# Patient Record
Sex: Female | Born: 1995 | Race: White | Hispanic: No | Marital: Married | Smoking: Current some day smoker
Health system: Southern US, Community
[De-identification: ages and names within clinical notes are randomized; demographics above are authoritative.]

## PROBLEM LIST (undated history)

## (undated) ENCOUNTER — Inpatient Hospital Stay: Payer: Self-pay

## (undated) ENCOUNTER — Ambulatory Visit: Admission: EM | Source: Home / Self Care

## (undated) DIAGNOSIS — F1911 Other psychoactive substance abuse, in remission: Secondary | ICD-10-CM

## (undated) DIAGNOSIS — R519 Headache, unspecified: Secondary | ICD-10-CM

## (undated) DIAGNOSIS — J45909 Unspecified asthma, uncomplicated: Secondary | ICD-10-CM

## (undated) DIAGNOSIS — K219 Gastro-esophageal reflux disease without esophagitis: Secondary | ICD-10-CM

## (undated) DIAGNOSIS — R51 Headache: Secondary | ICD-10-CM

## (undated) DIAGNOSIS — F988 Other specified behavioral and emotional disorders with onset usually occurring in childhood and adolescence: Secondary | ICD-10-CM

## (undated) DIAGNOSIS — D649 Anemia, unspecified: Secondary | ICD-10-CM

---
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
## 2014-01-08 ENCOUNTER — Emergency Department: Payer: Self-pay | Admitting: Emergency Medicine

## 2014-01-08 LAB — URINALYSIS, COMPLETE
BLOOD: NEGATIVE
Bilirubin,UR: NEGATIVE
Glucose,UR: NEGATIVE mg/dL (ref 0–75)
Ketone: NEGATIVE
Leukocyte Esterase: NEGATIVE
Nitrite: NEGATIVE
Ph: 7 (ref 4.5–8.0)
Protein: NEGATIVE
RBC,UR: NONE SEEN /HPF (ref 0–5)
SPECIFIC GRAVITY: 1.006 (ref 1.003–1.030)
Squamous Epithelial: <1
WBC UR: 1 /HPF (ref 0–5)

## 2014-01-08 LAB — WET PREP, GENITAL

## 2014-01-09 LAB — GC/CHLAMYDIA PROBE AMP

## 2014-08-24 ENCOUNTER — Emergency Department: Payer: Self-pay | Admitting: Internal Medicine

## 2014-10-20 ENCOUNTER — Emergency Department
Admit: 2014-10-20 | Disposition: A | Discharge status: Home / Self Care | Payer: Self-pay | Admitting: Physician Assistant

## 2014-11-25 ENCOUNTER — Emergency Department: Payer: Managed Care, Other (non HMO)

## 2014-11-25 ENCOUNTER — Encounter: Payer: Self-pay | Admitting: *Deleted

## 2014-11-25 ENCOUNTER — Emergency Department
Admission: EM | Admit: 2014-11-25 | Discharge: 2014-11-25 | Disposition: A | Discharge status: Home / Self Care | Payer: Managed Care, Other (non HMO) | Attending: Emergency Medicine | Admitting: Emergency Medicine

## 2014-11-25 DIAGNOSIS — X58XXXA Exposure to other specified factors, initial encounter: Secondary | ICD-10-CM | POA: Diagnosis not present

## 2014-11-25 DIAGNOSIS — Y9389 Activity, other specified: Secondary | ICD-10-CM | POA: Diagnosis not present

## 2014-11-25 DIAGNOSIS — Z880 Allergy status to penicillin: Secondary | ICD-10-CM | POA: Insufficient documentation

## 2014-11-25 DIAGNOSIS — M546 Pain in thoracic spine: Secondary | ICD-10-CM | POA: Diagnosis present

## 2014-11-25 DIAGNOSIS — Y9289 Other specified places as the place of occurrence of the external cause: Secondary | ICD-10-CM | POA: Diagnosis not present

## 2014-11-25 DIAGNOSIS — Z720 Tobacco use: Secondary | ICD-10-CM | POA: Diagnosis not present

## 2014-11-25 DIAGNOSIS — J40 Bronchitis, not specified as acute or chronic: Secondary | ICD-10-CM | POA: Insufficient documentation

## 2014-11-25 DIAGNOSIS — Y998 Other external cause status: Secondary | ICD-10-CM | POA: Insufficient documentation

## 2014-11-25 DIAGNOSIS — M940 Chondrocostal junction syndrome [Tietze]: Secondary | ICD-10-CM | POA: Diagnosis not present

## 2014-11-25 DIAGNOSIS — S29012A Strain of muscle and tendon of back wall of thorax, initial encounter: Secondary | ICD-10-CM | POA: Insufficient documentation

## 2014-11-25 DIAGNOSIS — T148XXA Other injury of unspecified body region, initial encounter: Secondary | ICD-10-CM

## 2014-11-25 LAB — CBC WITH DIFFERENTIAL/PLATELET
Basophils Absolute: 0 10*3/uL (ref 0–0.1)
Basophils Relative: 0 %
EOS ABS: 0.1 10*3/uL (ref 0–0.7)
Eosinophils Relative: 1 %
HCT: 40.9 % (ref 35.0–47.0)
HEMOGLOBIN: 13.2 g/dL (ref 12.0–16.0)
Lymphocytes Relative: 29 %
Lymphs Abs: 3 10*3/uL (ref 1.0–3.6)
MCH: 27.6 pg (ref 26.0–34.0)
MCHC: 32.1 g/dL (ref 32.0–36.0)
MCV: 85.8 fL (ref 80.0–100.0)
MONO ABS: 0.9 10*3/uL (ref 0.2–0.9)
MONOS PCT: 9 %
NEUTROS PCT: 61 %
Neutro Abs: 6.3 10*3/uL (ref 1.4–6.5)
Platelets: 197 10*3/uL (ref 150–440)
RBC: 4.77 MIL/uL (ref 3.80–5.20)
RDW: 14.6 % — ABNORMAL HIGH (ref 11.5–14.5)
WBC: 10.4 10*3/uL (ref 3.6–11.0)

## 2014-11-25 LAB — URINALYSIS COMPLETE WITH MICROSCOPIC (ARMC ONLY)
BACTERIA UA: NONE SEEN
BILIRUBIN URINE: NEGATIVE
Glucose, UA: NEGATIVE mg/dL
Hgb urine dipstick: NEGATIVE
Ketones, ur: NEGATIVE mg/dL
Nitrite: NEGATIVE
PROTEIN: NEGATIVE mg/dL
Specific Gravity, Urine: 1.012 (ref 1.005–1.030)
pH: 6 (ref 5.0–8.0)

## 2014-11-25 LAB — COMPREHENSIVE METABOLIC PANEL
ALBUMIN: 4.4 g/dL (ref 3.5–5.0)
ALK PHOS: 67 U/L (ref 38–126)
ALT: 28 U/L (ref 14–54)
AST: 23 U/L (ref 15–41)
Anion gap: 7 (ref 5–15)
BILIRUBIN TOTAL: 0.2 mg/dL — AB (ref 0.3–1.2)
BUN: 12 mg/dL (ref 6–20)
CHLORIDE: 100 mmol/L — AB (ref 101–111)
CO2: 24 mmol/L (ref 22–32)
CREATININE: 0.83 mg/dL (ref 0.44–1.00)
Calcium: 8.5 mg/dL — ABNORMAL LOW (ref 8.9–10.3)
GLUCOSE: 75 mg/dL (ref 65–99)
POTASSIUM: 3.6 mmol/L (ref 3.5–5.1)
Sodium: 131 mmol/L — ABNORMAL LOW (ref 135–145)
Total Protein: 7.6 g/dL (ref 6.5–8.1)

## 2014-11-25 LAB — POCT PREGNANCY, URINE: Preg Test, Ur: NEGATIVE

## 2014-11-25 LAB — FIBRIN DERIVATIVES D-DIMER (ARMC ONLY): FIBRIN DERIVATIVES D-DIMER (ARMC): 164 (ref 0–499)

## 2014-11-25 LAB — TROPONIN I: Troponin I: <0.03 ng/mL (ref <0.031)

## 2014-11-25 MED ORDER — BENZONATATE 100 MG PO CAPS: 100.0000 mg | ORAL_CAPSULE | Freq: Three times a day (TID) | ORAL | Status: DC | PRN

## 2014-11-25 MED ORDER — PREDNISONE 20 MG PO TABS: 40.0000 mg | ORAL_TABLET | Freq: Every day | ORAL | Status: DC

## 2014-11-25 MED ORDER — ALBUTEROL SULFATE HFA 108 (90 BASE) MCG/ACT IN AERS: 2.0000 | INHALATION_SPRAY | RESPIRATORY_TRACT | Status: DC | PRN

## 2014-11-25 MED ORDER — CYCLOBENZAPRINE HCL 10 MG PO TABS
ORAL_TABLET | ORAL | Status: AC
  Administered 2014-11-25: 10 mg via ORAL
  Filled 2014-11-25: qty 1

## 2014-11-25 MED ORDER — KETOROLAC TROMETHAMINE 30 MG/ML IJ SOLN
INTRAMUSCULAR | Status: AC
  Administered 2014-11-25: 30 mg via INTRAVENOUS
  Filled 2014-11-25: qty 1

## 2014-11-25 MED ORDER — CYCLOBENZAPRINE HCL 10 MG PO TABS
10.0000 mg | ORAL_TABLET | Freq: Once | ORAL | Status: AC
  Administered 2014-11-25: 10 mg via ORAL

## 2014-11-25 MED ORDER — CYCLOBENZAPRINE HCL 10 MG PO TABS: 10.0000 mg | ORAL_TABLET | Freq: Three times a day (TID) | ORAL | Status: DC | PRN

## 2014-11-25 MED ORDER — SODIUM CHLORIDE 0.9 % IV SOLN
Freq: Once | INTRAVENOUS | Status: AC
  Administered 2014-11-25: 21:00:00 via INTRAVENOUS

## 2014-11-25 MED ORDER — KETOROLAC TROMETHAMINE 30 MG/ML IJ SOLN
30.0000 mg | Freq: Once | INTRAMUSCULAR | Status: AC
  Administered 2014-11-25: 30 mg via INTRAVENOUS

## 2014-11-25 NOTE — ED Provider Notes (Signed)
<  ECG>  EKG with normal sinus rhythm, normal axis normal intervals, no evidence of hypertrophy or acute infarction pattern  Emily FilbertJonathan E Williams, MD 11/25/14 2032

## 2014-11-25 NOTE — Discharge Instructions (Signed)
Take medication as prescribed. At rest. Drink plenty of water.  Follow-up with her primary care physician or the above this week.  Return to the ER for new or worsening concerns.  Costochondritis Costochondritis, sometimes called Tietze syndrome, is a swelling and irritation (inflammation) of the tissue (cartilage) that connects your ribs with your breastbone (sternum). It causes pain in the chest and rib area. Costochondritis usually goes away on its own over time. It can take up to 6 weeks or longer to get better, especially if you are unable to limit your activities. CAUSES  Some cases of costochondritis have no known cause. Possible causes include:  Injury (trauma).  Exercise or activity such as lifting.  Severe coughing. SIGNS AND SYMPTOMS  Pain and tenderness in the chest and rib area.  Pain that gets worse when coughing or taking deep breaths.  Pain that gets worse with specific movements. DIAGNOSIS  Your health care provider will do a physical exam and ask about your symptoms. Chest X-rays or other tests may be done to rule out other problems. TREATMENT  Costochondritis usually goes away on its own over time. Your health care provider may prescribe medicine to help relieve pain. HOME CARE INSTRUCTIONS   Avoid exhausting physical activity. Try not to strain your ribs during normal activity. This would include any activities using chest, abdominal, and side muscles, especially if heavy weights are used.  Apply ice to the affected area for the first 2 days after the pain begins.  Put ice in a plastic bag.  Place a towel between your skin and the bag.  Leave the ice on for 20 minutes, 2-3 times a day.  Only take over-the-counter or prescription medicines as directed by your health care provider. SEEK MEDICAL CARE IF:  You have redness or swelling at the rib joints. These are signs of infection.  Your pain does not go away despite rest or medicine. SEEK IMMEDIATE  MEDICAL CARE IF:   Your pain increases or you are very uncomfortable.  You have shortness of breath or difficulty breathing.  You cough up blood.  You have worse chest pains, sweating, or vomiting.  You have a fever or persistent symptoms for more than 2-3 days.  You have a fever and your symptoms suddenly get worse. MAKE SURE YOU:   Understand these instructions.  Will watch your condition.  Will get help right away if you are not doing well or get worse. Document Released: 03/24/2005 Document Revised: 04/04/2013 Document Reviewed: 01/16/2013 [facility] Patient Information 2015 [place], [place]. This information is not intended to replace advice given to you by your health care provider. Make sure you discuss any questions you have with your health care provider.

## 2014-11-25 NOTE — ED Notes (Signed)
Pt reports about 1 hour ago she was brushing her hair and started having onset of  pain in her mid back area, pain worse to movement. Not meds taken prior to arrival.

## 2014-11-25 NOTE — ED Notes (Signed)
Has had cough for last couple of weeks. Hurts worse to take a deep breath.
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
## 2014-11-28 LAB — URINE CULTURE

## 2014-12-16 ENCOUNTER — Emergency Department
Admission: EM | Admit: 2014-12-16 | Discharge: 2014-12-16 | Disposition: A | Discharge status: Home / Self Care | Payer: Managed Care, Other (non HMO) | Attending: Emergency Medicine | Admitting: Emergency Medicine

## 2014-12-16 ENCOUNTER — Encounter: Payer: Self-pay | Admitting: Emergency Medicine

## 2014-12-16 DIAGNOSIS — Z880 Allergy status to penicillin: Secondary | ICD-10-CM | POA: Insufficient documentation

## 2014-12-16 DIAGNOSIS — Y998 Other external cause status: Secondary | ICD-10-CM | POA: Insufficient documentation

## 2014-12-16 DIAGNOSIS — W57XXXA Bitten or stung by nonvenomous insect and other nonvenomous arthropods, initial encounter: Secondary | ICD-10-CM | POA: Insufficient documentation

## 2014-12-16 DIAGNOSIS — Z720 Tobacco use: Secondary | ICD-10-CM | POA: Diagnosis not present

## 2014-12-16 DIAGNOSIS — Y9389 Activity, other specified: Secondary | ICD-10-CM | POA: Insufficient documentation

## 2014-12-16 DIAGNOSIS — S70361A Insect bite (nonvenomous), right thigh, initial encounter: Secondary | ICD-10-CM | POA: Insufficient documentation

## 2014-12-16 DIAGNOSIS — Y9289 Other specified places as the place of occurrence of the external cause: Secondary | ICD-10-CM | POA: Diagnosis not present

## 2014-12-16 DIAGNOSIS — Z7952 Long term (current) use of systemic steroids: Secondary | ICD-10-CM | POA: Diagnosis not present

## 2014-12-16 MED ORDER — MUPIROCIN 2 % EX OINT: 1.0000 "application " | TOPICAL_OINTMENT | Freq: Two times a day (BID) | CUTANEOUS | Status: DC

## 2014-12-16 MED ORDER — IBUPROFEN 800 MG PO TABS
ORAL_TABLET | ORAL | Status: AC
  Administered 2014-12-16: 800 mg via ORAL
  Filled 2014-12-16: qty 1

## 2014-12-16 MED ORDER — IBUPROFEN 800 MG PO TABS
800.0000 mg | ORAL_TABLET | Freq: Once | ORAL | Status: AC
  Administered 2014-12-16: 800 mg via ORAL

## 2014-12-16 MED ORDER — BACITRACIN 500 UNIT/GM EX OINT
1.0000 "application " | TOPICAL_OINTMENT | Freq: Two times a day (BID) | CUTANEOUS | Status: DC
  Administered 2014-12-16: 1 via TOPICAL

## 2014-12-16 MED ORDER — BACITRACIN ZINC 500 UNIT/GM EX OINT
TOPICAL_OINTMENT | CUTANEOUS | Status: AC
  Administered 2014-12-16: 1 via TOPICAL
  Filled 2014-12-16: qty 0.9

## 2014-12-16 MED ORDER — IBUPROFEN 800 MG PO TABS: 800.0000 mg | ORAL_TABLET | Freq: Three times a day (TID) | ORAL | Status: DC | PRN

## 2014-12-16 MED ORDER — TRAMADOL HCL 50 MG PO TABS: 50.0000 mg | ORAL_TABLET | Freq: Four times a day (QID) | ORAL | Status: DC | PRN

## 2014-12-16 NOTE — ED Notes (Signed)
Pt states had zit on right leg two days ago and popped it.  Pt presents with open blister to medial right thigh above knee.  Pt states burning pain.  No swelling, firmness, warmth, or drainage from site.

## 2014-12-16 NOTE — ED Provider Notes (Signed)
[facility] Emergency Department Provider Note  ____________________________________________  Time seen: Approximately 9:10 PM  I have reviewed the triage vital signs and the nursing notes.   HISTORY  Chief Complaint Insect Bite    HPI Beth Willis is a 19 y.o. female arrives today with what she thinks was a bug bite to her right thigh states that she popped it now the area is red and incredibly tender in burning rates as about 8 out of 10 pain nothing making it particularly better or worse it just burns denies fever chills drainage from the area no other complaints at this time   History reviewed. No pertinent past medical history.  There are no active problems to display for this patient.   History reviewed. No pertinent past surgical history.  Current Outpatient Rx  Name  Route  Sig  Dispense  Refill  . albuterol (PROVENTIL HFA;VENTOLIN HFA) 108 (90 BASE) MCG/ACT inhaler   Inhalation   Inhale 2 puffs into the lungs every 4 (four) hours as needed for wheezing or shortness of breath.   1 Inhaler   0   . benzonatate (TESSALON PERLES) 100 MG capsule   Oral   Take 1 capsule (100 mg total) by mouth 3 (three) times daily as needed for cough.   15 capsule   0   . cyclobenzaprine (FLEXERIL) 10 MG tablet   Oral   Take 1 tablet (10 mg total) by mouth every 8 (eight) hours as needed for muscle spasms (PRN pain. Do not drive or operate heavy machinery while taking as can cause drowsiness.).   12 tablet   0   . ibuprofen (ADVIL,MOTRIN) 800 MG tablet   Oral   Take 1 tablet (800 mg total) by mouth every 8 (eight) hours as needed.   30 tablet   0   . mupirocin ointment (BACTROBAN) 2 %   Nasal   Place 1 application into the nose 2 (two) times daily.   22 g   0   . predniSONE (DELTASONE) 20 MG tablet   Oral   Take 2 tablets (40 mg total) by mouth daily.   10 tablet   0   . traMADol (ULTRAM) 50 MG tablet   Oral   Take 1 tablet (50 mg total)  by mouth every 6 (six) hours as needed.   10 tablet   0     Allergies Amoxicillin and Penicillins  No family history on file.  Social History History  Substance Use Topics  . Smoking status: Current Every Day Smoker -- 0.50 packs/day    Types: Cigarettes  . Smokeless tobacco: Not on file  . Alcohol Use: Yes    Review of Systems Constitutional: No fever/chills Eyes: No visual changes. ENT: No sore throat. Cardiovascular: Denies chest pain. Respiratory: Denies shortness of breath. Gastrointestinal: No abdominal pain.  No nausea, no vomiting.  No diarrhea.  No constipation. Genitourinary: Negative for dysuria. Musculoskeletal: Negative for back pain. Skin: Negative for rash. Neurological: Negative for headaches, focal weakness or numbness.  10-point ROS otherwise negative.  ____________________________________________   PHYSICAL EXAM:  VITAL SIGNS: ED Triage Vitals  Enc Vitals Group     BP 12/16/14 1938 137/91 mmHg     Pulse Rate 12/16/14 1938 113     Resp 12/16/14 1938 20     Temp 12/16/14 1938 98.4 F (36.9 C)     Temp Source 12/16/14 1938 Oral     SpO2 12/16/14 1938 100 %  Weight 12/16/14 1938 180 lb (81.647 kg)     Height 12/16/14 1938 5\' 7"  (1.702 m)     Head Cir --      Peak Flow --      Pain Score 12/16/14 1939 8     Pain Loc --      Pain Edu? --      Excl. in GC? --     Constitutional: Alert and oriented. Well appearing and in no acute distress. Eyes: Conjunctivae are normal. PERRL. EOMI. Head: Atraumatic. Nose: No congestion/rhinnorhea. Mouth/Throat: Mucous membranes are moist.  Oropharynx non-erythematous. Neck: No stridor.   Cardiovascular: Normal rate, regular rhythm. Grossly normal heart sounds.  Good peripheral circulation. Respiratory: Normal respiratory effort.  No retractions. Lungs CTAB. Musculoskeletal: No lower extremity tenderness nor edema.  No joint effusions. Neurologic:  Normal speech and language. No gross focal neurologic  deficits are appreciated. Speech is normal. No gait instability. Skin:  Has what looks like a superficial ulcerative lesion with surrounding erythema to her right inner thigh Psychiatric: Mood and affect are normal. Speech and behavior are normal.  ____________________________________________      PROCEDURES  Procedure(s) performed: None  Critical Care performed: No  ____________________________________________   INITIAL IMPRESSION / ASSESSMENT AND PLAN / ED COURSE  Pertinent labs & imaging results that were available during my care of the patient were reviewed by me and considered in my medical decision making (see chart for details).  Initial impression on this patient insect bite there is localized erythema and is tender but there is no focal abscess no spreading cellulitis there doesn't appear to be any depth to the lesion recommend that she treated with antibiotic ointment follow-up with the ER as needed return if any acute concerns or worsening symptoms ____________________________________________   FINAL CLINICAL IMPRESSION(S) / ED DIAGNOSES  Final diagnoses:  Insect bite      Jaeveon Ashland Rosalyn Gess, PA-C 12/16/14 2125  Emily Filbert, MD 12/16/14 2142

## 2014-12-16 NOTE — ED Notes (Signed)
Patient ambulatory to triage with steady gait, without difficulty or distress noted; pt reports ?spider bite to inner right thigh noted 2 days ago small shallow ulceration noted which pt reported started out as a pimple that she popped

## 2017-03-24 ENCOUNTER — Emergency Department
Admission: EM | Admit: 2017-03-24 | Discharge: 2017-03-24 | Disposition: A | Discharge status: Home / Self Care | Payer: Managed Care, Other (non HMO) | Attending: Student in an Organized Health Care Education/Training Program | Admitting: Student in an Organized Health Care Education/Training Program

## 2017-03-24 ENCOUNTER — Encounter: Payer: Self-pay | Admitting: Emergency Medicine

## 2017-03-24 DIAGNOSIS — R1030 Lower abdominal pain, unspecified: Secondary | ICD-10-CM | POA: Diagnosis not present

## 2017-03-24 DIAGNOSIS — F17210 Nicotine dependence, cigarettes, uncomplicated: Secondary | ICD-10-CM | POA: Insufficient documentation

## 2017-03-24 DIAGNOSIS — Z79899 Other long term (current) drug therapy: Secondary | ICD-10-CM | POA: Insufficient documentation

## 2017-03-24 DIAGNOSIS — R51 Headache: Secondary | ICD-10-CM | POA: Diagnosis not present

## 2017-03-24 DIAGNOSIS — R112 Nausea with vomiting, unspecified: Secondary | ICD-10-CM | POA: Diagnosis not present

## 2017-03-24 LAB — URINALYSIS, COMPLETE (UACMP) WITH MICROSCOPIC
BILIRUBIN URINE: NEGATIVE
Bacteria, UA: NONE SEEN
GLUCOSE, UA: NEGATIVE mg/dL
HGB URINE DIPSTICK: NEGATIVE
KETONES UR: NEGATIVE mg/dL
Leukocytes, UA: NEGATIVE
Nitrite: NEGATIVE
Protein, ur: NEGATIVE mg/dL
RBC / HPF: NONE SEEN RBC/hpf (ref 0–5)
SPECIFIC GRAVITY, URINE: 1.016 (ref 1.005–1.030)
pH: 5 (ref 5.0–8.0)

## 2017-03-24 LAB — COMPREHENSIVE METABOLIC PANEL
ALBUMIN: 4.5 g/dL (ref 3.5–5.0)
ALK PHOS: 53 U/L (ref 38–126)
ALT: 19 U/L (ref 14–54)
AST: 21 U/L (ref 15–41)
Anion gap: 9 (ref 5–15)
BILIRUBIN TOTAL: 0.2 mg/dL — AB (ref 0.3–1.2)
BUN: 9 mg/dL (ref 6–20)
CALCIUM: 9.3 mg/dL (ref 8.9–10.3)
CO2: 22 mmol/L (ref 22–32)
CREATININE: 0.7 mg/dL (ref 0.44–1.00)
Chloride: 106 mmol/L (ref 101–111)
GFR calc Af Amer: >60 mL/min (ref >60)
GFR calc non Af Amer: >60 mL/min (ref >60)
GLUCOSE: 95 mg/dL (ref 65–99)
Potassium: 3.7 mmol/L (ref 3.5–5.1)
Sodium: 137 mmol/L (ref 135–145)
TOTAL PROTEIN: 7.5 g/dL (ref 6.5–8.1)

## 2017-03-24 LAB — CBC
HCT: 40.3 % (ref 35.0–47.0)
Hemoglobin: 14 g/dL (ref 12.0–16.0)
MCH: 29.5 pg (ref 26.0–34.0)
MCHC: 34.7 g/dL (ref 32.0–36.0)
MCV: 85 fL (ref 80.0–100.0)
PLATELETS: 194 10*3/uL (ref 150–440)
RBC: 4.74 MIL/uL (ref 3.80–5.20)
RDW: 14.4 % (ref 11.5–14.5)
WBC: 9.2 10*3/uL (ref 3.6–11.0)

## 2017-03-24 LAB — LIPASE, BLOOD: Lipase: 22 U/L (ref 11–51)

## 2017-03-24 LAB — HCG, QUANTITATIVE, PREGNANCY

## 2017-03-24 LAB — PREGNANCY, URINE: PREG TEST UR: NEGATIVE

## 2017-03-24 MED ORDER — PROMETHAZINE HCL 12.5 MG PO TABS: 12.5000 mg | ORAL_TABLET | Freq: Four times a day (QID) | ORAL | 0 refills | Status: DC | PRN

## 2017-03-24 MED ORDER — PROMETHAZINE HCL 25 MG/ML IJ SOLN
12.5000 mg | Freq: Four times a day (QID) | INTRAMUSCULAR | Status: DC | PRN
  Administered 2017-03-24: 12.5 mg via INTRAMUSCULAR

## 2017-03-24 MED ORDER — PROMETHAZINE HCL 25 MG/ML IJ SOLN
INTRAMUSCULAR | Status: AC
  Filled 2017-03-24: qty 1

## 2017-03-24 NOTE — ED Triage Notes (Signed)
Pt reports she is 9 days late on her period, has taken two tests, both were negative but states she has all the symptoms of pregnancy. When asked what symptoms pt reports nausea and vomiting for two days, low back and lower abdominal pain for two days, headache and insomnia. Pt states the tests she took were only faintly negative.

## 2017-03-24 NOTE — ED Notes (Signed)
Patient comes in complaining of not starting menses and reports being 8 days late.  Pregnancy test at home per patient have been negative.

## 2017-03-24 NOTE — Discharge Instructions (Signed)

## 2017-03-24 NOTE — ED Notes (Signed)
Called for patient X 3.

## 2017-03-24 NOTE — ED Provider Notes (Signed)
[facility] Emergency Department Provider Note    First MD Initiated Contact with Patient 03/24/17 1639     (approximate)  I have reviewed the triage vital signs and the nursing notes.   HISTORY  Chief Complaint Emesis and Abdominal Pain    HPI Beth Willis is a 21 y.o. female presents with chief complaint of nausea and decreased oral intake with concern for being week late on her period with 2 negative home pregnancy test. Denies any fevers. No chest pains. States she is also having trouble sleeping and a mild headache but has had similar symptoms to this in the past. No neck stiffness. No numbness or tingling. No chest pain or shortness of breath. No rashes. No vaginal discharge. No dysuria.   History reviewed. No pertinent past medical history. No family history on file. No past surgical history on file. There are no active problems to display for this patient.     Prior to Admission medications   Medication Sig Start Date End Date Taking? Authorizing Provider  albuterol (PROVENTIL HFA;VENTOLIN HFA) 108 (90 BASE) MCG/ACT inhaler Inhale 2 puffs into the lungs every 4 (four) hours as needed for wheezing or shortness of breath. 11/25/14   Renford Dills, NP  benzonatate (TESSALON PERLES) 100 MG capsule Take 1 capsule (100 mg total) by mouth 3 (three) times daily as needed for cough. 11/25/14   Renford Dills, NP  cyclobenzaprine (FLEXERIL) 10 MG tablet Take 1 tablet (10 mg total) by mouth every 8 (eight) hours as needed for muscle spasms (PRN pain. Do not drive or operate heavy machinery while taking as can cause drowsiness.). 11/25/14   Renford Dills, NP  ibuprofen (ADVIL,MOTRIN) 800 MG tablet Take 1 tablet (800 mg total) by mouth every 8 (eight) hours as needed. 12/16/14   Ruffian, III Kristine Garbe, PA-C  mupirocin ointment (BACTROBAN) 2 % Place 1 application into the nose 2 (two) times daily. 12/16/14   Ruffian, III Kristine Garbe, PA-C  predniSONE  (DELTASONE) 20 MG tablet Take 2 tablets (40 mg total) by mouth daily. 11/25/14   Renford Dills, NP  promethazine (PHENERGAN) 12.5 MG tablet Take 1 tablet (12.5 mg total) by mouth every 6 (six) hours as needed for nausea or vomiting. 03/24/17   Willy Eddy, MD  traMADol (ULTRAM) 50 MG tablet Take 1 tablet (50 mg total) by mouth every 6 (six) hours as needed. 12/16/14   Garrel Ridgel, PA-C    Allergies Amoxicillin and Penicillins    Social History Social History  Substance Use Topics  . Smoking status: Current Every Day Smoker    Packs/day: 0.50    Types: Cigarettes  . Smokeless tobacco: Not on file  . Alcohol use Yes    Review of Systems Patient denies headaches, rhinorrhea, blurry vision, numbness, shortness of breath, chest pain, edema, cough, abdominal pain, nausea, vomiting, diarrhea, dysuria, fevers, rashes or hallucinations unless otherwise stated above in HPI. ____________________________________________   PHYSICAL EXAM:  VITAL SIGNS: Vitals:   03/24/17 1604  BP: 121/78  Pulse: 87  Resp: 18  Temp: 98.3 F (36.8 C)  SpO2: 98%    Constitutional: Alert and oriented. Well appearing and in no acute distress. Eyes: Conjunctivae are normal.  Head: Atraumatic. Nose: No congestion/rhinnorhea. Mouth/Throat: Mucous membranes are moist.   Neck: No stridor. Painless ROM.  Cardiovascular: Normal rate, regular rhythm. Grossly normal heart sounds.  Good peripheral circulation. Respiratory: Normal respiratory effort.  No retractions. Lungs CTAB. Gastrointestinal: Soft and nontender. No distention. No  abdominal bruits. No CVA tenderness. Genitourinary: defferred Musculoskeletal: No lower extremity tenderness nor edema.  No joint effusions. Neurologic:  Normal speech and language. No gross focal neurologic deficits are appreciated. No facial droop Skin:  Skin is warm, dry and intact. No rash noted. Psychiatric: Mood and affect are normal. Speech and behavior are  normal.  ____________________________________________   LABS (all labs ordered are listed, but only abnormal results are displayed)  Results for orders placed or performed during the hospital encounter of 03/24/17 (from the past 24 hour(s))  Lipase, blood     Status: None   Collection Time: 03/24/17  4:06 PM  Result Value Ref Range   Lipase 22 11 - 51 U/L  Comprehensive metabolic panel     Status: Abnormal   Collection Time: 03/24/17  4:06 PM  Result Value Ref Range   Sodium 137 135 - 145 mmol/L   Potassium 3.7 3.5 - 5.1 mmol/L   Chloride 106 101 - 111 mmol/L   CO2 22 22 - 32 mmol/L   Glucose, Bld 95 65 - 99 mg/dL   BUN 9 6 - 20 mg/dL   Creatinine, Ser 1.61 0.44 - 1.00 mg/dL   Calcium 9.3 8.9 - 09.6 mg/dL   Total Protein 7.5 6.5 - 8.1 g/dL   Albumin 4.5 3.5 - 5.0 g/dL   AST 21 15 - 41 U/L   ALT 19 14 - 54 U/L   Alkaline Phosphatase 53 38 - 126 U/L   Total Bilirubin 0.2 (L) 0.3 - 1.2 mg/dL   GFR calc non Af Amer >60 >60 mL/min   GFR calc Af Amer >60 >60 mL/min   Anion gap 9 5 - 15  CBC     Status: None   Collection Time: 03/24/17  4:06 PM  Result Value Ref Range   WBC 9.2 3.6 - 11.0 K/uL   RBC 4.74 3.80 - 5.20 MIL/uL   Hemoglobin 14.0 12.0 - 16.0 g/dL   HCT 04.5 40.9 - 81.1 %   MCV 85.0 80.0 - 100.0 fL   MCH 29.5 26.0 - 34.0 pg   MCHC 34.7 32.0 - 36.0 g/dL   RDW 91.4 78.2 - 95.6 %   Platelets 194 150 - 440 K/uL  Urinalysis, Complete w Microscopic     Status: Abnormal   Collection Time: 03/24/17  4:06 PM  Result Value Ref Range   Color, Urine YELLOW (A) YELLOW   APPearance HAZY (A) CLEAR   Specific Gravity, Urine 1.016 1.005 - 1.030   pH 5.0 5.0 - 8.0   Glucose, UA NEGATIVE NEGATIVE mg/dL   Hgb urine dipstick NEGATIVE NEGATIVE   Bilirubin Urine NEGATIVE NEGATIVE   Ketones, ur NEGATIVE NEGATIVE mg/dL   Protein, ur NEGATIVE NEGATIVE mg/dL   Nitrite NEGATIVE NEGATIVE   Leukocytes, UA NEGATIVE NEGATIVE   RBC / HPF NONE SEEN 0 - 5 RBC/hpf   WBC, UA 0-5 0 - 5  WBC/hpf   Bacteria, UA NONE SEEN NONE SEEN   Squamous Epithelial / LPF 6-30 (A) NONE SEEN   Mucus PRESENT   hCG, quantitative, pregnancy     Status: None   Collection Time: 03/24/17  4:06 PM  Result Value Ref Range   hCG, Beta Chain, Quant, S <1 <5 mIU/mL  Pregnancy, urine     Status: None   Collection Time: 03/24/17  4:06 PM  Result Value Ref Range   Preg Test, Ur NEGATIVE NEGATIVE   ____________________________________________ _________________________________   PROCEDURES  Procedure(s) performed:  Procedures  Critical Care performed: no ____________________________________________   INITIAL IMPRESSION / ASSESSMENT AND PLAN / ED COURSE  Pertinent labs & imaging results that were available during my care of the patient were reviewed by me and considered in my medical decision making (see chart for details).  DDX: pregnancy, gastritris, torsion, cyst, uti, pid  [name] is a 21 y.o. who presents to the ED with above symptoms. Patient is AFVSS in ED. Exam as above. Given current presentation have considered the above differential.  She is well-appearing and well perfused. Her abdominal exam is soft and nontender in all 4 quadrants. Blood work sent for the above differential shows no evidence of dehydration. Despite her saying that she's not had anything A in 2 days she doesn't have any evidence of metabolic acidosis or significant ketosis affordably she's getting some nutrition. She is not pregnant and there is no evidence of infectious process. This is not clinically consistent with appendicitis, ovarian torsion or TOA. No signs or symptoms suggestive of PID.  Patient was able to tolerate PO and was able to ambulate with a steady gait.  Have discussed with the patient and available family all diagnostics and treatments performed thus far and all questions were answered to the best of my ability. The patient demonstrates understanding and agreement with plan.        ____________________________________________   FINAL CLINICAL IMPRESSION(S) / ED DIAGNOSES  Final diagnoses:  Non-intractable vomiting with nausea, unspecified vomiting type      NEW MEDICATIONS STARTED DURING THIS VISIT:  New Prescriptions   PROMETHAZINE (PHENERGAN) 12.5 MG TABLET    Take 1 tablet (12.5 mg total) by mouth every 6 (six) hours as needed for nausea or vomiting.     Note:  This document was prepared using Dragon voice recognition software and may include unintentional dictation errors.    Willy Eddy, MD 03/24/17 [phone]

## 2017-06-09 ENCOUNTER — Other Ambulatory Visit
Admission: AD | Admit: 2017-06-09 | Discharge: 2017-06-09 | Disposition: A | Discharge status: Home / Self Care | Payer: Managed Care, Other (non HMO) | Attending: Family Medicine | Admitting: Family Medicine

## 2017-06-09 NOTE — ED Notes (Signed)
Patient ambulatory to triage with steady gait, without difficulty or distress noted, in custody of [place] [employer] officer Norman Herrlich and Alroy Dust     for forensic blood draw; pt A&Ox3, with no c/o voiced and denies need to see ED provider; pt voices good understanding of blood draw to be performed for forensic testing; pt verifies identity with name and DOB; using sealed kit provided by officer, tourniquet applied to right upper arm; right antecubital region prepped with betadine swab and allowed to dry completely; needle inserted and 2 grey top blood tubes collected; tourniquet removed, needle removed & intact, dressing applied; tubes labeled, given to officer Alroy Dust and placed in sealed container using chain of custody; pt tolerated well and continues to deny c/o or need to see ED provider; pt d/c in police custody

## 2017-06-28 NOTE — L&D Delivery Note (Signed)
Operative Delivery Note At 4:13 PM a viable female was delivered via Vaginal, Forceps.  Presentation: vertex, ROA; Station: +2.  Verbal consent: obtained from patient.  Risks and benefits discussed in detail.  Risks include, but are not limited to the risks of anesthesia, bleeding, infection, damage to maternal tissues, fetal cephalhematoma.  There is also the risk of inability to effect vaginal delivery of the head, or shoulder dystocia that cannot be resolved by established maneuvers, leading to the need for emergency cesarean section.  APGAR: 8, 9; weight pending.   Placenta status: spontaneous, intact.   Cord:  Without complications.  Cord pH: N/A  Anesthesia:   Instruments: Long fenestrated elliots Episiotomy: None Lacerations: 1st degree Suture Repair: 3.0 monocrykl Est. Blood Loss (mL):   Mom to postpartum.  Baby to Couplet care / Skin to Skin.  Beth Willis 04/23/2018, 4:27 PM

## 2017-07-04 ENCOUNTER — Encounter: Payer: Self-pay | Admitting: Emergency Medicine

## 2017-07-04 ENCOUNTER — Emergency Department
Admission: EM | Admit: 2017-07-04 | Discharge: 2017-07-05 | Disposition: A | Discharge status: Home / Self Care | Payer: Managed Care, Other (non HMO) | Attending: Emergency Medicine | Admitting: Emergency Medicine

## 2017-07-04 DIAGNOSIS — Z79899 Other long term (current) drug therapy: Secondary | ICD-10-CM | POA: Insufficient documentation

## 2017-07-04 DIAGNOSIS — F17210 Nicotine dependence, cigarettes, uncomplicated: Secondary | ICD-10-CM | POA: Insufficient documentation

## 2017-07-04 DIAGNOSIS — L02214 Cutaneous abscess of groin: Secondary | ICD-10-CM | POA: Insufficient documentation

## 2017-07-04 DIAGNOSIS — L0291 Cutaneous abscess, unspecified: Secondary | ICD-10-CM

## 2017-07-04 DIAGNOSIS — L03314 Cellulitis of groin: Secondary | ICD-10-CM | POA: Insufficient documentation

## 2017-07-04 DIAGNOSIS — R1030 Lower abdominal pain, unspecified: Secondary | ICD-10-CM | POA: Diagnosis present

## 2017-07-04 NOTE — ED Triage Notes (Signed)
Pt reports enlarged bump on the right inside of groin as well as one at the nase of buttocks on the left side. Pt reports she has tried to "pop" them but has been unsuccessful and they have enlarged.

## 2017-07-05 MED ORDER — OXYCODONE-ACETAMINOPHEN 5-325 MG PO TABS
1.0000 | ORAL_TABLET | Freq: Once | ORAL | Status: AC
  Administered 2017-07-05: 1 via ORAL
  Filled 2017-07-05: qty 1

## 2017-07-05 MED ORDER — CLINDAMYCIN HCL 150 MG PO CAPS
300.0000 mg | ORAL_CAPSULE | Freq: Once | ORAL | Status: AC
  Administered 2017-07-05: 300 mg via ORAL
  Filled 2017-07-05: qty 2

## 2017-07-05 MED ORDER — TRAMADOL HCL 50 MG PO TABS: 50.0000 mg | ORAL_TABLET | Freq: Four times a day (QID) | ORAL | 0 refills | Status: DC | PRN

## 2017-07-05 MED ORDER — CLINDAMYCIN HCL 300 MG PO CAPS: 300.0000 mg | ORAL_CAPSULE | Freq: Three times a day (TID) | ORAL | 0 refills | Status: AC

## 2017-07-05 NOTE — ED Provider Notes (Signed)
[facility] Emergency Department Provider Note   ____________________________________________   First MD Initiated Contact with Patient 07/05/17 0009     (approximate)  I have reviewed the triage vital signs and the nursing notes.   HISTORY  Chief Complaint Abscess    HPI Beth Willis is a 22 y.o. female who comes into the hospital today with either a cyst or an ingrown hair in her groin bilaterally.  She reports that she tried to squeeze it but nothing came out.  It is no longer draining and she feels that there is some fluid in there so she was concerned and came in for evaluation.  She reports it was very painful tonight and she is unable to wear pants.  The patient states that she has had a boil in the past and it was treated with antibiotics.  The patient states that her pain is a 9 out of 10 in intensity.  She did not take anything for pain.  The patient is here today for evaluation.   History reviewed. No pertinent past medical history.  There are no active problems to display for this patient.   History reviewed. No pertinent surgical history.  Prior to Admission medications   Medication Sig Start Date End Date Taking? Authorizing Provider  albuterol (PROVENTIL HFA;VENTOLIN HFA) 108 (90 BASE) MCG/ACT inhaler Inhale 2 puffs into the lungs every 4 (four) hours as needed for wheezing or shortness of breath. 11/25/14   Renford DillsMiller, Lindsey, NP  benzonatate (TESSALON PERLES) 100 MG capsule Take 1 capsule (100 mg total) by mouth 3 (three) times daily as needed for cough. 11/25/14   Renford DillsMiller, Lindsey, NP  clindamycin (CLEOCIN) 300 MG capsule Take 1 capsule (300 mg total) by mouth 3 (three) times daily for 10 days. 07/05/17 07/15/17  Rebecka ApleyWebster, Lenell Lama P, MD  cyclobenzaprine (FLEXERIL) 10 MG tablet Take 1 tablet (10 mg total) by mouth every 8 (eight) hours as needed for muscle spasms (PRN pain. Do not drive or operate heavy machinery while taking as can cause  drowsiness.). 11/25/14   Renford DillsMiller, Lindsey, NP  ibuprofen (ADVIL,MOTRIN) 800 MG tablet Take 1 tablet (800 mg total) by mouth every 8 (eight) hours as needed. 12/16/14   Ruffian, III Kristine GarbeWilliam C, PA-C  mupirocin ointment (BACTROBAN) 2 % Place 1 application into the nose 2 (two) times daily. 12/16/14   Ruffian, III Kristine GarbeWilliam C, PA-C  predniSONE (DELTASONE) 20 MG tablet Take 2 tablets (40 mg total) by mouth daily. 11/25/14   Renford DillsMiller, Lindsey, NP  promethazine (PHENERGAN) 12.5 MG tablet Take 1 tablet (12.5 mg total) by mouth every 6 (six) hours as needed for nausea or vomiting. 03/24/17   Willy Eddyobinson, Patrick, MD  traMADol (ULTRAM) 50 MG tablet Take 1 tablet (50 mg total) by mouth every 6 (six) hours as needed. 12/16/14   Ruffian, III Kristine GarbeWilliam C, PA-C  traMADol (ULTRAM) 50 MG tablet Take 1 tablet (50 mg total) by mouth every 6 (six) hours as needed. 07/05/17   Rebecka ApleyWebster, Bayard More P, MD    Allergies Amoxicillin and Penicillins  History reviewed. No pertinent family history.  Social History Social History   Tobacco Use  . Smoking status: Current Every Day Smoker    Packs/day: 0.50    Types: Cigarettes  . Smokeless tobacco: Never Used  Substance Use Topics  . Alcohol use: Yes  . Drug use: No    Review of Systems  Constitutional: No fever/chills Eyes: No visual changes. ENT: No sore throat. Cardiovascular: Denies chest pain. Respiratory:  Denies shortness of breath. Gastrointestinal: No abdominal pain.  No nausea, no vomiting.  No diarrhea.  No constipation. Genitourinary: Negative for dysuria. Musculoskeletal: Negative for back pain. Skin: Boil to right inguinal area and left labia Neurological: Negative for headaches, focal weakness or numbness.   ____________________________________________   PHYSICAL EXAM:  VITAL SIGNS: ED Triage Vitals  Enc Vitals Group     BP 07/04/17 2256 131/90     Pulse Rate 07/04/17 2256 100     Resp 07/04/17 2256 17     Temp 07/04/17 2256 97.6 F (36.4 C)     Temp  Source 07/04/17 2256 Oral     SpO2 07/04/17 2256 98 %     Weight 07/04/17 2257 170 lb (77.1 kg)     Height --      Head Circumference --      Peak Flow --      Pain Score --      Pain Loc --      Pain Edu? --      Excl. in GC? --     Constitutional: Alert and oriented. Well appearing and in no acute distress. Eyes: Conjunctivae are normal. PERRL. EOMI. Head: Atraumatic. Nose: No congestion/rhinnorhea. Mouth/Throat: Mucous membranes are moist.  Oropharynx non-erythematous. Cardiovascular: Normal rate, regular rhythm. Grossly normal heart sounds.  Good peripheral circulation. Respiratory: Normal respiratory effort.  No retractions. Lungs CTAB. Gastrointestinal: Soft and nontender. No distention.  Genitourinary: Boil noted to right groin that is soft but not fluctuant with some mild induration and some excoriation at the top.  Boil also noted to the left labia with again some excoriation and induration and no significant fluctuance. Musculoskeletal: No lower extremity tenderness nor edema.   Neurologic:  Normal speech and language.  Skin:  Skin is warm, dry and intact.  Psychiatric: Mood and affect are normal.   ____________________________________________   LABS (all labs ordered are listed, but only abnormal results are displayed)  Labs Reviewed - No data to display ____________________________________________  EKG  none ____________________________________________  RADIOLOGY  No results found.  ____________________________________________   PROCEDURES  Procedure(s) performed: None  Procedures  Critical Care performed: No  ____________________________________________   INITIAL IMPRESSION / ASSESSMENT AND PLAN / ED COURSE  As part of my medical decision making, I reviewed the following data within the electronic MEDICAL RECORD NUMBER Notes from prior ED visits and [place] Controlled Substance Database   This is a 22 year old female who comes into the hospital today  with a boil to her right groin and left labia.  Differential diagnosis includes an abscess versus cellulitis.  Looking at the areas it is indurated but not significantly fluctuant.  The area is soft but again there is no distinct fluctuance.  There is also some significant areas of excoriation where the patient has been trying to pop the boil herself.  I do not feel that attempting to drain the area it would be beneficial as there does not feel to be significant amounts of fluid present.  I will give the patient a dose of Percocet as well as clindamycin and I will discharge her on antibiotics.  I did discuss with the patient that there is always a possibility that fluid may collect and it may need to be drained down the line but she should do some sits baths and warm compresses until then.  The patient will be discharged home.      ____________________________________________   FINAL CLINICAL IMPRESSION(S) / ED DIAGNOSES  Final diagnoses:  Cellulitis of groin  Abscess     ED Discharge Orders        Ordered    clindamycin (CLEOCIN) 300 MG capsule  3 times daily     07/05/17 0106    traMADol (ULTRAM) 50 MG tablet  Every 6 hours PRN     07/05/17 0106       Note:  This document was prepared using Dragon voice recognition software and may include unintentional dictation errors.    Rebecka Apley, MD 07/05/17 [phone]

## 2017-07-05 NOTE — ED Notes (Signed)
Pt has abscess to right groin area.  Sx for 2 days. Area red and tender to touch.  Pt also has abscess to left vaginal area.  No drainage.  Area red and tender.  Pt reports shaving pubic area and states now it is irritated.

## 2017-07-05 NOTE — Discharge Instructions (Signed)
Please return with any worsening swelling and worsening pain. PLease ensure that you are using warm compresses and SITZ baths at home

## 2017-08-26 ENCOUNTER — Ambulatory Visit: Payer: Self-pay | Admitting: Psychiatry

## 2017-09-07 ENCOUNTER — Ambulatory Visit (INDEPENDENT_AMBULATORY_CARE_PROVIDER_SITE_OTHER): Payer: Managed Care, Other (non HMO) | Admitting: Maternal Newborn

## 2017-09-07 ENCOUNTER — Encounter: Payer: Self-pay | Admitting: Maternal Newborn

## 2017-09-07 ENCOUNTER — Other Ambulatory Visit: Payer: Self-pay | Admitting: Advanced Practice Midwife

## 2017-09-07 VITALS — BP 118/76 | Wt 194.0 lb

## 2017-09-07 DIAGNOSIS — Z3493 Encounter for supervision of normal pregnancy, unspecified, third trimester: Secondary | ICD-10-CM | POA: Insufficient documentation

## 2017-09-07 DIAGNOSIS — Z0189 Encounter for other specified special examinations: Secondary | ICD-10-CM

## 2017-09-07 DIAGNOSIS — Z3401 Encounter for supervision of normal first pregnancy, first trimester: Secondary | ICD-10-CM

## 2017-09-07 DIAGNOSIS — Z369 Encounter for antenatal screening, unspecified: Secondary | ICD-10-CM

## 2017-09-07 LAB — OB RESULTS CONSOLE ABO/RH
RH TYPE: POSITIVE
RH Type: NEGATIVE
RH Type: NEGATIVE

## 2017-09-07 LAB — OB RESULTS CONSOLE PLATELET COUNT: Platelets: 210

## 2017-09-07 LAB — OB RESULTS CONSOLE VARICELLA ZOSTER ANTIBODY, IGG: Varicella: IMMUNE

## 2017-09-07 LAB — OB RESULTS CONSOLE HGB/HCT, BLOOD
HCT: 37
HEMOGLOBIN: 12.7

## 2017-09-07 LAB — OB RESULTS CONSOLE ANTIBODY SCREEN: Antibody Screen: NEGATIVE

## 2017-09-07 LAB — OB RESULTS CONSOLE HEPATITIS B SURFACE ANTIGEN: HEP B S AG: NEGATIVE

## 2017-09-07 LAB — OB RESULTS CONSOLE RPR: RPR: NONREACTIVE

## 2017-09-07 LAB — OB RESULTS CONSOLE TSH: TSH: 1.69

## 2017-09-07 NOTE — Progress Notes (Signed)
Pt is transferring care from [facility]; they have her scheduled c [facility] on the 21st 1:45 for u/s;  C/o migraines - adv 16oz caffeine and e.s. Tylenol q4hprn while awake; wants nuvaring pp.

## 2017-09-07 NOTE — Progress Notes (Signed)
09/07/2017   Chief Complaint: Amenorrhea, positive home pregnancy test, desires prenatal care.  Transfer of Care Patient: yes  History of Present Illness: [name] is a 22 y.o. G1P0 at 2366w5d based on Patient's last menstrual period on 06/17/2017 (exact date), with an Estimated Date of Delivery: 03/24/18, with the above CC.   Her periods were: regular periods monthly She was using no method when she conceived.  She has Positive signs or symptoms of nausea/vomiting of pregnancy. She has Negative signs or symptoms of miscarriage or preterm labor. She did have a 2 day period of light bleeding in January but this was not like her normal cycle and may have been implantation bleeding. She identifies Negative Zika risk factors for her and her partner On any different medications around the time she conceived/early pregnancy: No  History of varicella: No  Has been having headaches, advised Tylenol and offered Fioricet if desired.  ROS: A 12-point review of systems was performed and negative, except as stated in the above HPI.  OBGYN History: As per HPI. OB History  Gravida Para Term Preterm AB Living  2            SAB TAB Ectopic Multiple Live Births               # Outcome Date GA Lbr Len/2nd Weight Sex Delivery Anes PTL Lv  2 Current           1 Gravida               Any issues with any prior pregnancies: N/A Any prior children are healthy, doing well, without any problems or issues: not applicable History of pap smears: Yes. Last pap smear: reports one at [facility] during [facility]B exam, waiting for results History of STIs: No   Past Medical History: No past medical history on file.  Past Surgical History: Past Surgical History:  Procedure Laterality Date  . TONSILLECTOMY  2014   Pt not sure if addenoids taken  . WISDOM TOOTH EXTRACTION  2014    Family History:  Family History  Problem Relation Age of Onset  . Hypertension Mother   . Hypertension Father   .  Hypertension Maternal Grandmother   . Diabetes Maternal Grandmother   . Thyroid disease Maternal Grandmother   . Hypertension Maternal Grandfather   . Thyroid disease Maternal Grandfather   . Hypertension Paternal Grandmother   . Thyroid disease Paternal Grandmother   . Hypertension Paternal Grandfather   . Thyroid disease Paternal Grandfather   . Hypertension Maternal Aunt   . Hypertension Maternal Uncle   . Hypertension Paternal Aunt   . Diabetes Paternal Aunt   . Hypertension Paternal Uncle    She denies any female cancers, bleeding or blood clotting disorders.  She reports a half brother with autism and FOB has a cousin with Down's syndrome.  No other history of intellectual disability, birth defects or genetic disorders in her or the FOB's history  Social History:  Social History   Socioeconomic History  . Marital status: Single    Spouse name: Not on file  . Number of children: 0  . Years of education: 4912  . Highest education level: Not on file  Social Needs  . Financial resource strain: Not on file  . Food insecurity - worry: Not on file  . Food insecurity - inability: Not on file  . Transportation needs - medical: Not on file  . Transportation needs - non-medical: Not on file  Occupational History  . Occupation: Conservation officer, [employer]    Comment: Actor  . Occupation: call center  Tobacco Use  . Smoking status: Current Every Day Smoker    Packs/day: 0.50    Types: Cigarettes  . Smokeless tobacco: Never Used  Substance and Sexual Activity  . Alcohol use: No    Frequency: Never  . Drug use: No    Comment: former  . Sexual activity: Yes    Birth control/protection: None  Other Topics Concern  . Not on file  Social History Narrative  . Not on file   Any cats in the household: no Denies history of and current domestic violence.  Allergy: Allergies  Allergen Reactions  . Amoxicillin   . Penicillins     Current Outpatient Medications:  Current Outpatient  Medications:  .  neomycin-polymyxin-hydrocortisone (CORTISPORIN) 3.5-10000-1 OTIC suspension, Place 4 drops into both ears 3 (three) times daily., Disp: , Rfl: 0   Physical Exam:   BP 118/76   Wt 194 lb (88 kg)   LMP 06/17/2017 (Exact Date)   BMI 33.30 kg/m  Body mass index is 33.3 kg/m. Constitutional: Well nourished, well developed female in no acute distress.  Neck:  Supple, normal appearance, and no thyromegaly  Cardiovascular: S1, S2 normal, no murmur, rub or gallop, regular rate and rhythm Respiratory:  Clear to auscultation bilateral.ly Normal respiratory effort Abdomen: no masses, hernias; diffusely non tender to palpation, non distended Breasts: patient declines to have breast exam. Neuro/Psych:  Normal mood and affect.  Skin:  Warm and dry.    Declined pelvic exam as she just had a normal exam yesterday at [facility].  Assessment: Beth Willis is a 22 y.o. G1P0 [redacted]w[redacted]d based on Patient's last menstrual period on 06/17/2017 (exact date), with an Estimated Date of Delivery: 03/24/18, presenting for prenatal care.  Plan:  1) Avoid alcoholic beverages. 2) Patient encouraged not to smoke. She states that she has recently quit, formerly smoked 0.5 pack per day. 3) Discontinue the use of all non-medicinal drugs and chemicals.  4) Take prenatal vitamins daily.  5) Seatbelt use advised 6) Nutrition, food safety (fish, cheese advisories, and high nitrite foods) and exercise discussed. 7) Hospital and practice style delivering at [facility] discussed  8) Patient is asked about travel to areas at risk for the Zika virus, and counseled to avoid travel and exposure to mosquitoes or sexual partners who may have themselves been exposed to the virus. Testing is discussed, and will be ordered as appropriate.  9) Childbirth classes at [facility] advised 10) Genetic Screening, such as with 1st Trimester Screening, cell free fetal DNA, AFP testing, and Ultrasound, as well as with amniocentesis and CVS as  appropriate, is discussed with patient. She plans to have genetic testing this pregnancy. 11) Waiting for prenatal records from [facility] to see if any NOB testing needed, patient states she had Pap and early GTT at that visit. 12) Ordered ultrasound for dating and viability.  Problem list reviewed and updated.  Return in about 1 day (around 09/08/2017) for ROB following ultrasound.  Marcelyn Bruins, CNM [facility] 09/07/2017  3:17 PM

## 2017-09-08 LAB — OB RESULTS CONSOLE GC/CHLAMYDIA
CHLAMYDIA, DNA PROBE: NEGATIVE
GC PROBE AMP, GENITAL: NEGATIVE

## 2017-09-10 ENCOUNTER — Encounter: Payer: Self-pay | Admitting: Maternal Newborn

## 2017-09-13 ENCOUNTER — Ambulatory Visit (INDEPENDENT_AMBULATORY_CARE_PROVIDER_SITE_OTHER): Payer: Managed Care, Other (non HMO) | Admitting: Maternal Newborn

## 2017-09-13 ENCOUNTER — Ambulatory Visit (INDEPENDENT_AMBULATORY_CARE_PROVIDER_SITE_OTHER): Payer: Managed Care, Other (non HMO)

## 2017-09-13 ENCOUNTER — Encounter: Payer: Self-pay | Admitting: Maternal Newborn

## 2017-09-13 VITALS — BP 130/80 | Wt 194.0 lb

## 2017-09-13 DIAGNOSIS — Z0189 Encounter for other specified special examinations: Secondary | ICD-10-CM

## 2017-09-13 DIAGNOSIS — Z3401 Encounter for supervision of normal first pregnancy, first trimester: Secondary | ICD-10-CM

## 2017-09-13 DIAGNOSIS — Z3A08 8 weeks gestation of pregnancy: Secondary | ICD-10-CM

## 2017-09-13 MED ORDER — PRENATE MINI 18-0.6-0.4-350 MG PO CAPS: 1.0000 | ORAL_CAPSULE | Freq: Every day | ORAL | 8 refills | Status: DC

## 2017-09-13 NOTE — Progress Notes (Signed)
    Routine Prenatal Care Visit  Subjective  Beth Willis is a 22 y.o. G1P0 at 3177w2d being seen today for ongoing prenatal care.  She is currently monitored for the following issues for this low-risk pregnancy and has Encounter for supervision of normal first pregnancy in first trimester on their problem list.  ----------------------------------------------------------------------------------- Patient reports nausea. Good appetite and able to eat later in the day.  Vag. Bleeding: None.   ----------------------------------------------------------------------------------- The following portions of the patient's history were reviewed and updated as appropriate: allergies, current medications, past family history, past medical history, past social history, past surgical history and problem list. Problem list updated.   Objective  Last menstrual period 06/17/2017. Pregravid weight 170 lb (77.1 kg) Total Weight Gain 24 lb (10.9 kg) Urinalysis: Unable to void Fetal Status: Fetal Heart Rate (bpm): 169         General:  Alert, oriented and cooperative. Patient is in no acute distress.  Skin: Skin is warm and dry. No rash noted.   Cardiovascular: Normal heart rate noted  Respiratory: Normal respiratory effort, no problems with respiration noted  Abdomen: Soft, gravid, appropriate for gestational age. Pain/Pressure: Absent     Pelvic:  Cervical exam deferred        Extremities: Normal range of motion.     Mental Status: Normal mood and affect. Normal behavior. Normal judgment and thought content.     Assessment   22 y.o. G1P0 at 1677w2d, EDD 04/23/2018 by Ultrasound presenting for routine prenatal visit.  Plan   FIRST Problems (from 09/07/17 to present)    Problem Noted Resolved   Encounter for supervision of normal first pregnancy in first trimester 09/07/2017 by Oswaldo ConroySchmid, [place] Sarinana Y, CNM No   Overview Signed 09/07/2017  3:16 PM by Oswaldo ConroySchmid, [place] Y, CNM    Clinic [facility]    Dating  Blood type:     Genetic Screen 1 Screen:    AFP:     Quad:     NIPS: Antibody:   Anatomic US  Rubella:   Varicella:    GTT Early:               Third trimester:  RPR:     Rhogam  HBsAg:     TDaP vaccine                       Flu Shot: HIV:     Baby Food                                GBS:   Contraception  Pap:  CBB     CS/VBAC    Support Person               Dating changed based on today's ultrasound. Single IUP at 2777w2d with cardiac activity.    General obstetric precautions were reviewed with the patient.  Return in about 2 weeks (around 09/27/2017) for ROB and MaterniTi 21.  [name], CNM 09/13/2017  10:19 AM

## 2017-09-13 NOTE — Progress Notes (Signed)
No concerns.rj 

## 2017-09-15 ENCOUNTER — Ambulatory Visit: Payer: Managed Care, Other (non HMO)

## 2017-09-27 ENCOUNTER — Encounter: Payer: Self-pay | Admitting: Obstetrics and Gynecology

## 2017-09-27 ENCOUNTER — Ambulatory Visit (INDEPENDENT_AMBULATORY_CARE_PROVIDER_SITE_OTHER): Payer: Managed Care, Other (non HMO) | Admitting: Obstetrics and Gynecology

## 2017-09-27 VITALS — BP 118/60 | Wt 200.0 lb

## 2017-09-27 DIAGNOSIS — Z3A10 10 weeks gestation of pregnancy: Secondary | ICD-10-CM

## 2017-09-27 DIAGNOSIS — O99331 Smoking (tobacco) complicating pregnancy, first trimester: Secondary | ICD-10-CM

## 2017-09-27 DIAGNOSIS — Z124 Encounter for screening for malignant neoplasm of cervix: Secondary | ICD-10-CM

## 2017-09-27 DIAGNOSIS — Z3401 Encounter for supervision of normal first pregnancy, first trimester: Secondary | ICD-10-CM

## 2017-09-27 DIAGNOSIS — O99333 Smoking (tobacco) complicating pregnancy, third trimester: Secondary | ICD-10-CM | POA: Insufficient documentation

## 2017-09-27 NOTE — Progress Notes (Signed)
    Routine Prenatal Care Visit  Subjective  Beth Willis is a 22 y.o. G1P0 at 705w2d being seen today for ongoing prenatal care.  She is currently monitored for the following issues for this low-risk pregnancy and has Encounter for supervision of normal first pregnancy in first trimester and Tobacco use affecting pregnancy in first trimester, antepartum on their problem list.  ----------------------------------------------------------------------------------- Patient reports no complaints.    . Vag. Bleeding: None.   . Denies leaking of fluid.  ----------------------------------------------------------------------------------- The following portions of the patient's history were reviewed and updated as appropriate: allergies, current medications, past family history, past medical history, past social history, past surgical history and problem list. Problem list updated.   Objective  Blood pressure 118/60, weight 200 lb (90.7 kg), last menstrual period 06/17/2017. Pregravid weight 170 lb (77.1 kg) Total Weight Gain 30 lb (13.6 kg) Urinalysis: Urine Protein: Trace Urine Glucose: Negative  Fetal Status: Fetal Heart Rate (bpm): 170         General:  Alert, oriented and cooperative. Patient is in no acute distress.  Skin: Skin is warm and dry. No rash noted.   Cardiovascular: Normal heart rate noted  Respiratory: Normal respiratory effort, no problems with respiration noted  Abdomen: Soft, gravid, appropriate for gestational age. Pain/Pressure: Absent     Pelvic:  Cervical exam deferred        Extremities: Normal range of motion.     ental Status: Normal mood and affect. Normal behavior. Normal judgment and thought content.     Assessment   22 y.o. G1P0 at 435w2d by  04/23/2018, by Ultrasound presenting for routine prenatal visit  Plan     FIRST Problems (from 09/07/17 to present)    Problem Noted Resolved   Encounter for supervision of normal first pregnancy in first trimester  09/07/2017 by Oswaldo ConroySchmid, Jacelyn Y, CNM No   Overview Addendum 09/27/2017 12:47 PM by Natale MilchSchuman, Liisa Picone R, MD    [facility]inic [facility]  Dating Ultrasound 09/13/2017 Blood type: A, Positive-- (03/13 0000)   Genetic Screen : NIPS: Antibody:Negative (03/13 0000)  Anatomic US  Rubella:   Varicella: Immune (03/13 0000)  GTT Early:               Third trimester:  RPR: Nonreactive (03/13 0000)   Rhogam  HBsAg: Negative (03/13 0000)   TDaP vaccine                        Flu Shot: 09/06/17 HIV:     Baby Food                                GBS:   Contraception  Nuvaring Pap:  CBB     CS/VBAC    Support Person  Trinna Postlex                  Gestational age appropriate obstetric precautions including but not limited to vaginal bleeding, contractions, leaking of fluid and fetal movement were reviewed in detail with the patient.    Prenatal records reviewed, results entered into result console Pap, HIV, rubella, and Materniti21 testing today.  Return in about 2 weeks (around 10/11/2017) for ROB.  Adelene Idlerhristanna Brigham Cobbins MD  [facility], [facility] 09/27/2017, 12:47 PM

## 2017-09-27 NOTE — Progress Notes (Signed)
Rob

## 2017-09-28 LAB — RUBELLA SCREEN: Rubella Antibodies, IGG: 1.58 index (ref >0.99)

## 2017-09-28 LAB — HIV ANTIBODY (ROUTINE TESTING W REFLEX): HIV SCREEN 4TH GENERATION: NONREACTIVE

## 2017-09-28 NOTE — Progress Notes (Signed)
HIV negative, rubella immune. Released to mychart.

## 2017-09-29 LAB — PAPIG, HPV, RFX 16/18
HPV, HIGH-RISK: NEGATIVE
PAP Smear Comment: 0

## 2017-10-01 LAB — MATERNIT 21 PLUS CORE, BLOOD
CHROMOSOME 13: NEGATIVE
CHROMOSOME 18: NEGATIVE
CHROMOSOME 21: NEGATIVE
Y Chromosome: DETECTED

## 2017-10-04 ENCOUNTER — Ambulatory Visit (INDEPENDENT_AMBULATORY_CARE_PROVIDER_SITE_OTHER): Payer: Managed Care, Other (non HMO) | Admitting: Maternal Newborn

## 2017-10-04 ENCOUNTER — Encounter: Payer: Self-pay | Admitting: Maternal Newborn

## 2017-10-04 ENCOUNTER — Telehealth: Payer: Self-pay

## 2017-10-04 VITALS — BP 120/80 | Wt 196.0 lb

## 2017-10-04 DIAGNOSIS — Z3401 Encounter for supervision of normal first pregnancy, first trimester: Secondary | ICD-10-CM

## 2017-10-04 DIAGNOSIS — G43709 Chronic migraine without aura, not intractable, without status migrainosus: Secondary | ICD-10-CM | POA: Insufficient documentation

## 2017-10-04 MED ORDER — BUTALBITAL-APAP-CAFFEINE 50-325-40 MG PO CAPS: 1.0000 | ORAL_CAPSULE | Freq: Four times a day (QID) | ORAL | 3 refills | Status: DC | PRN

## 2017-10-04 NOTE — Telephone Encounter (Signed)
Pt called c/o more freq migraines, e.s. Tylenol and caffeine not helping.  Adv to be seen, tx'd to [place] for appt.

## 2017-10-04 NOTE — Progress Notes (Signed)
C/O migraine for 3d; had it last week too.  Hx of migraine.rj

## 2017-10-04 NOTE — Progress Notes (Signed)
    Prenatal Problem Visit  Subjective  Beth Willis is a 22 y.o. G1P0 at 4945w2d being seen today for ongoing prenatal care.  She is currently monitored for the following issues for this low-risk pregnancy and has Encounter for supervision of normal first pregnancy in first trimester and Tobacco use affecting pregnancy in first trimester, antepartum on their problem list.  ----------------------------------------------------------------------------------- Patient reports migraine headache. It has been happening frequently in the past few weeks; this episode started three days ago. Pounding headache in the frontal, temporal, and occipital regions. No aura or visual changes. Fatigue and current heavy pollen load may be triggers. No relief with rest, Tylenol, increasing hydration, and intermittent addition of caffeine. ----------------------------------------------------------------------------------- The following portions of the patient's history were reviewed and updated as appropriate: allergies, current medications, past family history, past medical history, past social history, past surgical history and problem list. Problem list updated.  Objective  Blood pressure 120/80, weight 196 lb (88.9 kg), last menstrual period 06/17/2017. Pregravid weight 170 lb (77.1 kg) Total Weight Gain 26 lb (11.8 kg) Urinalysis:      General:  Alert, oriented and cooperative. Patient is in no acute distress.  Skin: Skin is warm and dry. No rash noted.   Cardiovascular: Normal heart rate noted  Respiratory: Normal respiratory effort, no problems with respiration noted  Abdomen: Soft, gravid, appropriate for gestational age.       Pelvic:  Cervical exam deferred        Extremities: Normal range of motion.     Mental Status: Normal mood and affect. Normal behavior. Normal judgment and thought content.     Assessment   22 y.o. G1P0 at 4545w2d, EDD 04/23/2018 by Ultrasound presenting for work-in prenatal  visit.  Plan   FIRST Problems (from 09/07/17 to present)    Problem Noted Resolved   Encounter for supervision of normal first pregnancy in first trimester 09/07/2017 by Oswaldo ConroySchmid, Luian Schumpert Y, CNM No   Overview Addendum 09/27/2017 12:47 PM by Natale MilchSchuman, Christanna R, MD    [facility]inic [facility]  Dating Ultrasound 09/13/2017 Blood type: A, Positive-- (03/13 0000)   Genetic Screen : NIPS: Antibody:Negative (03/13 0000)  Anatomic US  Rubella:   Varicella: Immune (03/13 0000)  GTT Early:               Third trimester:  RPR: Nonreactive (03/13 0000)   Rhogam  HBsAg: Negative (03/13 0000)   TDaP vaccine                        Flu Shot: 09/06/17 HIV:     Baby Food                                GBS:   Contraception  Nuvaring Pap:  CBB     CS/VBAC    Support Person  Trinna Postlex              Will try Fioricet to see if symptoms are relieved. Also discussed cold packs for comfort. She is currently taking antihistamines daily to help with environmental allergies.   Keep scheduled ROB.  Marcelyn BruinsJacelyn Dat Derksen, CNM 10/04/2017  2:33 PM

## 2017-10-11 ENCOUNTER — Encounter: Payer: Managed Care, Other (non HMO) | Admitting: Obstetrics and Gynecology

## 2017-10-13 ENCOUNTER — Ambulatory Visit (INDEPENDENT_AMBULATORY_CARE_PROVIDER_SITE_OTHER): Payer: Managed Care, Other (non HMO) | Admitting: Advanced Practice Midwife

## 2017-10-13 ENCOUNTER — Encounter: Payer: Self-pay | Admitting: Advanced Practice Midwife

## 2017-10-13 VITALS — BP 108/64 | Wt 200.0 lb

## 2017-10-13 DIAGNOSIS — Z3A12 12 weeks gestation of pregnancy: Secondary | ICD-10-CM

## 2017-10-13 NOTE — Patient Instructions (Signed)

## 2017-10-13 NOTE — Progress Notes (Signed)
  Routine Prenatal Care Visit  Subjective  Beth Willis is a 22 y.o. G1P0 at 2356w4d being seen today for ongoing prenatal care.  She is currently monitored for the following issues for this low-risk pregnancy and has Encounter for supervision of normal first pregnancy in first trimester; Tobacco use affecting pregnancy in first trimester, antepartum; and Chronic migraine without aura without status migrainosus, not intractable on their problem list.  ----------------------------------------------------------------------------------- Patient reports frequent headaches especially in the mornings which are helped by fioricet and zyrtec. Discussed migraine triggers and remedies.  Having gender reveal party this weekend. Contractions: Not present. Vag. Bleeding: None.   . Denies leaking of fluid.  ----------------------------------------------------------------------------------- The following portions of the patient's history were reviewed and updated as appropriate: allergies, current medications, past family history, past medical history, past social history, past surgical history and problem list. Problem list updated.   Objective  Blood pressure 108/64, weight 200 lb (90.7 kg), last menstrual period 06/17/2017. Pregravid weight 170 lb (77.1 kg) Total Weight Gain 30 lb (13.6 kg) Urinalysis:      Fetal Status: Fetal Heart Rate (bpm): 165         General:  Alert, oriented and cooperative. Patient is in no acute distress.  Skin: Skin is warm and dry. No rash noted.   Cardiovascular: Normal heart rate noted  Respiratory: Normal respiratory effort, no problems with respiration noted  Abdomen: Soft, gravid, appropriate for gestational age. Pain/Pressure: Absent     Pelvic:  Cervical exam deferred        Extremities: Normal range of motion.     Mental Status: Normal mood and affect. Normal behavior. Normal judgment and thought content.   Assessment   22 y.o. G1P0 at 2356w4d by  04/23/2018, by  Ultrasound presenting for routine prenatal visit  Plan   FIRST Problems (from 09/07/17 to present)    Problem Noted Resolved   Encounter for supervision of normal first pregnancy in first trimester 09/07/2017 by Oswaldo ConroySchmid, Jacelyn Y, CNM No   Overview Addendum 09/27/2017 12:47 PM by Natale MilchSchuman, Christanna R, MD    Clinic [facility] P[facility]  Dating Ultrasound 09/13/2017 Blood type: A, Positive-- (03/13 0000)   Genetic Screen : NIPS: Antibody:Negative (03/13 0000)  Anatomic US  Rubella:   Varicella: Immune (03/13 0000)  GTT Early:               Third trimester:  RPR: Nonreactive (03/13 0000)   Rhogam  HBsAg: Negative (03/13 0000)   TDaP vaccine                        Flu Shot: 09/06/17 HIV:     Baby Food                                GBS:   Contraception  Nuvaring Pap:  CBB     CS/VBAC    Support Person  Alex                 Preterm labor symptoms and general obstetric precautions including but not limited to vaginal bleeding, contractions, leaking of fluid and fetal movement were reviewed in detail with the patient. Please refer to After Visit Summary for other counseling recommendations.  Encouraged adequate sleep, increased hydration, magnesium supplement to help with migraines  Return in about 1 month (around 11/10/2017) for rob.  Tresea MallJane Noheli Melder, CNM 10/13/2017 4:21 PM

## 2017-10-13 NOTE — Progress Notes (Signed)
ROB Migraines 

## 2017-10-16 ENCOUNTER — Other Ambulatory Visit: Payer: Self-pay

## 2017-10-16 ENCOUNTER — Emergency Department
Admission: EM | Admit: 2017-10-16 | Discharge: 2017-10-16 | Disposition: A | Discharge status: Home / Self Care | Payer: Managed Care, Other (non HMO) | Attending: Emergency Medicine | Admitting: Emergency Medicine

## 2017-10-16 ENCOUNTER — Emergency Department: Payer: Managed Care, Other (non HMO)

## 2017-10-16 DIAGNOSIS — O26891 Other specified pregnancy related conditions, first trimester: Secondary | ICD-10-CM | POA: Insufficient documentation

## 2017-10-16 DIAGNOSIS — O219 Vomiting of pregnancy, unspecified: Secondary | ICD-10-CM | POA: Diagnosis present

## 2017-10-16 DIAGNOSIS — R112 Nausea with vomiting, unspecified: Secondary | ICD-10-CM

## 2017-10-16 DIAGNOSIS — Z3A13 13 weeks gestation of pregnancy: Secondary | ICD-10-CM | POA: Insufficient documentation

## 2017-10-16 DIAGNOSIS — R197 Diarrhea, unspecified: Secondary | ICD-10-CM | POA: Diagnosis not present

## 2017-10-16 DIAGNOSIS — O99331 Smoking (tobacco) complicating pregnancy, first trimester: Secondary | ICD-10-CM | POA: Insufficient documentation

## 2017-10-16 DIAGNOSIS — F17210 Nicotine dependence, cigarettes, uncomplicated: Secondary | ICD-10-CM | POA: Insufficient documentation

## 2017-10-16 DIAGNOSIS — R1032 Left lower quadrant pain: Secondary | ICD-10-CM

## 2017-10-16 LAB — URINALYSIS, COMPLETE (UACMP) WITH MICROSCOPIC
BILIRUBIN URINE: NEGATIVE
Glucose, UA: NEGATIVE mg/dL
Hgb urine dipstick: NEGATIVE
KETONES UR: NEGATIVE mg/dL
Leukocytes, UA: NEGATIVE
NITRITE: NEGATIVE
Protein, ur: NEGATIVE mg/dL
SPECIFIC GRAVITY, URINE: 1.017 (ref 1.005–1.030)
pH: 6 (ref 5.0–8.0)

## 2017-10-16 LAB — CBC
HEMATOCRIT: 38.8 % (ref 35.0–47.0)
Hemoglobin: 13.4 g/dL (ref 12.0–16.0)
MCH: 29.7 pg (ref 26.0–34.0)
MCHC: 34.5 g/dL (ref 32.0–36.0)
MCV: 86.2 fL (ref 80.0–100.0)
Platelets: 189 10*3/uL (ref 150–440)
RBC: 4.49 MIL/uL (ref 3.80–5.20)
RDW: 15.2 % — AB (ref 11.5–14.5)
WBC: 10 10*3/uL (ref 3.6–11.0)

## 2017-10-16 LAB — COMPREHENSIVE METABOLIC PANEL
ALT: 14 U/L (ref 14–54)
AST: 17 U/L (ref 15–41)
Albumin: 3.8 g/dL (ref 3.5–5.0)
Alkaline Phosphatase: 58 U/L (ref 38–126)
Anion gap: 6 (ref 5–15)
BILIRUBIN TOTAL: 0.4 mg/dL (ref 0.3–1.2)
CO2: 24 mmol/L (ref 22–32)
Calcium: 9.1 mg/dL (ref 8.9–10.3)
Chloride: 106 mmol/L (ref 101–111)
Creatinine, Ser: 0.58 mg/dL (ref 0.44–1.00)
GFR calc Af Amer: >60 mL/min (ref >60)
Glucose, Bld: 95 mg/dL (ref 65–99)
POTASSIUM: 3.7 mmol/L (ref 3.5–5.1)
Sodium: 136 mmol/L (ref 135–145)
TOTAL PROTEIN: 7.4 g/dL (ref 6.5–8.1)

## 2017-10-16 LAB — HCG, QUANTITATIVE, PREGNANCY: HCG, BETA CHAIN, QUANT, S: 159251 m[IU]/mL — AB (ref <5)

## 2017-10-16 LAB — LIPASE, BLOOD: Lipase: 24 U/L (ref 11–51)

## 2017-10-16 MED ORDER — PROMETHAZINE HCL 25 MG/ML IJ SOLN
12.5000 mg | Freq: Once | INTRAMUSCULAR | Status: DC
  Filled 2017-10-16: qty 1

## 2017-10-16 MED ORDER — PROMETHAZINE HCL 25 MG PO TABS
25.0000 mg | ORAL_TABLET | Freq: Once | ORAL | Status: AC
  Administered 2017-10-16: 25 mg via ORAL
  Filled 2017-10-16: qty 1

## 2017-10-16 MED ORDER — SODIUM CHLORIDE 0.9 % IV BOLUS
1000.0000 mL | Freq: Once | INTRAVENOUS | Status: AC
  Administered 2017-10-16: 1000 mL via INTRAVENOUS

## 2017-10-16 MED ORDER — PROMETHAZINE HCL 25 MG PO TABS: 25.0000 mg | ORAL_TABLET | Freq: Three times a day (TID) | ORAL | 0 refills | Status: DC | PRN

## 2017-10-16 MED ORDER — BUTALBITAL-APAP-CAFFEINE 50-325-40 MG PO TABS
1.0000 | ORAL_TABLET | ORAL | Status: DC
  Filled 2017-10-16: qty 1

## 2017-10-16 NOTE — ED Triage Notes (Signed)
Pt states that she has vomited 3-4 times this am, spouse is also sick with vomiting and diarrhea, pt is c/o left sided abd pain, pt reports [redacted] weeks pregnant

## 2017-10-16 NOTE — Discharge Instructions (Signed)
° °  Please return to the emergency room right away if you are to develop a fever, vaginal bleeding, severe nausea, your pain becomes severe or worsens, you are unable to keep food down, begin vomiting any dark or bloody fluid, you develop any dark or bloody stools, feel dehydrated, or other new concerns or symptoms arise.

## 2017-10-16 NOTE — ED Provider Notes (Signed)
[facility] Emergency Department Provider Note   ____________________________________________   First MD Initiated Contact with Patient 10/16/17 1556     (approximate)  I have reviewed the triage vital signs and the nursing notes.   HISTORY  Chief Complaint Abdominal Pain; Emesis; and Diarrhea    HPI Beth Willis is a 22 y.o. female reports no significant medical history other than chronic migraines for which she has been prescribed butalbital by her primary doctor.  Also reports that she is [redacted] weeks pregnant.  Patient reports that yesterday she and her boyfriend went out for dinner and her stomach began feeling upset and crampy.  She also reports that her boyfriend also experienced the same symptoms about the same time last night.  They both had nausea and vomiting.  She reports that her nausea and vomiting seems to be improving and she has not vomited since arriving to the ER.  However, she was seen at urgent care and they recommended to her that she come to have IV fluids given.  She reports they diagnosed her as having a "stomach bug" at urgent care but felt since she was pregnant she may need some additional fluids.  She currently denies any ongoing abdominal pain except for slight discomfort in the left lower quadrant.  Denies ongoing vomiting since this morning.  Somewhat couple of loose stools as well.  No black or bloody stools.  Denies pain in the right side of the abdomen particularly under the right rib cage or in the right lower abdomen.  Denies any vaginal bleeding or fluid leak.  No past medical history on file.  Patient Active Problem List   Diagnosis Date Noted  . Chronic migraine without aura without status migrainosus, not intractable 10/04/2017  . Tobacco use affecting pregnancy in first trimester, antepartum 09/27/2017  . Encounter for supervision of normal first pregnancy in first trimester 09/07/2017    Past Surgical History:    Procedure Laterality Date  . TONSILLECTOMY  2014   Pt not sure if addenoids taken  . WISDOM TOOTH EXTRACTION  2014    Prior to Admission medications   Medication Sig Start Date End Date Taking? Authorizing Provider  Butalbital-APAP-Caffeine 50-325-40 MG capsule Take 1-2 capsules by mouth every 6 (six) hours as needed for headache. 10/04/17   Oswaldo Conroy, CNM  neomycin-polymyxin-hydrocortisone (CORTISPORIN) 3.5-10000-1 OTIC suspension Place 4 drops into both ears 3 (three) times daily. 08/26/17   [provider]  Prenat-FeCbn-FeAsp-Meth-FA-DHA (PRENATE MINI) 18-0.6-0.4-350 MG CAPS Take 1 capsule by mouth daily. 09/13/17   Oswaldo Conroy, CNM  promethazine (PHENERGAN) 25 MG tablet Take 1 tablet (25 mg total) by mouth every 8 (eight) hours as needed for nausea or vomiting. 10/16/17   Sharyn Creamer, MD    Allergies Amoxicillin; Penicillins; and Pollen extract  Family History  Problem Relation Age of Onset  . Hypertension Mother   . Hypertension Father   . Hypertension Maternal Grandmother   . Diabetes Maternal Grandmother   . Thyroid disease Maternal Grandmother   . Hypertension Maternal Grandfather   . Thyroid disease Maternal Grandfather   . Hypertension Paternal Grandmother   . Thyroid disease Paternal Grandmother   . Hypertension Paternal Grandfather   . Thyroid disease Paternal Grandfather   . Hypertension Maternal Aunt   . Hypertension Maternal Uncle   . Hypertension Paternal Aunt   . Diabetes Paternal Aunt   . Hypertension Paternal Uncle     Social History Social History   Tobacco Use  .  Smoking status: Current Every Day Smoker    Packs/day: 0.50    Types: Cigarettes  . Smokeless tobacco: Never Used  Substance Use Topics  . Alcohol use: No    Frequency: Never  . Drug use: No    Comment: former    Review of Systems Constitutional: No fever/chills.  No Lightheadedness. Eyes: No visual changes. ENT: No sore throat. Cardiovascular: Denies chest  pain. Respiratory: Denies shortness of breath. Gastrointestinal: No diarrhea. Genitourinary: Negative for dysuria.  No vaginal bleeding or discharge. Musculoskeletal: Negative for back pain. Skin: Negative for rash. Neurological: Negative for headaches, focal weakness or numbness.    ____________________________________________   PHYSICAL EXAM:  VITAL SIGNS: ED Triage Vitals  Enc Vitals Group     BP 10/16/17 1313 114/70     Pulse Rate 10/16/17 1313 79     Resp 10/16/17 1313 16     Temp 10/16/17 1313 97.6 F (36.4 C)     Temp Source 10/16/17 1313 Oral     SpO2 10/16/17 1545 100 %     Weight 10/16/17 1315 200 lb (90.7 kg)     Height 10/16/17 1315 5\' 4"  (1.626 m)     Head Circumference --      Peak Flow --      Pain Score 10/16/17 1314 8     Pain Loc --      Pain Edu? --      Excl. in GC? --     Constitutional: Alert and oriented. Well appearing and in no acute distress. Eyes: Conjunctivae are normal. Head: Atraumatic. Nose: No congestion/rhinnorhea. Mouth/Throat: Mucous membranes are moist. Neck: No stridor.   Cardiovascular: Normal rate, regular rhythm. Grossly normal heart sounds.  Good peripheral circulation. Respiratory: Normal respiratory effort.  No retractions. Lungs CTAB. Gastrointestinal: Soft and nontender except for some minimal tenderness for the left lower quadrant without rebound or guarding.  Negative Murphy.  No pain at McBurney's point.. No distention. Musculoskeletal: No lower extremity tenderness nor edema. Neurologic:  Normal speech and language. No gross focal neurologic deficits are appreciated.  Skin:  Skin is warm, dry and intact. No rash noted. Psychiatric: Mood and affect are normal. Speech and behavior are normal.  ____________________________________________   LABS (all labs ordered are listed, but only abnormal results are displayed)  Labs Reviewed  COMPREHENSIVE METABOLIC PANEL - Abnormal; Notable for the following components:       Result Value   BUN <5 (*)    All other components within normal limits  CBC - Abnormal; Notable for the following components:   RDW 15.2 (*)    All other components within normal limits  URINALYSIS, COMPLETE (UACMP) WITH MICROSCOPIC - Abnormal; Notable for the following components:   Color, Urine YELLOW (*)    APPearance CLEAR (*)    Squamous Epithelial / LPF 0-5 (*)    All other components within normal limits  HCG, QUANTITATIVE, PREGNANCY - Abnormal; Notable for the following components:   hCG, Beta Chain, Quant, S 159,251 (*)    All other components within normal limits  LIPASE, BLOOD   ____________________________________________  EKG   ____________________________________________  RADIOLOGY    I discussed with the patient the risks and benefits of abdominal CT scan. The present time there is no clear indication that the patient requires CT, the patient does have an abdominal complaint but exam does not suggest acute surgical abdomen and my suspicion for intra-abdominal infection including appendicitis, cholecystitis, aaa, dissection, ischemia, perforation, pancreatitis, diverticulitis or other acute major  intra-abdominal process is quite low. If the patient does have worsening symptoms, develops a high fever, develops pain or persistent discomfort in the right upper quadrant or right lower quadrant, or other new concerns arise they will come back to emergency room right away. As the patient's clinician I think this is a very reasonable decision having discussed general risks and benefits of CT, and my clinical suspicion that CT would be of benefit at this time is very low.  ____________________________________________   PROCEDURES  Procedure(s) performed:   Procedures  Critical Care performed: No  ____________________________________________   INITIAL IMPRESSION / ASSESSMENT AND PLAN / ED COURSE  Pertinent labs & imaging results that were available during my care of  the patient were reviewed by me and considered in my medical decision making (see chart for details).  Patient presents for evaluation for nausea vomiting and diarrhea.  She is roughly [redacted] weeks pregnant, with reassuring clinical examination.  On my examination there is no suggestion of an acute abdomen, no pain at McBurney's point no signs or symptoms of cholecystitis or appendicitis.  Reassuring exam normal white count afebrile.  She actually reports her symptoms seem to be improving since arriving to the ER.  Given the patient's history, pregnancy status we will obtain ultrasound to check fetal well-being in addition will provide IV fluids and Phenergan after we discussed risks benefits and potential side effects of Phenergan for which the patient is agreeable.  Given the patient's overall status, clinical exam and history I suspect likely a self-limited viral illness affecting her and her boyfriend brought to the same [place] more potentially other food borne illness or food poisoning.  No signs or symptoms suggest acute bacterial etiology.  Likely self-limited.  No signs or symptoms of acute intra-abdominal etiology or pregnancy complication.  ----------------------------------------- 5:31 PM on 10/16/2017 -----------------------------------------  After finishing IV fluid, patient reports she feels much better.  Been able to tolerate by mouth would like to go home.  No further nausea vomiting in the ER.  Comfortable with plan of care and careful return precautions.  Follow-up with her OB/GYN physician.  She does not wish for any nausea medicine now she reports she feels better.  She would like a prescription however to take home if she needs it.  I think is quite reasonable.  Return precautions and treatment recommendations and follow-up discussed with the patient who is agreeable with the plan.       ____________________________________________   FINAL CLINICAL IMPRESSION(S) / ED  DIAGNOSES  Final diagnoses:  Nausea vomiting and diarrhea      NEW MEDICATIONS STARTED DURING THIS VISIT:  New Prescriptions   PROMETHAZINE (PHENERGAN) 25 MG TABLET    Take 1 tablet (25 mg total) by mouth every 8 (eight) hours as needed for nausea or vomiting.     Note:  This document was prepared using Dragon voice recognition software and may include unintentional dictation errors.     Sharyn CreamerQuale, Mark, MD 10/16/17 [phone]

## 2017-10-18 ENCOUNTER — Ambulatory Visit (INDEPENDENT_AMBULATORY_CARE_PROVIDER_SITE_OTHER): Payer: Managed Care, Other (non HMO) | Admitting: Certified Nurse Midwife

## 2017-10-18 VITALS — BP 108/64 | Wt 194.0 lb

## 2017-10-18 DIAGNOSIS — Z3A13 13 weeks gestation of pregnancy: Secondary | ICD-10-CM

## 2017-10-18 DIAGNOSIS — Z91410 Personal history of adult physical and sexual abuse: Secondary | ICD-10-CM | POA: Insufficient documentation

## 2017-10-18 DIAGNOSIS — R1032 Left lower quadrant pain: Secondary | ICD-10-CM

## 2017-10-18 DIAGNOSIS — O26892 Other specified pregnancy related conditions, second trimester: Secondary | ICD-10-CM

## 2017-10-18 DIAGNOSIS — Z87898 Personal history of other specified conditions: Secondary | ICD-10-CM | POA: Insufficient documentation

## 2017-10-18 DIAGNOSIS — F988 Other specified behavioral and emotional disorders with onset usually occurring in childhood and adolescence: Secondary | ICD-10-CM | POA: Insufficient documentation

## 2017-10-18 HISTORY — DX: Personal history of adult physical and sexual abuse: Z91.410

## 2017-10-18 NOTE — Progress Notes (Signed)
Pt seen in ER for abd cramping and pain. Reviewed her results on MyChart that stated cyst of left ovary. Pt states pain has improved but was told she would have an ultrasound today.

## 2017-10-18 NOTE — Progress Notes (Signed)
Work in appointment at [employer]13wk2d for ER follow up visit. Seen in ER 2 days ago with c/o LLQ pain, sharp, intermittent. Pain not as painful today, felt a little better after BM. Some constipation issues.  No dysuria. No vaginal bleeding. UA was negative in ER. Had ultrasound revealing a 17 mm left ovarian cyst. CRL was consistent with dates. +FCA. No SCH.  Exam: General: in NAD Abdomen: FH about SP +4FB.FHTs WNL. Points to area close to left groin as to location of pain.  A: IUP at 13wk2 days with LLQ pain-possible ligament pain, possible pain due to constipation  P: Reassured RTO as scheduled for ROB [facility] for stool softener-for constipation  Farrel Connersolleen Labradford Schnitker, CNM

## 2017-11-04 ENCOUNTER — Ambulatory Visit (INDEPENDENT_AMBULATORY_CARE_PROVIDER_SITE_OTHER): Payer: Managed Care, Other (non HMO) | Admitting: Obstetrics and Gynecology

## 2017-11-04 ENCOUNTER — Encounter: Payer: Self-pay | Admitting: Obstetrics and Gynecology

## 2017-11-04 VITALS — BP 140/80 | Wt 197.0 lb

## 2017-11-04 DIAGNOSIS — O99891 Other specified diseases and conditions complicating pregnancy: Secondary | ICD-10-CM | POA: Insufficient documentation

## 2017-11-04 DIAGNOSIS — Z3A15 15 weeks gestation of pregnancy: Secondary | ICD-10-CM

## 2017-11-04 DIAGNOSIS — O9989 Other specified diseases and conditions complicating pregnancy, childbirth and the puerperium: Secondary | ICD-10-CM

## 2017-11-04 DIAGNOSIS — Z3401 Encounter for supervision of normal first pregnancy, first trimester: Secondary | ICD-10-CM

## 2017-11-04 DIAGNOSIS — F1911 Other psychoactive substance abuse, in remission: Secondary | ICD-10-CM

## 2017-11-04 DIAGNOSIS — Z87898 Personal history of other specified conditions: Secondary | ICD-10-CM

## 2017-11-04 DIAGNOSIS — M549 Dorsalgia, unspecified: Secondary | ICD-10-CM

## 2017-11-04 HISTORY — DX: Dorsalgia, unspecified: M54.9
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
## 2017-11-04 NOTE — Patient Instructions (Signed)
Second Trimester of Pregnancy The second trimester is from week 13 through week 28, month 4 through 6. This is often the time in pregnancy that you feel your best. Often times, morning sickness has lessened or quit. You may have more energy, and you may get hungry more often. Your unborn baby (fetus) is growing rapidly. At the end of the sixth month, he or she is about 9 inches long and weighs about 1 pounds. You will likely feel the baby move (quickening) between 18 and 20 weeks of pregnancy. Follow these instructions at home:  Avoid all smoking, herbs, and alcohol. Avoid drugs not approved by your doctor.  Do not use any tobacco products, including cigarettes, chewing tobacco, and electronic cigarettes. If you need help quitting, ask your doctor. You may get counseling or other support to help you quit.  Only take medicine as told by your doctor. Some medicines are safe and some are not during pregnancy.  Exercise only as told by your doctor. Stop exercising if you start having cramps.  Eat regular, healthy meals.  Wear a good support bra if your breasts are tender.  Do not use hot tubs, steam rooms, or saunas.  Wear your seat belt when driving.  Avoid raw meat, uncooked cheese, and liter boxes and soil used by cats.  Take your prenatal vitamins.  Take 1500-2000 milligrams of calcium daily starting at the 20th week of pregnancy until you deliver your baby.  Try taking medicine that helps you poop (stool softener) as needed, and if your doctor approves. Eat more fiber by eating fresh fruit, vegetables, and whole grains. Drink enough fluids to keep your pee (urine) clear or pale yellow.  Take warm water baths (sitz baths) to soothe pain or discomfort caused by hemorrhoids. Use hemorrhoid cream if your doctor approves.  If you have puffy, bulging veins (varicose veins), wear support hose. Raise (elevate) your feet for 15 minutes, 3-4 times a day. Limit salt in your diet.  Avoid heavy  lifting, wear low heals, and sit up straight.  Rest with your legs raised if you have leg cramps or low back pain.  Visit your dentist if you have not gone during your pregnancy. Use a soft toothbrush to brush your teeth. Be gentle when you floss.  You can have sex (intercourse) unless your doctor tells you not to.  Go to your doctor visits. Get help if:  You feel dizzy.  You have mild cramps or pressure in your lower belly (abdomen).  You have a nagging pain in your belly area.  You continue to feel sick to your stomach (nauseous), throw up (vomit), or have watery poop (diarrhea).  You have bad smelling fluid coming from your vagina.  You have pain with peeing (urination). Get help right away if:  You have a fever.  You are leaking fluid from your vagina.  You have spotting or bleeding from your vagina.  You have severe belly cramping or pain.  You lose or gain weight rapidly.  You have trouble catching your breath and have chest pain.  You notice sudden or extreme puffiness (swelling) of your face, hands, ankles, feet, or legs.  You have not felt the baby move in over an hour.  You have severe headaches that do not go away with medicine.  You have vision changes. This information is not intended to replace advice given to you by your health care provider. Make sure you discuss any questions you have with your health care   provider. Document Released: 09/08/2009 Document Revised: 11/20/2015 Document Reviewed: 08/15/2012 Elsevier Interactive Patient Education  2017 [employer].  

## 2017-11-05 LAB — HEPATITIS C ANTIBODY: HEP C VIRUS AB: 0.2 {s_co_ratio} (ref 0.0–0.9)

## 2017-11-07 NOTE — Progress Notes (Signed)
Negative, released to mychart

## 2017-11-09 ENCOUNTER — Telehealth: Payer: Self-pay

## 2017-11-09 ENCOUNTER — Ambulatory Visit: Payer: Managed Care, Other (non HMO) | Attending: Obstetrics and Gynecology | Admitting: Physical Therapy

## 2017-11-09 ENCOUNTER — Other Ambulatory Visit: Payer: Self-pay

## 2017-11-09 ENCOUNTER — Encounter: Payer: Self-pay | Admitting: Physical Therapy

## 2017-11-09 DIAGNOSIS — R2689 Other abnormalities of gait and mobility: Secondary | ICD-10-CM | POA: Diagnosis present

## 2017-11-09 DIAGNOSIS — M25532 Pain in left wrist: Secondary | ICD-10-CM | POA: Diagnosis present

## 2017-11-09 DIAGNOSIS — M25531 Pain in right wrist: Secondary | ICD-10-CM | POA: Diagnosis present

## 2017-11-09 DIAGNOSIS — M791 Myalgia, unspecified site: Secondary | ICD-10-CM

## 2017-11-09 DIAGNOSIS — M533 Sacrococcygeal disorders, not elsewhere classified: Secondary | ICD-10-CM | POA: Diagnosis not present

## 2017-11-09 NOTE — Telephone Encounter (Signed)
Pt calling triage today after leaving PT at [facility]. Needed a note for work for today per PT stating that she is to do no heavy lifting. Note written and she will pick up tomorrow at appt with [facility]. Pt appreciative.

## 2017-11-09 NOTE — Patient Instructions (Addendum)
PELVIC FLOOR / KEGEL EXERCISES   Pelvic floor/ Kegel exercises are used to strengthen the muscles in the base of your pelvis that are responsible for supporting your pelvic organs and preventing urine/feces leakage. Based on your therapist's recommendations, they can be performed while standing, sitting, or lying down. Imagine pelvic floor area as a diamond with pelvic landmarks: top =pubic bone, bottom tip=tailbone, sides=sitting bones (ischial tuberosities).    Make yourself aware of this muscle group by using these cues while coordinating your breath:  Inhale, feel pelvic floor diamond area lower like hammock towards your feet and ribcage/belly expanding. Pause. Let the exhale naturally and feel your belly sink, abdominal muscles hugging in around you and you may notice the pelvic diamond draws upward towards your head forming a umbrella shape. Give a squeeze during the exhalation like you are stopping the flow of urine. If you are squeezing the buttock muscles, try to give 50% less effort.   Common Errors:  Breath holding: If you are holding your breath, you may be bearing down against your bladder instead of pulling it up. If you belly bulges up while you are squeezing, you are holding your breath. Be sure to breathe gently in and out while exercising. Counting out loud may help you avoid holding your breath.  Accessory muscle use: You should not see or feel other muscle movement when performing pelvic floor exercises. When done properly, no one can tell that you are performing the exercises. Keep the buttocks, belly and inner thighs relaxed.  Overdoing it: Your muscles can fatigue and stop working for you if you over-exercise. You may actually leak more or feel soreness at the lower abdomen or rectum.  YOUR HOME EXERCISE PROGRAM     SHORT HOLDS: Position: on back, sitting   Inhale and then exhale. Then squeeze the muscle.  (Be sure to let belly sink in with exhales and not push  outward)  Perform 10 repetitions, 5  Times/day                      DECREASE DOWNWARD PRESSURE ON  YOUR PELVIC FLOOR, ABDOMINAL, LOW BACK MUSCLES       PRESERVE YOUR PELVIC HEALTH LONG-TERM   ** SQUEEZE pelvic floor BEFORE YOUR SNEEZE, COUGH, LAUGH   ** EXHALE BEFORE YOU RISE AGAINST GRAVITY (lifting, sit to stand, from squat to stand)   ** LOG ROLL OUT OF BED INSTEAD OF CRUNCH/SIT-UP   _________   Handout ( open book stretch) 15 rep   ________ Wear SIJ belt   __________ Stand with equal weight on both legs, unlocked knees

## 2017-11-10 ENCOUNTER — Ambulatory Visit (INDEPENDENT_AMBULATORY_CARE_PROVIDER_SITE_OTHER): Payer: Managed Care, Other (non HMO) | Admitting: Obstetrics & Gynecology

## 2017-11-10 VITALS — BP 122/70 | Wt 198.0 lb

## 2017-11-10 DIAGNOSIS — Z3A16 16 weeks gestation of pregnancy: Secondary | ICD-10-CM

## 2017-11-10 DIAGNOSIS — Z3402 Encounter for supervision of normal first pregnancy, second trimester: Secondary | ICD-10-CM

## 2017-11-10 DIAGNOSIS — Z87898 Personal history of other specified conditions: Secondary | ICD-10-CM

## 2017-11-10 NOTE — Progress Notes (Signed)
  Subjective  Fetal Movement? yes Contractions? no Leaking Fluid? no Vaginal Bleeding? no  Objective  BP 122/70   Wt 198 lb (89.8 kg)   LMP 06/17/2017 (Exact Date)   BMI 33.99 kg/m  General: NAD Pumonary: no increased work of breathing Abdomen: gravid, non-tender Extremities: no edema Psychiatric: mood appropriate, affect full  Assessment  22 y.o. G1P0 at [redacted]w[redacted]d by  04/23/2018, by Ultrasound presenting for routine prenatal visit  Plan   Problem List Items Addressed This Visit      Other   Encounter for supervision of normal first pregnancy in first trimester   History of substance use disorder    Other Visit Diagnoses    [redacted] weeks gestation of pregnancy    -  Primary    [place] UDS today  Beth Major, MD, [facility], [facility] 11/10/2017  3:39 PM

## 2017-11-10 NOTE — Patient Instructions (Signed)

## 2017-11-11 LAB — DRUG SCREEN, URINE
Amphetamines, Urine: NEGATIVE ng/mL
BENZODIAZEPINE QUANT UR: NEGATIVE ng/mL
Barbiturate screen, urine: POSITIVE ng/mL — AB
Cannabinoid Quant, Ur: POSITIVE ng/mL — AB
Cocaine (Metab.): NEGATIVE ng/mL
OPIATE QUANT UR: NEGATIVE ng/mL
PCP QUANT UR: NEGATIVE ng/mL
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
## 2017-11-16 ENCOUNTER — Ambulatory Visit: Payer: Managed Care, Other (non HMO) | Admitting: Physical Therapy

## 2017-11-16 DIAGNOSIS — M25531 Pain in right wrist: Secondary | ICD-10-CM

## 2017-11-16 DIAGNOSIS — M533 Sacrococcygeal disorders, not elsewhere classified: Secondary | ICD-10-CM | POA: Diagnosis not present

## 2017-11-16 DIAGNOSIS — M25532 Pain in left wrist: Secondary | ICD-10-CM

## 2017-11-16 DIAGNOSIS — M791 Myalgia, unspecified site: Secondary | ICD-10-CM

## 2017-11-16 DIAGNOSIS — R2689 Other abnormalities of gait and mobility: Secondary | ICD-10-CM

## 2017-11-16 NOTE — Patient Instructions (Addendum)
seated with red band at thigh Holding yellow band at ribs with elbows not moving away from ribs  Inhale, exhale, open knees and hands  Feel in the back of shoulders   15  reps    ______  Squeezing double folded pillow to strength adductors 5 sec x 10   ____  Long pelvic floor holds 5 sec, x 4 reps x 4 x day ( morning, evening , before leaving work in the car )  ___   [employer] For the back   Wider feet, unbuckle knees   6 directions:  3-5 reps   side bend  swings arms to rotate trunk only   mini squat and lift chest   _____  Fiance applies the stretch for back  Remember body mechanics:  small rotations along spine muscles   anchor one hand at hips or shoulder and pull gently to lengthen
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
## 2017-11-24 ENCOUNTER — Encounter: Payer: Managed Care, Other (non HMO) | Admitting: Physical Therapy

## 2017-11-30 ENCOUNTER — Ambulatory Visit: Payer: Managed Care, Other (non HMO) | Attending: Obstetrics and Gynecology | Admitting: Physical Therapy

## 2017-11-30 DIAGNOSIS — M791 Myalgia, unspecified site: Secondary | ICD-10-CM | POA: Diagnosis present

## 2017-11-30 DIAGNOSIS — M533 Sacrococcygeal disorders, not elsewhere classified: Secondary | ICD-10-CM | POA: Diagnosis present

## 2017-11-30 DIAGNOSIS — R2689 Other abnormalities of gait and mobility: Secondary | ICD-10-CM | POA: Insufficient documentation

## 2017-11-30 DIAGNOSIS — M25531 Pain in right wrist: Secondary | ICD-10-CM | POA: Diagnosis present

## 2017-11-30 DIAGNOSIS — M25532 Pain in left wrist: Secondary | ICD-10-CM | POA: Insufficient documentation
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
## 2017-12-01 ENCOUNTER — Ambulatory Visit (INDEPENDENT_AMBULATORY_CARE_PROVIDER_SITE_OTHER): Payer: Managed Care, Other (non HMO)

## 2017-12-01 ENCOUNTER — Encounter: Payer: Self-pay | Admitting: Advanced Practice Midwife

## 2017-12-01 ENCOUNTER — Ambulatory Visit (INDEPENDENT_AMBULATORY_CARE_PROVIDER_SITE_OTHER): Payer: Managed Care, Other (non HMO) | Admitting: Advanced Practice Midwife

## 2017-12-01 VITALS — BP 106/62 | Wt 202.0 lb

## 2017-12-01 DIAGNOSIS — Z3A19 19 weeks gestation of pregnancy: Secondary | ICD-10-CM

## 2017-12-01 DIAGNOSIS — Z3401 Encounter for supervision of normal first pregnancy, first trimester: Secondary | ICD-10-CM

## 2017-12-01 DIAGNOSIS — Z3A16 16 weeks gestation of pregnancy: Secondary | ICD-10-CM
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
## 2017-12-01 NOTE — Progress Notes (Signed)
Anatomy Scan today. No c/o's.

## 2017-12-07 ENCOUNTER — Encounter: Payer: Managed Care, Other (non HMO) | Admitting: Physical Therapy

## 2017-12-09 ENCOUNTER — Ambulatory Visit (INDEPENDENT_AMBULATORY_CARE_PROVIDER_SITE_OTHER): Payer: Managed Care, Other (non HMO) | Admitting: Obstetrics and Gynecology

## 2017-12-09 VITALS — BP 120/86 | Wt 203.0 lb

## 2017-12-09 DIAGNOSIS — R309 Painful micturition, unspecified: Secondary | ICD-10-CM

## 2017-12-09 DIAGNOSIS — G43709 Chronic migraine without aura, not intractable, without status migrainosus: Secondary | ICD-10-CM | POA: Diagnosis not present

## 2017-12-09 LAB — POCT URINALYSIS DIPSTICK
BILIRUBIN UA: NEGATIVE
GLUCOSE UA: NEGATIVE
KETONES UA: NEGATIVE
Leukocytes, UA: NEGATIVE
Nitrite, UA: NEGATIVE
Protein, UA: NEGATIVE
RBC UA: NEGATIVE
SPEC GRAV UA: 1.01 (ref 1.010–1.025)
UROBILINOGEN UA: NEGATIVE U/dL — AB
pH, UA: 5 (ref 5.0–8.0)

## 2017-12-09 MED ORDER — BUTALBITAL-APAP-CAFFEINE 50-325-40 MG PO CAPS: 1.0000 | ORAL_CAPSULE | Freq: Four times a day (QID) | ORAL | 3 refills | Status: DC | PRN

## 2017-12-09 NOTE — Progress Notes (Signed)
ROB Painful urination x 1 week, not constant

## 2017-12-09 NOTE — Patient Instructions (Signed)
Unisom/Doxylamine

## 2017-12-09 NOTE — Progress Notes (Signed)
Obstetrics & Gynecology Office Visit   Chief Complaint: No chief complaint on file.   History of Present Illness: [name] is a 22 y.o. G1P0 who LMP was Patient's last menstrual period was 06/17/2017 (exact date)., presents today for a problem visit.  She complains of dysuria and at times feels like she is not emptying bladder fully . She has had symptoms for 1 week. Symptoms are marked.  Patient denies back pain, vaginal discharge and fevers or chills. Patient does not have a history of recurrent UTI,  does not have a history of pyelonephritis, does not have a history of nephrolithiasis.  She does report occasionally have to double void.  Review of Systems: Review of Systems  Constitutional: Negative for chills and fever.  Genitourinary: Positive for dysuria and frequency. Negative for flank pain, hematuria and urgency.    Past Medical History:  No past medical history on file.  Past Surgical History:  Past Surgical History:  Procedure Laterality Date  . TONSILLECTOMY  2014   Pt not sure if addenoids taken  . WISDOM TOOTH EXTRACTION  2014    Gynecologic History: Patient's last menstrual period was 06/17/2017 (exact date).  Obstetric History: G1P0  Family History:  Family History  Problem Relation Age of Onset  . Hypertension Mother   . Hypertension Father   . Hypertension Maternal Grandmother   . Diabetes Maternal Grandmother   . Thyroid disease Maternal Grandmother   . Hypertension Maternal Grandfather   . Thyroid disease Maternal Grandfather   . Hypertension Paternal Grandmother   . Thyroid disease Paternal Grandmother   . Hypertension Paternal Grandfather   . Thyroid disease Paternal Grandfather   . Hypertension Maternal Aunt   . Hypertension Maternal Uncle   . Hypertension Paternal Aunt   . Diabetes Paternal Aunt   . Hypertension Paternal Uncle     Social History:  Social History   Socioeconomic History  . Marital status: Single    Spouse  name: Not on file  . Number of children: 0  . Years of education: 48  . Highest education level: Not on file  Occupational History  . Occupation: Conservation officer, [employer]    Comment: Actor  . Occupation: call center  Social Needs  . Financial resource strain: Not on file  . Food insecurity:    Worry: Not on file    Inability: Not on file  . Transportation needs:    Medical: Not on file    Non-medical: Not on file  Tobacco Use  . Smoking status: Current Every Day Smoker    Packs/day: 0.50    Types: Cigarettes  . Smokeless tobacco: Never Used  Substance and Sexual Activity  . Alcohol use: No    Frequency: Never  . Drug use: No    Comment: former  . Sexual activity: Yes    Birth control/protection: None  Lifestyle  . Physical activity:    Days per week: Not on file    Minutes per session: Not on file  . Stress: Not on file  Relationships  . Social connections:    Talks on phone: Not on file    Gets together: Not on file    Attends religious service: Not on file    Active member of club or organization: Not on file    Attends meetings of clubs or organizations: Not on file    Relationship status: Not on file  . Intimate partner violence:    Fear of current or ex  partner: Not on file    Emotionally abused: Not on file    Physically abused: Not on file    Forced sexual activity: Not on file  Other Topics Concern  . Not on file  Social History Narrative  . Not on file    Allergies:  Allergies  Allergen Reactions  . Amoxicillin   . Penicillins   . Pollen Extract     Medications: Prior to Admission medications   Medication Sig Start Date End Date Taking? Authorizing Provider  Butalbital-APAP-Caffeine 50-325-40 MG capsule Take 1-2 capsules by mouth every 6 (six) hours as needed for headache. 10/04/17   Oswaldo ConroySchmid, Jacelyn Y, CNM  neomycin-polymyxin-hydrocortisone (CORTISPORIN) 3.5-10000-1 OTIC suspension Place 4 drops into both ears 3 (three) times daily. 08/26/17   [provider]  Prenat-FeCbn-FeAsp-Meth-FA-DHA (PRENATE MINI) 18-0.6-0.4-350 MG CAPS Take 1 capsule by mouth daily. 09/13/17   Oswaldo ConroySchmid, Jacelyn Y, CNM  promethazine (PHENERGAN) 25 MG tablet Take 1 tablet (25 mg total) by mouth every 8 (eight) hours as needed for nausea or vomiting. 10/16/17   Sharyn CreamerQuale, Mark, MD    Physical Exam Vitals:  Vitals:   12/09/17 0853  BP: 120/86   Patient's last menstrual period was 06/17/2017 (exact date).  General: NAD HEENT: normocephalic, anicteric Pulmonary: No increased work of breathing Abdomen: NABS, soft, non-tender, non-distended.  Umbilicus without lesions.  No hepatomegaly, splenomegaly or masses palpable. No evidence of hernia, FHT 150 Genitourinary:  External: Normal external female genitalia.  Normal urethral meatus, normal Bartholin's and Skene's glands.    Vagina: Normal vaginal mucosa, no evidence of prolapse.    Cervix: Grossly normal in appearance, no bleeding  Uterus: Enlarged 20 week size uterus, mobile, normal contour.  No CMT  Adnexa: ovaries non-enlarged, no adnexal masses  Rectal: deferred  Lymphatic: no evidence of inguinal lymphadenopathy Extremities: no edema, erythema, or tenderness Neurologic: Grossly intact Psychiatric: mood appropriate, affect full  Female chaperone present for pelvic  portions of the physical exam  Wet Prep: PH: 4.5 Clue Cells: Negative Fungal elements: Negative Trichomonas: Negative   Assessment: 22 y.o. G1P0 with dysurai  Plan: Problem List Items Addressed This Visit    None    Visit Diagnoses    Painful urination    -  Primary   Relevant Orders   POCT urinalysis dipstick     1) Dysuria - negative UA, negative wet mount.  Aptima and UCx pending.  Feels symptoms are most likely secondary to uterine enlargement.  We discussed symptoms to expect with UTI.  Will contact patient with results of UCx and Aptima once available  2) A total of 15 minutes were spent in face-to-face contact with the  patient during this encounter with over half of that time devoted to counseling and coordination of care.  3) Return if symptoms worsen or fail to improve, otherwise keep your regularly scheduled appointments.   Vena AustriaAndreas Taima Rada, MD, Evern CoreFACOG [facility], [facility] 12/09/2017, 9:01 AM

## 2017-12-12 ENCOUNTER — Encounter (INDEPENDENT_AMBULATORY_CARE_PROVIDER_SITE_OTHER): Payer: Self-pay

## 2017-12-12 LAB — GC/CHLAMYDIA PROBE AMP
CHLAMYDIA, DNA PROBE: NEGATIVE
NEISSERIA GONORRHOEAE BY PCR: NEGATIVE

## 2017-12-12 LAB — URINE CULTURE

## 2017-12-13 ENCOUNTER — Observation Stay
Admission: EM | Admit: 2017-12-13 | Discharge: 2017-12-13 | Disposition: A | Discharge status: Home / Self Care | Payer: Managed Care, Other (non HMO) | Attending: Obstetrics & Gynecology | Admitting: Obstetrics & Gynecology

## 2017-12-13 ENCOUNTER — Encounter: Payer: Managed Care, Other (non HMO) | Admitting: Obstetrics and Gynecology

## 2017-12-13 DIAGNOSIS — N949 Unspecified condition associated with female genital organs and menstrual cycle: Secondary | ICD-10-CM | POA: Diagnosis present

## 2017-12-13 DIAGNOSIS — M549 Dorsalgia, unspecified: Secondary | ICD-10-CM | POA: Insufficient documentation

## 2017-12-13 DIAGNOSIS — O26892 Other specified pregnancy related conditions, second trimester: Secondary | ICD-10-CM | POA: Diagnosis present

## 2017-12-13 DIAGNOSIS — Z3A21 21 weeks gestation of pregnancy: Secondary | ICD-10-CM | POA: Diagnosis not present

## 2017-12-13 LAB — URINALYSIS, COMPLETE (UACMP) WITH MICROSCOPIC
BILIRUBIN URINE: NEGATIVE
Bacteria, UA: NONE SEEN
GLUCOSE, UA: NEGATIVE mg/dL
Hgb urine dipstick: NEGATIVE
KETONES UR: NEGATIVE mg/dL
LEUKOCYTES UA: NEGATIVE
Nitrite: NEGATIVE
PROTEIN: NEGATIVE mg/dL
Specific Gravity, Urine: 1.01 (ref 1.005–1.030)
pH: 9 — ABNORMAL HIGH (ref 5.0–8.0)

## 2017-12-13 MED ORDER — ACETAMINOPHEN 325 MG PO TABS: 650.0000 mg | ORAL_TABLET | ORAL | Status: DC | PRN

## 2017-12-13 NOTE — Discharge Summary (Signed)
  See FPN 

## 2017-12-13 NOTE — Progress Notes (Signed)
Pt. Presents with complaint of R side pain that has worsened over the past 2 days. States she cant "pee or poop" but SO states she had a small BM yesterday. States pain starts around upper R side and moves down.  Denies any bleeding or leakage of fluid

## 2017-12-13 NOTE — Progress Notes (Signed)
[facility] [id number] [place] [address] Phone: [phone] Fax: [phone]  December 13, 2017  Patient: Beth ProudSierra Willis  Date of Birth: 09/21/1995  Date of Visit: 12/13/2017    To Whom It May Concern:  Beth ProudSierra Willis was seen and treated in our [facility] on 12/13/2017. [name]  may return to work on 12/14/17.  Sincerely,  Annamarie MajorPaul Tyronda Vizcarrondo, MD [facility]
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
## 2017-12-13 NOTE — Discharge Instructions (Signed)

## 2017-12-14 ENCOUNTER — Encounter: Payer: Managed Care, Other (non HMO) | Admitting: Physical Therapy

## 2017-12-14 LAB — URINE CULTURE

## 2017-12-21 ENCOUNTER — Encounter: Payer: Managed Care, Other (non HMO) | Admitting: Physical Therapy

## 2017-12-28 ENCOUNTER — Encounter: Payer: Managed Care, Other (non HMO) | Admitting: Physical Therapy

## 2017-12-30 ENCOUNTER — Ambulatory Visit (INDEPENDENT_AMBULATORY_CARE_PROVIDER_SITE_OTHER): Payer: Managed Care, Other (non HMO)

## 2017-12-30 ENCOUNTER — Other Ambulatory Visit: Payer: Managed Care, Other (non HMO)

## 2017-12-30 ENCOUNTER — Ambulatory Visit (INDEPENDENT_AMBULATORY_CARE_PROVIDER_SITE_OTHER): Payer: Managed Care, Other (non HMO) | Admitting: Maternal Newborn

## 2017-12-30 ENCOUNTER — Encounter: Payer: Self-pay | Admitting: Maternal Newborn

## 2017-12-30 VITALS — BP 108/74 | Wt 203.0 lb

## 2017-12-30 DIAGNOSIS — Z3402 Encounter for supervision of normal first pregnancy, second trimester: Secondary | ICD-10-CM

## 2017-12-30 DIAGNOSIS — Z3401 Encounter for supervision of normal first pregnancy, first trimester: Secondary | ICD-10-CM

## 2017-12-30 DIAGNOSIS — O219 Vomiting of pregnancy, unspecified: Secondary | ICD-10-CM

## 2017-12-30 DIAGNOSIS — Z3A23 23 weeks gestation of pregnancy: Secondary | ICD-10-CM

## 2017-12-30 MED ORDER — ONDANSETRON 4 MG PO TBDP: 4.0000 mg | ORAL_TABLET | Freq: Four times a day (QID) | ORAL | 0 refills | Status: DC | PRN
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
## 2018-01-04 ENCOUNTER — Encounter: Payer: Managed Care, Other (non HMO) | Admitting: Physical Therapy

## 2018-01-05 ENCOUNTER — Encounter: Payer: Self-pay | Admitting: Advanced Practice Midwife

## 2018-01-27 ENCOUNTER — Other Ambulatory Visit: Payer: Managed Care, Other (non HMO)

## 2018-01-27 ENCOUNTER — Encounter: Payer: Self-pay | Admitting: Maternal Newborn

## 2018-01-27 ENCOUNTER — Ambulatory Visit (INDEPENDENT_AMBULATORY_CARE_PROVIDER_SITE_OTHER): Payer: Managed Care, Other (non HMO) | Admitting: Maternal Newborn

## 2018-01-27 VITALS — BP 110/80 | Wt 204.0 lb

## 2018-01-27 DIAGNOSIS — Z3401 Encounter for supervision of normal first pregnancy, first trimester: Secondary | ICD-10-CM

## 2018-01-27 DIAGNOSIS — O9989 Other specified diseases and conditions complicating pregnancy, childbirth and the puerperium: Secondary | ICD-10-CM

## 2018-01-27 DIAGNOSIS — M549 Dorsalgia, unspecified: Secondary | ICD-10-CM

## 2018-01-27 DIAGNOSIS — Z3A27 27 weeks gestation of pregnancy: Secondary | ICD-10-CM

## 2018-01-27 NOTE — Patient Instructions (Signed)
Third Trimester of Pregnancy The third trimester is from week 28 through week 40 (months 7 through 9). The third trimester is a time when the unborn baby (fetus) is growing rapidly. At the end of the ninth month, the fetus is about 20 inches in length and weighs 6-10 pounds. Body changes during your third trimester Your body will continue to go through many changes during pregnancy. The changes vary from woman to woman. During the third trimester:  Your weight will continue to increase. You can expect to gain 25-35 pounds (11-16 kg) by the end of the pregnancy.  You may begin to get stretch marks on your hips, abdomen, and breasts.  You may urinate more often because the fetus is moving lower into your pelvis and pressing on your bladder.  You may develop or continue to have heartburn. This is caused by increased hormones that slow down muscles in the digestive tract.  You may develop or continue to have constipation because increased hormones slow digestion and cause the muscles that push waste through your intestines to relax.  You may develop hemorrhoids. These are swollen veins (varicose veins) in the rectum that can itch or be painful.  You may develop swollen, bulging veins (varicose veins) in your legs.  You may have increased body aches in the pelvis, back, or thighs. This is due to weight gain and increased hormones that are relaxing your joints.  You may have changes in your hair. These can include thickening of your hair, rapid growth, and changes in texture. Some women also have hair loss during or after pregnancy, or hair that feels dry or thin. Your hair will most likely return to normal after your baby is born.  Your breasts will continue to grow and they will continue to become tender. A yellow fluid (colostrum) may leak from your breasts. This is the first milk you are producing for your baby.  Your belly button may stick out.  You may notice more swelling in your hands,  face, or ankles.  You may have increased tingling or numbness in your hands, arms, and legs. The skin on your belly may also feel numb.  You may feel short of breath because of your expanding uterus.  You may have more problems sleeping. This can be caused by the size of your belly, increased need to urinate, and an increase in your body's metabolism.  You may notice the fetus "dropping," or moving lower in your abdomen (lightening).  You may have increased vaginal discharge.  You may notice your joints feel loose and you may have pain around your pelvic bone.  What to expect at prenatal visits You will have prenatal exams every 2 weeks until week 36. Then you will have weekly prenatal exams. During a routine prenatal visit:  You will be weighed to make sure you and the baby are growing normally.  Your blood pressure will be taken.  Your abdomen will be measured to track your baby's growth.  The fetal heartbeat will be listened to.  Any test results from the previous visit will be discussed.  You may have a cervical check near your due date to see if your cervix has softened or thinned (effaced).  You will be tested for Group B streptococcus. This happens between 35 and 37 weeks.  Your health care provider may ask you:  What your birth plan is.  How you are feeling.  If you are feeling the baby move.  If you have had   any abnormal symptoms, such as leaking fluid, bleeding, severe headaches, or abdominal cramping.  If you are using any tobacco products, including cigarettes, chewing tobacco, and electronic cigarettes.  If you have any questions.  Other tests or screenings that may be performed during your third trimester include:  Blood tests that check for low iron levels (anemia).  Fetal testing to check the health, activity level, and growth of the fetus. Testing is done if you have certain medical conditions or if there are problems during the  pregnancy.  Nonstress test (NST). This test checks the health of your baby to make sure there are no signs of problems, such as the baby not getting enough oxygen. During this test, a belt is placed around your belly. The baby is made to move, and its heart rate is monitored during movement.  What is false labor? False labor is a condition in which you feel small, irregular tightenings of the muscles in the womb (contractions) that usually go away with rest, changing position, or drinking water. These are called Braxton Hicks contractions. Contractions may last for hours, days, or even weeks before true labor sets in. If contractions come at regular intervals, become more frequent, increase in intensity, or become painful, you should see your health care provider. What are the signs of labor?  Abdominal cramps.  Regular contractions that start at 10 minutes apart and become stronger and more frequent with time.  Contractions that start on the top of the uterus and spread down to the lower abdomen and back.  Increased pelvic pressure and dull back pain.  A watery or bloody mucus discharge that comes from the vagina.  Leaking of amniotic fluid. This is also known as your "water breaking." It could be a slow trickle or a gush. Let your health care provider know if it has a color or strange odor. If you have any of these signs, call your health care provider right away, even if it is before your due date. Follow these instructions at home: Medicines  Follow your health care provider's instructions regarding medicine use. Specific medicines may be either safe or unsafe to take during pregnancy.  Take a prenatal vitamin that contains at least 600 micrograms (mcg) of folic acid.  If you develop constipation, try taking a stool softener if your health care provider approves. Eating and drinking  Eat a balanced diet that includes fresh fruits and vegetables, whole grains, good sources of protein  such as meat, eggs, or tofu, and low-fat dairy. Your health care provider will help you determine the amount of weight gain that is right for you.  Avoid raw meat and uncooked cheese. These carry germs that can cause birth defects in the baby.  If you have low calcium intake from food, talk to your health care provider about whether you should take a daily calcium supplement.  Eat four or five small meals rather than three large meals a day.  Limit foods that are high in fat and processed sugars, such as fried and sweet foods.  To prevent constipation: ? Drink enough fluid to keep your urine clear or pale yellow. ? Eat foods that are high in fiber, such as fresh fruits and vegetables, whole grains, and beans. Activity  Exercise only as directed by your health care provider. Most women can continue their usual exercise routine during pregnancy. Try to exercise for 30 minutes at least 5 days a week. Stop exercising if you experience uterine contractions.  Avoid heavy   lifting.  Do not exercise in extreme heat or humidity, or at high altitudes.  Wear low-heel, comfortable shoes.  Practice good posture.  You may continue to have sex unless your health care provider tells you otherwise. Relieving pain and discomfort  Take frequent breaks and rest with your legs elevated if you have leg cramps or low back pain.  Take warm sitz baths to soothe any pain or discomfort caused by hemorrhoids. Use hemorrhoid cream if your health care provider approves.  Wear a good support bra to prevent discomfort from breast tenderness.  If you develop varicose veins: ? Wear support pantyhose or compression stockings as told by your healthcare provider. ? Elevate your feet for 15 minutes, 3-4 times a day. Prenatal care  Write down your questions. Take them to your prenatal visits.  Keep all your prenatal visits as told by your health care provider. This is important. Safety  Wear your seat belt at  all times when driving.  Make a list of emergency phone numbers, including numbers for family, friends, the hospital, and police and fire departments. General instructions  Avoid cat litter boxes and soil used by cats. These carry germs that can cause birth defects in the baby. If you have a cat, ask someone to clean the litter box for you.  Do not travel far distances unless it is absolutely necessary and only with the approval of your health care provider.  Do not use hot tubs, steam rooms, or saunas.  Do not drink alcohol.  Do not use any products that contain nicotine or tobacco, such as cigarettes and e-cigarettes. If you need help quitting, ask your health care provider.  Do not use any medicinal herbs or unprescribed drugs. These chemicals affect the formation and growth of the baby.  Do not douche or use tampons or scented sanitary pads.  Do not cross your legs for long periods of time.  To prepare for the arrival of your baby: ? Take prenatal classes to understand, practice, and ask questions about labor and delivery. ? Make a trial run to the hospital. ? Visit the hospital and tour the maternity area. ? Arrange for maternity or paternity leave through employers. ? Arrange for family and friends to take care of pets while you are in the hospital. ? Purchase a rear-facing car seat and make sure you know how to install it in your car. ? Pack your hospital bag. ? Prepare the baby's nursery. Make sure to remove all pillows and stuffed animals from the baby's crib to prevent suffocation.  Visit your dentist if you have not gone during your pregnancy. Use a soft toothbrush to brush your teeth and be gentle when you floss. Contact a health care provider if:  You are unsure if you are in labor or if your water has broken.  You become dizzy.  You have mild pelvic cramps, pelvic pressure, or nagging pain in your abdominal area.  You have lower back pain.  You have persistent  nausea, vomiting, or diarrhea.  You have an unusual or bad smelling vaginal discharge.  You have pain when you urinate. Get help right away if:  Your water breaks before 37 weeks.  You have regular contractions less than 5 minutes apart before 37 weeks.  You have a fever.  You are leaking fluid from your vagina.  You have spotting or bleeding from your vagina.  You have severe abdominal pain or cramping.  You have rapid weight loss or weight gain.    You have shortness of breath with chest pain.  You notice sudden or extreme swelling of your face, hands, ankles, feet, or legs.  Your baby makes fewer than 10 movements in 2 hours.  You have severe headaches that do not go away when you take medicine.  You have vision changes. Summary  The third trimester is from week 28 through week 40, months 7 through 9. The third trimester is a time when the unborn baby (fetus) is growing rapidly.  During the third trimester, your discomfort may increase as you and your baby continue to gain weight. You may have abdominal, leg, and back pain, sleeping problems, and an increased need to urinate.  During the third trimester your breasts will keep growing and they will continue to become tender. A yellow fluid (colostrum) may leak from your breasts. This is the first milk you are producing for your baby.  False labor is a condition in which you feel small, irregular tightenings of the muscles in the womb (contractions) that eventually go away. These are called Braxton Hicks contractions. Contractions may last for hours, days, or even weeks before true labor sets in.  Signs of labor can include: abdominal cramps; regular contractions that start at 10 minutes apart and become stronger and more frequent with time; watery or bloody mucus discharge that comes from the vagina; increased pelvic pressure and dull back pain; and leaking of amniotic fluid. This information is not intended to replace advice  given to you by your health care provider. Make sure you discuss any questions you have with your health care provider. Document Released: 06/08/2001 Document Revised: 11/20/2015 Document Reviewed: 08/15/2012 Elsevier Interactive Patient Education  2017 [employer]  
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
## 2018-01-28 LAB — 28 WEEK RH+PANEL
BASOS ABS: 0 10*3/uL (ref 0.0–0.2)
Basos: 0 %
EOS (ABSOLUTE): 0 10*3/uL (ref 0.0–0.4)
EOS: 0 %
Gestational Diabetes Screen: 140 mg/dL — ABNORMAL HIGH (ref 65–139)
HIV Screen 4th Generation wRfx: NONREACTIVE
Hematocrit: 32.8 % — ABNORMAL LOW (ref 34.0–46.6)
Hemoglobin: 10.8 g/dL — ABNORMAL LOW (ref 11.1–15.9)
IMMATURE GRANULOCYTES: 0 %
Immature Grans (Abs): 0 10*3/uL (ref 0.0–0.1)
Lymphocytes Absolute: 1.8 10*3/uL (ref 0.7–3.1)
Lymphs: 16 %
MCH: 29.7 pg (ref 26.6–33.0)
MCHC: 32.9 g/dL (ref 31.5–35.7)
MCV: 90 fL (ref 79–97)
MONOCYTES: 5 %
Monocytes Absolute: 0.6 10*3/uL (ref 0.1–0.9)
NEUTROS PCT: 79 %
Neutrophils Absolute: 8.9 10*3/uL — ABNORMAL HIGH (ref 1.4–7.0)
PLATELETS: 199 10*3/uL (ref 150–450)
RBC: 3.64 x10E6/uL — ABNORMAL LOW (ref 3.77–5.28)
RDW: 13.7 % (ref 12.3–15.4)
RPR Ser Ql: NONREACTIVE
WBC: 11.4 10*3/uL — AB (ref 3.4–10.8)

## 2018-01-30 ENCOUNTER — Telehealth: Payer: Self-pay

## 2018-01-30 NOTE — Telephone Encounter (Signed)
Pt called to get 28 week lab results. Would you please call pt in JS absence.

## 2018-02-01 ENCOUNTER — Telehealth: Payer: Self-pay

## 2018-02-01 ENCOUNTER — Other Ambulatory Visit: Payer: Self-pay | Admitting: Maternal Newborn

## 2018-02-01 DIAGNOSIS — Z3401 Encounter for supervision of normal first pregnancy, first trimester: Secondary | ICD-10-CM

## 2018-02-01 DIAGNOSIS — R7309 Other abnormal glucose: Secondary | ICD-10-CM

## 2018-02-01 DIAGNOSIS — R059 Cough, unspecified: Secondary | ICD-10-CM

## 2018-02-01 DIAGNOSIS — R05 Cough: Secondary | ICD-10-CM

## 2018-02-01 DIAGNOSIS — O99013 Anemia complicating pregnancy, third trimester: Secondary | ICD-10-CM

## 2018-02-01 MED ORDER — FERROUS SULFATE 325 (65 FE) MG PO TABS: 325.0000 mg | ORAL_TABLET | Freq: Every day | ORAL | 1 refills | Status: DC

## 2018-02-01 MED ORDER — BENZONATATE 100 MG PO CAPS: 100.0000 mg | ORAL_CAPSULE | Freq: Three times a day (TID) | ORAL | 0 refills | Status: DC | PRN

## 2018-02-01 NOTE — Telephone Encounter (Signed)
Pt calling to get better understanding of her 28 week lab results.

## 2018-02-01 NOTE — Progress Notes (Signed)
3 hour GTT ordered. Left message about scheduling lab test.

## 2018-02-02 ENCOUNTER — Other Ambulatory Visit: Payer: Managed Care, Other (non HMO)

## 2018-02-02 DIAGNOSIS — Z3401 Encounter for supervision of normal first pregnancy, first trimester: Secondary | ICD-10-CM

## 2018-02-02 DIAGNOSIS — R7309 Other abnormal glucose: Secondary | ICD-10-CM

## 2018-02-03 LAB — GESTATIONAL GLUCOSE TOLERANCE
GLUCOSE 2 HOUR GTT: 159 mg/dL — AB (ref 65–154)
Glucose, Fasting: 88 mg/dL (ref 65–94)
Glucose, GTT - 1 Hour: 178 mg/dL (ref 65–179)
Glucose, GTT - 3 Hour: 108 mg/dL (ref 65–139)

## 2018-02-03 NOTE — Telephone Encounter (Signed)
Patient is calling for labs results. Please advise. 

## 2018-02-03 NOTE — Telephone Encounter (Signed)
Patient is calling for lab results. Please advise.

## 2018-02-07 ENCOUNTER — Other Ambulatory Visit: Payer: Self-pay | Admitting: Maternal Newborn

## 2018-02-10 ENCOUNTER — Ambulatory Visit (INDEPENDENT_AMBULATORY_CARE_PROVIDER_SITE_OTHER): Payer: Managed Care, Other (non HMO) | Admitting: Obstetrics and Gynecology

## 2018-02-10 VITALS — BP 118/68 | Wt 204.0 lb

## 2018-02-10 DIAGNOSIS — Z3A29 29 weeks gestation of pregnancy: Secondary | ICD-10-CM

## 2018-02-10 DIAGNOSIS — O99333 Smoking (tobacco) complicating pregnancy, third trimester: Secondary | ICD-10-CM

## 2018-02-10 DIAGNOSIS — O26843 Uterine size-date discrepancy, third trimester: Secondary | ICD-10-CM

## 2018-02-10 DIAGNOSIS — Z87898 Personal history of other specified conditions: Secondary | ICD-10-CM

## 2018-02-10 DIAGNOSIS — O99331 Smoking (tobacco) complicating pregnancy, first trimester: Secondary | ICD-10-CM

## 2018-02-10 DIAGNOSIS — F17210 Nicotine dependence, cigarettes, uncomplicated: Secondary | ICD-10-CM

## 2018-02-10 DIAGNOSIS — Z3403 Encounter for supervision of normal first pregnancy, third trimester: Secondary | ICD-10-CM
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
## 2018-02-10 NOTE — Progress Notes (Signed)
ROB No concerns 

## 2018-02-15 ENCOUNTER — Encounter: Payer: Self-pay | Admitting: *Deleted

## 2018-02-15 ENCOUNTER — Observation Stay
Admission: EM | Admit: 2018-02-15 | Discharge: 2018-02-15 | Disposition: A | Discharge status: Home / Self Care | Payer: Managed Care, Other (non HMO) | Attending: Certified Nurse Midwife | Admitting: Certified Nurse Midwife

## 2018-02-15 DIAGNOSIS — Z7951 Long term (current) use of inhaled steroids: Secondary | ICD-10-CM | POA: Diagnosis not present

## 2018-02-15 DIAGNOSIS — Z3A30 30 weeks gestation of pregnancy: Secondary | ICD-10-CM | POA: Insufficient documentation

## 2018-02-15 DIAGNOSIS — O26893 Other specified pregnancy related conditions, third trimester: Principal | ICD-10-CM | POA: Insufficient documentation

## 2018-02-15 DIAGNOSIS — M549 Dorsalgia, unspecified: Secondary | ICD-10-CM

## 2018-02-15 DIAGNOSIS — R112 Nausea with vomiting, unspecified: Secondary | ICD-10-CM | POA: Insufficient documentation

## 2018-02-15 DIAGNOSIS — R197 Diarrhea, unspecified: Secondary | ICD-10-CM | POA: Insufficient documentation

## 2018-02-15 DIAGNOSIS — Z79899 Other long term (current) drug therapy: Secondary | ICD-10-CM | POA: Diagnosis not present

## 2018-02-15 DIAGNOSIS — O9989 Other specified diseases and conditions complicating pregnancy, childbirth and the puerperium: Secondary | ICD-10-CM

## 2018-02-15 DIAGNOSIS — Z3401 Encounter for supervision of normal first pregnancy, first trimester: Secondary | ICD-10-CM

## 2018-02-15 DIAGNOSIS — N949 Unspecified condition associated with female genital organs and menstrual cycle: Secondary | ICD-10-CM

## 2018-02-15 DIAGNOSIS — O219 Vomiting of pregnancy, unspecified: Secondary | ICD-10-CM

## 2018-02-15 HISTORY — DX: Headache: R51

## 2018-02-15 HISTORY — DX: Headache, unspecified: R51.9

## 2018-02-15 LAB — URINALYSIS, COMPLETE (UACMP) WITH MICROSCOPIC
BACTERIA UA: NONE SEEN
Bilirubin Urine: NEGATIVE
Glucose, UA: NEGATIVE mg/dL
Hgb urine dipstick: NEGATIVE
Ketones, ur: NEGATIVE mg/dL
Leukocytes, UA: NEGATIVE
Nitrite: NEGATIVE
PH: 6 (ref 5.0–8.0)
Protein, ur: NEGATIVE mg/dL
SPECIFIC GRAVITY, URINE: 1.008 (ref 1.005–1.030)

## 2018-02-15 LAB — CBC WITH DIFFERENTIAL/PLATELET
BASOS PCT: 0 %
Basophils Absolute: 0 10*3/uL (ref 0–0.1)
EOS ABS: 0 10*3/uL (ref 0–0.7)
EOS PCT: 0 %
HCT: 33.3 % — ABNORMAL LOW (ref 35.0–47.0)
Hemoglobin: 11.7 g/dL — ABNORMAL LOW (ref 12.0–16.0)
LYMPHS ABS: 1.5 10*3/uL (ref 1.0–3.6)
Lymphocytes Relative: 11 %
MCH: 31.3 pg (ref 26.0–34.0)
MCHC: 35 g/dL (ref 32.0–36.0)
MCV: 89.4 fL (ref 80.0–100.0)
MONO ABS: 0.8 10*3/uL (ref 0.2–0.9)
Monocytes Relative: 6 %
NEUTROS PCT: 83 %
Neutro Abs: 10.9 10*3/uL — ABNORMAL HIGH (ref 1.4–6.5)
PLATELETS: 155 10*3/uL (ref 150–440)
RBC: 3.72 MIL/uL — ABNORMAL LOW (ref 3.80–5.20)
RDW: 13.9 % (ref 11.5–14.5)
WBC: 13.2 10*3/uL — ABNORMAL HIGH (ref 3.6–11.0)

## 2018-02-15 LAB — URINE DRUG SCREEN, QUALITATIVE (ARMC ONLY)
Amphetamines, Ur Screen: NOT DETECTED
BARBITURATES, UR SCREEN: NOT DETECTED
CANNABINOID 50 NG, UR ~~LOC~~: NOT DETECTED
COCAINE METABOLITE, UR ~~LOC~~: NOT DETECTED
MDMA (Ecstasy)Ur Screen: NOT DETECTED
METHADONE SCREEN, URINE: NOT DETECTED
Opiate, Ur Screen: NOT DETECTED
Phencyclidine (PCP) Ur S: NOT DETECTED
Tricyclic, Ur Screen: NOT DETECTED

## 2018-02-15 MED ORDER — LACTATED RINGERS IV BOLUS
500.0000 mL | Freq: Once | INTRAVENOUS | Status: AC
  Administered 2018-02-15: 500 mL via INTRAVENOUS

## 2018-02-15 MED ORDER — OXYTOCIN BOLUS FROM INFUSION: 500.0000 mL | Freq: Once | INTRAVENOUS | Status: DC

## 2018-02-15 MED ORDER — OXYTOCIN 40 UNITS IN LACTATED RINGERS INFUSION - SIMPLE MED: 2.5000 [IU]/h | INTRAVENOUS | Status: DC

## 2018-02-15 MED ORDER — ACETAMINOPHEN 325 MG PO TABS: 650.0000 mg | ORAL_TABLET | ORAL | Status: DC | PRN

## 2018-02-15 MED ORDER — LACTATED RINGERS IV SOLN: INTRAVENOUS | Status: DC

## 2018-02-15 MED ORDER — LOPERAMIDE HCL 2 MG PO CAPS
2.0000 mg | ORAL_CAPSULE | ORAL | Status: DC | PRN
  Administered 2018-02-15: 2 mg via ORAL
  Filled 2018-02-15 (×2): qty 1

## 2018-02-15 MED ORDER — ONDANSETRON HCL 4 MG/2ML IJ SOLN: 4.0000 mg | Freq: Four times a day (QID) | INTRAMUSCULAR | Status: DC | PRN

## 2018-02-15 MED ORDER — LACTATED RINGERS IV SOLN: 500.0000 mL | INTRAVENOUS | Status: DC | PRN

## 2018-02-15 NOTE — Progress Notes (Signed)
Pt discharged home.  Discharge instructions and follow up appointment given to and reviewed with pt.  Pt verbalized understanding.  Patient ambulated out with family.

## 2018-02-15 NOTE — OB Triage Note (Signed)
Recvd pt from ED. Pt states she was woken up by stomach pain. Pt threw up at home and when she got here. She also had diarrhea when she got here to the hospital. Pt rates pain an 8 out of 10. Feeling baby move well, no LOF or vaginal bleeding.
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
## 2018-02-15 NOTE — Final Progress Note (Signed)
Physician Final Progress Note  Patient ID: Beth Willis MRN: [id number] DOB/AGE: 23/10/1995 23 y.o.  Admit date: 02/15/2018 Admitting provider: Nadara Mustardobert P Harris, MD/ Trinna Balloonolleen l. Andoni Busch, CNM Discharge date: 02/15/2018   Admission Diagnoses: IUP at 30 weeks with nausea, vomiting, and diarrhea  Discharge Diagnoses:  IUP at 30 weeks with probable gastroenteritis  Consults: None  Significant Findings/ Diagnostic Studies: [name] [facility] with complaints of sudden onset nausea, vomiting, diarrhea and abdominal cramping. She had four episodes of diarrhea during the night and a fifth episode shortly after arrival to the hospital. She was given a dose of Immodium here and has not had any further loose stools. She was treated with IV fluids initially and once the nausea and abdominal cramping passed spontaneously, she tolerated clear liquids. Fetal heart tracing was reassuring and she was acontractile. She was discharged home in stable condition. She has a prescription that she can use for nausea. Follow up as scheduled at the office.  Procedures: none  Discharge Condition: stable  Disposition: Discharge disposition: 01-Home or Self Care       Diet: advance as tolerated  Discharge Activity: Activity as tolerated   Allergies as of 02/15/2018      Reactions   Amoxicillin    Penicillins    Pollen Extract       Medication List    TAKE these medications   Butalbital-APAP-Caffeine 50-325-40 MG capsule Take 1-2 capsules by mouth every 6 (six) hours as needed for headache.   ferrous sulfate 325 (65 FE) MG tablet Take 1 tablet (325 mg total) by mouth daily with breakfast.   fluticasone 50 MCG/ACT nasal spray Commonly known as:  FLONASE USE 2 SPRAYS IN THE NOSTRILS DAILY   montelukast 10 MG tablet Commonly known as:  SINGULAIR TAKE 1 TABLET BY MOUTH EVERYDAY AT BEDTIME   ondansetron 4 MG disintegrating tablet Commonly known as:  ZOFRAN-ODT Take 1  tablet (4 mg total) by mouth every 6 (six) hours as needed for nausea.   PRENATE MINI 18-0.6-0.4-350 MG Caps Take 1 capsule by mouth daily.        Total time spent taking care of this patient: 15 minutes  Signed: Farrel Connersolleen Laronica Bhagat 02/15/2018, 10:11 AM

## 2018-02-24 ENCOUNTER — Encounter: Payer: Self-pay | Admitting: Obstetrics and Gynecology

## 2018-02-24 ENCOUNTER — Ambulatory Visit (INDEPENDENT_AMBULATORY_CARE_PROVIDER_SITE_OTHER): Payer: Managed Care, Other (non HMO) | Admitting: Obstetrics and Gynecology

## 2018-02-24 ENCOUNTER — Ambulatory Visit (INDEPENDENT_AMBULATORY_CARE_PROVIDER_SITE_OTHER): Payer: Managed Care, Other (non HMO)

## 2018-02-24 VITALS — BP 110/64 | Wt 205.0 lb

## 2018-02-24 DIAGNOSIS — Z3A31 31 weeks gestation of pregnancy: Secondary | ICD-10-CM

## 2018-02-24 DIAGNOSIS — O26843 Uterine size-date discrepancy, third trimester: Secondary | ICD-10-CM

## 2018-02-24 DIAGNOSIS — Z23 Encounter for immunization: Secondary | ICD-10-CM

## 2018-02-24 DIAGNOSIS — O99333 Smoking (tobacco) complicating pregnancy, third trimester: Secondary | ICD-10-CM

## 2018-02-24 DIAGNOSIS — Z3483 Encounter for supervision of other normal pregnancy, third trimester: Secondary | ICD-10-CM

## 2018-02-24 DIAGNOSIS — Z87898 Personal history of other specified conditions: Secondary | ICD-10-CM

## 2018-02-24 NOTE — Progress Notes (Signed)
ROB C/o constipation  Tdap/Flu shot and blood consent today
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
## 2018-03-10 ENCOUNTER — Encounter: Payer: Self-pay | Admitting: Maternal Newborn

## 2018-03-10 ENCOUNTER — Encounter: Payer: Managed Care, Other (non HMO) | Admitting: Obstetrics and Gynecology

## 2018-03-10 ENCOUNTER — Ambulatory Visit (INDEPENDENT_AMBULATORY_CARE_PROVIDER_SITE_OTHER): Payer: Managed Care, Other (non HMO) | Admitting: Maternal Newborn

## 2018-03-10 VITALS — BP 100/50 | Wt 209.0 lb

## 2018-03-10 DIAGNOSIS — Z3A33 33 weeks gestation of pregnancy: Secondary | ICD-10-CM

## 2018-03-10 DIAGNOSIS — Z3483 Encounter for supervision of other normal pregnancy, third trimester: Secondary | ICD-10-CM

## 2018-03-10 LAB — POCT URINALYSIS DIPSTICK OB
Glucose, UA: NEGATIVE
POC,PROTEIN,UA: NEGATIVE

## 2018-03-10 NOTE — Progress Notes (Signed)
ROB- no concerns 
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
## 2018-03-13 ENCOUNTER — Encounter: Payer: Self-pay | Admitting: Maternal Newborn

## 2018-03-24 ENCOUNTER — Ambulatory Visit (INDEPENDENT_AMBULATORY_CARE_PROVIDER_SITE_OTHER): Payer: Managed Care, Other (non HMO) | Admitting: Maternal Newborn

## 2018-03-24 ENCOUNTER — Other Ambulatory Visit (HOSPITAL_COMMUNITY)
Admission: RE | Admit: 2018-03-24 | Discharge: 2018-03-24 | Disposition: A | Discharge status: Home / Self Care | Payer: Managed Care, Other (non HMO) | Source: Ambulatory Visit | Attending: Maternal Newborn | Admitting: Maternal Newborn

## 2018-03-24 VITALS — BP 118/80 | Wt 213.0 lb

## 2018-03-24 DIAGNOSIS — Z3403 Encounter for supervision of normal first pregnancy, third trimester: Secondary | ICD-10-CM | POA: Insufficient documentation

## 2018-03-24 DIAGNOSIS — Z3A35 35 weeks gestation of pregnancy: Secondary | ICD-10-CM

## 2018-03-24 DIAGNOSIS — Z113 Encounter for screening for infections with a predominantly sexual mode of transmission: Secondary | ICD-10-CM

## 2018-03-24 LAB — POCT URINALYSIS DIPSTICK OB
GLUCOSE, UA: NEGATIVE
POC,PROTEIN,UA: NEGATIVE

## 2018-03-24 NOTE — Progress Notes (Signed)
ROB- no concerns 
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
## 2018-03-27 ENCOUNTER — Encounter: Payer: Self-pay | Admitting: Maternal Newborn

## 2018-03-27 ENCOUNTER — Other Ambulatory Visit: Payer: Self-pay

## 2018-03-27 ENCOUNTER — Observation Stay: Payer: Managed Care, Other (non HMO)

## 2018-03-27 ENCOUNTER — Observation Stay
Admission: EM | Admit: 2018-03-27 | Discharge: 2018-03-27 | Disposition: A | Discharge status: Home / Self Care | Payer: Managed Care, Other (non HMO) | Attending: Certified Nurse Midwife | Admitting: Certified Nurse Midwife

## 2018-03-27 DIAGNOSIS — D72829 Elevated white blood cell count, unspecified: Secondary | ICD-10-CM | POA: Diagnosis not present

## 2018-03-27 DIAGNOSIS — Z79899 Other long term (current) drug therapy: Secondary | ICD-10-CM | POA: Insufficient documentation

## 2018-03-27 DIAGNOSIS — O99343 Other mental disorders complicating pregnancy, third trimester: Secondary | ICD-10-CM | POA: Diagnosis not present

## 2018-03-27 DIAGNOSIS — N949 Unspecified condition associated with female genital organs and menstrual cycle: Secondary | ICD-10-CM

## 2018-03-27 DIAGNOSIS — O212 Late vomiting of pregnancy: Secondary | ICD-10-CM | POA: Insufficient documentation

## 2018-03-27 DIAGNOSIS — R109 Unspecified abdominal pain: Secondary | ICD-10-CM

## 2018-03-27 DIAGNOSIS — O26833 Pregnancy related renal disease, third trimester: Secondary | ICD-10-CM | POA: Diagnosis not present

## 2018-03-27 DIAGNOSIS — O99891 Other specified diseases and conditions complicating pregnancy: Secondary | ICD-10-CM

## 2018-03-27 DIAGNOSIS — Z87891 Personal history of nicotine dependence: Secondary | ICD-10-CM | POA: Insufficient documentation

## 2018-03-27 DIAGNOSIS — Z3A36 36 weeks gestation of pregnancy: Secondary | ICD-10-CM | POA: Insufficient documentation

## 2018-03-27 DIAGNOSIS — N1330 Unspecified hydronephrosis: Secondary | ICD-10-CM | POA: Diagnosis not present

## 2018-03-27 DIAGNOSIS — M549 Dorsalgia, unspecified: Secondary | ICD-10-CM

## 2018-03-27 DIAGNOSIS — O99333 Smoking (tobacco) complicating pregnancy, third trimester: Secondary | ICD-10-CM

## 2018-03-27 DIAGNOSIS — Z880 Allergy status to penicillin: Secondary | ICD-10-CM | POA: Diagnosis not present

## 2018-03-27 DIAGNOSIS — F988 Other specified behavioral and emotional disorders with onset usually occurring in childhood and adolescence: Secondary | ICD-10-CM | POA: Diagnosis not present

## 2018-03-27 DIAGNOSIS — O9989 Other specified diseases and conditions complicating pregnancy, childbirth and the puerperium: Principal | ICD-10-CM | POA: Insufficient documentation

## 2018-03-27 DIAGNOSIS — R51 Headache: Secondary | ICD-10-CM | POA: Insufficient documentation

## 2018-03-27 DIAGNOSIS — O26893 Other specified pregnancy related conditions, third trimester: Secondary | ICD-10-CM | POA: Diagnosis present

## 2018-03-27 DIAGNOSIS — R110 Nausea: Secondary | ICD-10-CM

## 2018-03-27 DIAGNOSIS — Z3403 Encounter for supervision of normal first pregnancy, third trimester: Secondary | ICD-10-CM

## 2018-03-27 DIAGNOSIS — R519 Headache, unspecified: Secondary | ICD-10-CM | POA: Diagnosis present

## 2018-03-27 HISTORY — DX: Other psychoactive substance abuse, in remission: F19.11

## 2018-03-27 HISTORY — DX: Other specified behavioral and emotional disorders with onset usually occurring in childhood and adolescence: F98.8

## 2018-03-27 LAB — CERVICOVAGINAL ANCILLARY ONLY
Chlamydia: NEGATIVE
Neisseria Gonorrhea: NEGATIVE

## 2018-03-27 LAB — COMPREHENSIVE METABOLIC PANEL
ALK PHOS: 109 U/L (ref 38–126)
ALT: 10 U/L (ref 0–44)
AST: 17 U/L (ref 15–41)
Albumin: 2.9 g/dL — ABNORMAL LOW (ref 3.5–5.0)
Anion gap: 6 (ref 5–15)
BILIRUBIN TOTAL: 0.6 mg/dL (ref 0.3–1.2)
BUN: 6 mg/dL (ref 6–20)
CALCIUM: 8.6 mg/dL — AB (ref 8.9–10.3)
CO2: 21 mmol/L — ABNORMAL LOW (ref 22–32)
CREATININE: 0.55 mg/dL (ref 0.44–1.00)
Chloride: 108 mmol/L (ref 98–111)
GFR calc Af Amer: >60 mL/min (ref >60)
Glucose, Bld: 82 mg/dL (ref 70–99)
Potassium: 3.7 mmol/L (ref 3.5–5.1)
Sodium: 135 mmol/L (ref 135–145)
TOTAL PROTEIN: 6 g/dL — AB (ref 6.5–8.1)

## 2018-03-27 LAB — CBC
HEMATOCRIT: 35.4 % (ref 35.0–47.0)
Hemoglobin: 12.4 g/dL (ref 12.0–16.0)
MCH: 31.2 pg (ref 26.0–34.0)
MCHC: 35 g/dL (ref 32.0–36.0)
MCV: 89.2 fL (ref 80.0–100.0)
Platelets: 128 10*3/uL — ABNORMAL LOW (ref 150–440)
RBC: 3.97 MIL/uL (ref 3.80–5.20)
RDW: 14.3 % (ref 11.5–14.5)
WBC: 14.3 10*3/uL — AB (ref 3.6–11.0)

## 2018-03-27 LAB — URINE DRUG SCREEN, QUALITATIVE (ARMC ONLY)
Amphetamines, Ur Screen: NOT DETECTED
BARBITURATES, UR SCREEN: NOT DETECTED
Benzodiazepine, Ur Scrn: NOT DETECTED
CANNABINOID 50 NG, UR ~~LOC~~: NOT DETECTED
COCAINE METABOLITE, UR ~~LOC~~: NOT DETECTED
MDMA (Ecstasy)Ur Screen: NOT DETECTED
Methadone Scn, Ur: NOT DETECTED
OPIATE, UR SCREEN: NOT DETECTED
Phencyclidine (PCP) Ur S: NOT DETECTED
TRICYCLIC, UR SCREEN: NOT DETECTED

## 2018-03-27 LAB — PROTEIN / CREATININE RATIO, URINE
CREATININE, URINE: 87 mg/dL
Protein Creatinine Ratio: 0.15 mg/mg{Cre} (ref 0.00–0.15)
Total Protein, Urine: 13 mg/dL

## 2018-03-27 LAB — URINALYSIS, COMPLETE (UACMP) WITH MICROSCOPIC
BACTERIA UA: NONE SEEN
Bilirubin Urine: NEGATIVE
Glucose, UA: NEGATIVE mg/dL
HGB URINE DIPSTICK: NEGATIVE
Ketones, ur: 20 mg/dL — AB
LEUKOCYTES UA: NEGATIVE
Nitrite: NEGATIVE
PROTEIN: NEGATIVE mg/dL
Specific Gravity, Urine: 1.014 (ref 1.005–1.030)
pH: 7 (ref 5.0–8.0)

## 2018-03-27 LAB — MAGNESIUM: Magnesium: 1.6 mg/dL — ABNORMAL LOW (ref 1.7–2.4)

## 2018-03-27 LAB — LIPASE, BLOOD: LIPASE: 23 U/L (ref 11–51)

## 2018-03-27 MED ORDER — SODIUM CHLORIDE 0.9 % IJ SOLN
INTRAMUSCULAR | Status: AC
  Filled 2018-03-27: qty 50

## 2018-03-27 MED ORDER — SODIUM CHLORIDE 0.9 % IV SOLN
INTRAVENOUS | Status: DC
  Administered 2018-03-27 (×2): via INTRAVENOUS

## 2018-03-27 MED ORDER — BUTALBITAL-APAP-CAFFEINE 50-325-40 MG PO TABS
2.0000 | ORAL_TABLET | Freq: Once | ORAL | Status: AC
  Administered 2018-03-27: 2 via ORAL
  Filled 2018-03-27: qty 2

## 2018-03-27 MED ORDER — SODIUM CHLORIDE 0.9 % IV BOLUS
500.0000 mL | Freq: Once | INTRAVENOUS | Status: AC
  Administered 2018-03-27: 500 mL via INTRAVENOUS

## 2018-03-27 MED ORDER — FAMOTIDINE IN NACL 20-0.9 MG/50ML-% IV SOLN
20.0000 mg | Freq: Two times a day (BID) | INTRAVENOUS | Status: DC
  Administered 2018-03-27: 20 mg via INTRAVENOUS
  Filled 2018-03-27 (×3): qty 50

## 2018-03-27 MED ORDER — PROMETHAZINE HCL 25 MG/ML IJ SOLN
12.5000 mg | INTRAMUSCULAR | Status: DC | PRN
  Administered 2018-03-27: 12.5 mg via INTRAVENOUS
  Filled 2018-03-27: qty 1

## 2018-03-27 NOTE — OB Triage Note (Signed)
Pt. reports nausea and vomiting starting this AM with 4-5 episodes of vomiting since waking, being unable to even keep down water.  She also has a 10/10 "migraine-type" headache as well as 9/10 right epigastric pain.  Reports seeing floaters over the weekend, but not since.  No swelling noted in hands, face, or feet, though pt. reports that feet are more swollen than normal.  Reflexes +1 with no beats of clonus noted.  Lung sounds clear bilaterally.  Pt. reports chest pain and that her entire "insides ache like the flu."

## 2018-03-27 NOTE — H&P (Signed)
OB History & Physical   History of Present Illness:  Chief Complaint:  Nausea, vomiting, headache, abdominal pain since 0800 this AM. HPI:  Beth Willis is a 22 y.o. G1P0 female with EDC=04/23/2018 at [redacted]w[redacted]d dated by an 8wk2d ultrasound.  She presents to L&D with complaints of nausea, vomiting, and headache since 0800 this AM. She reports vomiting when she first got up this AM at 0800 and thought that was related to indigestion and heartburn. She went back to sleep until 1130 and then has had 3-4 more episodes of nausea and vomiting. Last ate at 9PM last night (tacos). Has not been able to keep down water today. Denies diarrhea. Feels hot and cold and feels weak. She has had a headache since 0800-mostly frontal, but also right temporal. Sometimes feels like her whole head is in a vice. Has a history of migraines. Has not taken any analgesics for the headache.  Has been feeling tightening in her lower and upper abdomen and it radiates to her chest and to her back. No vaginal bleeding or leakage of water. Was in [place] this weekend for a baby shower and her and her partner had some nasal congestion and rhinorrhea and headache and began taking Claritin.  Her pregnancy has been complicated by a history of migraines, MJ and tobacco use in pregnancy, history of substance abuse prior to pregnancy (cocaine, acid, methamphetamines-last used 2017), ADD, round ligament pain, an elevated 1 hour GTT with a 3 hour GTT with 1 out of 4 abnormal values, and lower back pain in pregnancy requiring PT.  Prenatal care site: Prenatal care at [facility] has also been remarkable for a total weight gain of 43# and the following  [facility] Prenatal Labs  Dating Ultrasound 09/13/2017 Blood type: A, Positive-- (03/13 0000)   Genetic Screen : NIPS: normal XY Antibody:Negative (03/13 0000)  Anatomic [place] complete Rubella: 1.58 (04/02 1610) Varicella: Immune (03/13 0000)  GTT Early:                Third trimester:   3hr: 1  of 4 values elevated RPR: Nonreactive (03/13 0000)   Rhogam  not applicable HBsAg: Negative (03/13 0000)   TDaP vaccine   02/24/18                     Flu Shot: 09/06/17& 02/24/18 HIV: Non Reactive (04/02 0936)   Baby Food Breast and formula                            GBS: pending  Contraception  Nuvaring Pap: NIL 2019  CBB     CS/VBAC    Support Person  Alex           Maternal Medical History:   Past Medical History:  Diagnosis Date  . ADD (attention deficit disorder)   . Headache   . History of substance abuse     Past Surgical History:  Procedure Laterality Date  . TONSILLECTOMY  2014   Pt not sure if addenoids taken  . WISDOM TOOTH EXTRACTION  2014    Allergies  Allergen Reactions  . Amoxicillin   . Penicillins   . Pollen Extract     Medication:  No current facility-administered medications on file prior to encounter.    Current Outpatient Medications on File Prior to Encounter  Medication Sig Dispense Refill  . ferrous sulfate (FERROUSUL) 325 (65 FE) MG tablet Take 1 tablet (325 mg total)  by mouth daily with breakfast. 60 tablet 1  . loratadine (CLARITIN) 10 MG tablet Take 10 mg by mouth daily.    . montelukast (SINGULAIR) 10 MG tablet TAKE 1 TABLET BY MOUTH EVERYDAY AT BEDTIME  6  . Prenat-FeCbn-FeAsp-Meth-FA-DHA (PRENATE MINI) 18-0.6-0.4-350 MG CAPS Take 1 capsule by mouth daily. 30 capsule 8  . Butalbital-APAP-Caffeine 50-325-40 MG capsule Take 1-2 capsules by mouth every 6 (six) hours as needed for headache. (Patient not taking: Reported on 03/27/2018) 30 capsule 3  . fluticasone (FLONASE) 50 MCG/ACT nasal spray USE 2 SPRAYS IN THE NOSTRILS DAILY  0  . ondansetron (ZOFRAN ODT) 4 MG disintegrating tablet Take 1 tablet (4 mg total) by mouth every 6 (six) hours as needed for nausea. (Patient not taking: Reported on 03/27/2018) 20 tablet 0    Social History: She  reports that she has quit smoking. Her smoking use included cigarettes. She smoked 0.50 packs per day.  She has never used smokeless tobacco. She reports that she has current or past drug history. Drug: Marijuana. She reports that she does not drink alcohol.  Family History: family history includes Diabetes in her maternal grandmother and paternal aunt; Hypertension in her father, maternal aunt, maternal grandfather, maternal grandmother, maternal uncle, mother, paternal aunt, paternal grandfather, paternal grandmother, and paternal uncle; Thyroid disease in her maternal grandfather, maternal grandmother, paternal grandfather, and paternal grandmother.   Review of Systems: Negative x 10 systems reviewed except as noted in the HPI.      Physical Exam:  Vital Signs: BP (!) 95/50 (BP Location: Left Arm)   Pulse (!) 103   Temp 97.8 F (36.6 C) (Oral)   Resp 16   LMP 06/17/2017 (Exact Date)  General: gravid WF in no acute distress.  HEENT: normocephalic, atraumatic  Pupils equally round, conjunctiva not icteric  Nasal turbinates: inflamed  OP: no inflammation or exudates  Heart: regular rate & rhythm.  No murmurs/rubs/gallops Lungs: clear to auscultation bilaterally Abdomen: soft, gravid, generalized tenderness to palpation, BS active, cephalic Pelvic:    Extremities: non-tender, symmetric, trace edema bilaterally.  DTRs: +2  Neurologic: Alert & oriented x 3.    Baseline FHR: 145-150 with accelerations to 170s-180, moderate variability Toco: contractions every 3-5 minutes apart, duration: 30-60 sec.   Assessment:  Beth Willis is a 22 y.o. G1P0 female at [redacted]w[redacted]d with nausea, vomiting, headache and abdominal pain/contractions-probable acute gastroenteritis. R/O pancreatitis. R/O appendicitis. R/O chorio  Plan:  1. Admit to Labor & Delivery for observation 2. CBC, CMP, PC ratio, urinalysis, magnesium, lipase 3. NS bolus then 150 ml/hr 4. Consents obtained. 5. Phenergan for pain and headache and Pepcid for heartburn   Farrel Conners  03/27/2018 10:31 PM

## 2018-03-27 NOTE — Final Progress Note (Signed)
Physician Final Progress Note  Patient ID: Beth Willis MRN: [id number] DOB/AGE: 03-Dec-1995 22 y.o.  Admit date: 03/27/2018 Admitting provider: Conard Novak, MD Discharge date: 03/27/2018   Admission Diagnoses: IUP at 36wk1day with nausea/vomiting/headache and abdominal pain  Discharge Diagnoses:  IUP at 36wk1 day with probable gastroenteritis Headache Preterm contractions-resolved  Consults: None  Significant Findings/ Diagnostic Studies: 22 y.o. G1P0 female with EDC=04/23/2018 at [redacted]w[redacted]d dated by an 8wk2d ultrasound.  She presented to L&D with complaints of nausea, vomiting, and headache since 0800. Last ate at 9PM last night. No diarrhea. Felt weak and was having hot and cold flashes. Was feeling tightening in her lower and upper abdomen that radiated to her chest and to her back. No vaginal bleeding or leakage of water. Baby active. On arrival, she was having contractions every 3-5 minutes apart and there was generalized abdominal tenderness. She was afebrile. FHR tracing was reactive. She was given Phenergan IV which relieved her nausea and she had no further vomiting. Her contractions resolved with IV hydration but she continued to have lower abdominal pain. Her lab work was normal including electrolytes, lipase and liver functions except for a magnesium level of 1.6, a WBC=14, 300, and platelets of 128K. MRI was done to rule out appendicitis-and the MRI was normal except for some right hydronephrosis from compression by the gravid uterus.  After having the negative MRI, she was given Fioricet for her headache which decreased her headache pain from an 8 to a 2/10. She was also able to tolerate fluids and crackers. Her abdominal pain also resolved  She felt better and wanted to go home. She was discharged home per her request. To follow up in the office on 4 Oct as scheduled.    Procedures: MRI of abdomen and pelvis Mr Pelvis Wo Contrast  Result Date: 03/27/2018 CLINICAL DATA:   Thirty-six weeks pregnant, nausea/vomiting, leukocytosis, abdominal pain. Evaluate for cholelithiasis and appendicitis. EXAM: MRI ABDOMEN AND PELVIS WITHOUT CONTRAST TECHNIQUE: Multiplanar multisequence MR imaging of the abdomen and pelvis was performed. No intravenous contrast was administered. COMPARISON:  None. FINDINGS: Lower chest: Lung bases are clear. Hepatobiliary: Liver is within normal limits. Gallbladder is unremarkable. No cholelithiasis, gallbladder wall thickening, or pericholecystic fluid. No intrahepatic or extrahepatic ductal dilatation. No choledocholithiasis is seen. Pancreas:  Within normal limits. Spleen:  Within normal limits. Adrenals/Urinary Tract:  Adrenal glands within normal limits. Right kidney is notable for moderate hydroureteronephrosis, likely secondary to extrinsic compression by the gravid uterus. Left kidney is within normal limits. Bladder is decompressed. Stomach/Bowel: Stomach is within normal limits. No evidence of bowel obstruction. Suspected appendix is normal (series 11/image 45). Vascular/Lymphatic:  No evidence of abdominal aortic aneurysm. No suspicious abdominal lymphadenopathy. Reproductive: Gravid uterus. Left lateral placenta, free of the cervical os. Dedicated fetal evaluation was not performed. Cephalic presentation. Other:  No abdominopelvic ascites. Musculoskeletal: No focal osseous lesions. IMPRESSION: No evidence of appendicitis. No evidence of cholelithiasis or choledocholithiasis. Moderate right hydroureteronephrosis, likely secondary to extrinsic compression by the gravid uterus. Electronically Signed   By: Charline Bills M.D.   On: 03/27/2018 20:14   Mr Abdomen Wo Contrast  Result Date: 03/27/2018 CLINICAL DATA:  Thirty-six weeks pregnant, nausea/vomiting, leukocytosis, abdominal pain. Evaluate for cholelithiasis and appendicitis. EXAM: MRI ABDOMEN AND PELVIS WITHOUT CONTRAST TECHNIQUE: Multiplanar multisequence MR imaging of the abdomen and pelvis  was performed. No intravenous contrast was administered. COMPARISON:  None. FINDINGS: Lower chest: Lung bases are clear. Hepatobiliary: Liver is within normal limits. Gallbladder is unremarkable. No  cholelithiasis, gallbladder wall thickening, or pericholecystic fluid. No intrahepatic or extrahepatic ductal dilatation. No choledocholithiasis is seen. Pancreas:  Within normal limits. Spleen:  Within normal limits. Adrenals/Urinary Tract:  Adrenal glands within normal limits. Right kidney is notable for moderate hydroureteronephrosis, likely secondary to extrinsic compression by the gravid uterus. Left kidney is within normal limits. Bladder is decompressed. Stomach/Bowel: Stomach is within normal limits. No evidence of bowel obstruction. Suspected appendix is normal (series 11/image 45). Vascular/Lymphatic:  No evidence of abdominal aortic aneurysm. No suspicious abdominal lymphadenopathy. Reproductive: Gravid uterus. Left lateral placenta, free of the cervical os. Dedicated fetal evaluation was not performed. Cephalic presentation. Other:  No abdominopelvic ascites. Musculoskeletal: No focal osseous lesions. IMPRESSION: No evidence of appendicitis. No evidence of cholelithiasis or choledocholithiasis. Moderate right hydroureteronephrosis, likely secondary to extrinsic compression by the gravid uterus. Electronically Signed   By: Charline Bills M.D.   On: 03/27/2018 20:14    Discharge Condition: stable  Disposition: Discharge disposition: 01-Home or Self Care       Diet: Clear liquid diet, advance as tolerated  Discharge Activity: Activity as tolerated   Allergies as of 03/27/2018      Reactions   Amoxicillin    Penicillins    Pollen Extract       Medication List    TAKE these medications   Butalbital-APAP-Caffeine 50-325-40 MG capsule Take 1-2 capsules by mouth every 6 (six) hours as needed for headache.   ferrous sulfate 325 (65 FE) MG tablet Take 1 tablet (325 mg total) by mouth  daily with breakfast.   fluticasone 50 MCG/ACT nasal spray Commonly known as:  FLONASE USE 2 SPRAYS IN THE NOSTRILS DAILY   loratadine 10 MG tablet Commonly known as:  CLARITIN Take 10 mg by mouth daily.   montelukast 10 MG tablet Commonly known as:  SINGULAIR TAKE 1 TABLET BY MOUTH EVERYDAY AT BEDTIME   ondansetron 4 MG disintegrating tablet Commonly known as:  ZOFRAN-ODT Take 1 tablet (4 mg total) by mouth every 6 (six) hours as needed for nausea.   PRENATE MINI 18-0.6-0.4-350 MG Caps Take 1 capsule by mouth daily.        Total time spent taking care of this patient: 30 minutes  Signed: Farrel Conners 03/27/2018, 10:20 PM

## 2018-03-27 NOTE — Discharge Instructions (Signed)
Please call or return to the Birthplace at [facility] with any of the following concerns: Vaginal Bleeding Leaking of Fluid Decreased Fetal Movement Contractions every 3-5 mins >1hr

## 2018-03-27 NOTE — Progress Notes (Signed)
L&D Progress Note   S: Still having lower abdominal pain. No further vomiting since she arrived. Able to sleep a little after the Phenergan. Starting to get appetite.   O: 98.3-88-16 115/68  General: appears sleepy. In NAD Abdomen: mild tenderness over left and right lower quadrants, soft, upper abdomen no longer tender to palpation FHR: 140-145 with accelerations to 160s, moderate variability Toco: no contractions seen. Cervix: closed/long/-3/ cephalic  Results for orders placed or performed during the hospital encounter of 03/27/18 (from the past 24 hour(s))  CBC     Status: Abnormal   Collection Time: 03/27/18  2:35 PM  Result Value Ref Range   WBC 14.3 (H) 3.6 - 11.0 K/uL   RBC 3.97 3.80 - 5.20 MIL/uL   Hemoglobin 12.4 12.0 - 16.0 g/dL   HCT 09.8 11.9 - 14.7 %   MCV 89.2 80.0 - 100.0 fL   MCH 31.2 26.0 - 34.0 pg   MCHC 35.0 32.0 - 36.0 g/dL   RDW 82.9 56.2 - 13.0 %   Platelets 128 (L) 150 - 440 K/uL  Comprehensive metabolic panel     Status: Abnormal   Collection Time: 03/27/18  2:35 PM  Result Value Ref Range   Sodium 135 135 - 145 mmol/L   Potassium 3.7 3.5 - 5.1 mmol/L   Chloride 108 98 - 111 mmol/L   CO2 21 (L) 22 - 32 mmol/L   Glucose, Bld 82 70 - 99 mg/dL   BUN 6 6 - 20 mg/dL   Creatinine, Ser 8.65 0.44 - 1.00 mg/dL   Calcium 8.6 (L) 8.9 - 10.3 mg/dL   Total Protein 6.0 (L) 6.5 - 8.1 g/dL   Albumin 2.9 (L) 3.5 - 5.0 g/dL   AST 17 15 - 41 U/L   ALT 10 0 - 44 U/L   Alkaline Phosphatase 109 38 - 126 U/L   Total Bilirubin 0.6 0.3 - 1.2 mg/dL   GFR calc non Af Amer >60 >60 mL/min   GFR calc Af Amer >60 >60 mL/min   Anion gap 6 5 - 15  Lipase, blood     Status: None   Collection Time: 03/27/18  2:35 PM  Result Value Ref Range   Lipase 23 11 - 51 U/L  Magnesium     Status: Abnormal   Collection Time: 03/27/18  2:35 PM  Result Value Ref Range   Magnesium 1.6 (L) 1.7 - 2.4 mg/dL  Urinalysis, Complete w Microscopic     Status: Abnormal   Collection Time:  03/27/18  2:36 PM  Result Value Ref Range   Color, Urine YELLOW (A) YELLOW   APPearance CLEAR (A) CLEAR   Specific Gravity, Urine 1.014 1.005 - 1.030   pH 7.0 5.0 - 8.0   Glucose, UA NEGATIVE NEGATIVE mg/dL   Hgb urine dipstick NEGATIVE NEGATIVE   Bilirubin Urine NEGATIVE NEGATIVE   Ketones, ur 20 (A) NEGATIVE mg/dL   Protein, ur NEGATIVE NEGATIVE mg/dL   Nitrite NEGATIVE NEGATIVE   Leukocytes, UA NEGATIVE NEGATIVE   RBC / HPF 0-5 0 - 5 RBC/hpf   WBC, UA 0-5 0 - 5 WBC/hpf   Bacteria, UA NONE SEEN NONE SEEN   Squamous Epithelial / LPF 6-10 0 - 5   Mucus PRESENT    Non Squamous Epithelial PRESENT (A) NONE SEEN  Protein / creatinine ratio, urine     Status: None   Collection Time: 03/27/18  2:36 PM  Result Value Ref Range   Creatinine, Urine 87 mg/dL  Total Protein, Urine 13 mg/dL   Protein Creatinine Ratio 0.15 0.00 - 0.15 mg/mg[Cre]  Urine Drug Screen, Qualitative ([facility] only)     Status: None   Collection Time: 03/27/18  2:36 PM  Result Value Ref Range   Tricyclic, Ur Screen NONE DETECTED NONE DETECTED   Amphetamines, Ur Screen NONE DETECTED NONE DETECTED   MDMA (Ecstasy)Ur Screen NONE DETECTED NONE DETECTED   Cocaine Metabolite,Ur [place] NONE DETECTED NONE DETECTED   Opiate, Ur Screen NONE DETECTED NONE DETECTED   Phencyclidine (PCP) Ur S NONE DETECTED NONE DETECTED   Cannabinoid 50 Ng, Ur  NONE DETECTED NONE DETECTED   Barbiturates, Ur Screen NONE DETECTED NONE DETECTED   Benzodiazepine, Ur Scrn NONE DETECTED NONE DETECTED   Methadone Scn, Ur NONE DETECTED NONE DETECTED    A: Abdominal pain persists No evidence of preterm labor FWB: Cat 1 tracing  P: DC fetal monitoring/ toco Discussed case with Dr Jean Rosenthal MRI of abdomen and pelvis ordered to R/O appendicitis. Keep NPO until she has MRI  Farrel Conners, CNM

## 2018-03-28 LAB — STREP GP B CULTURE+RFLX: Strep Gp B Culture+Rflx: NEGATIVE

## 2018-03-31 ENCOUNTER — Encounter: Payer: Self-pay | Admitting: Advanced Practice Midwife

## 2018-03-31 ENCOUNTER — Ambulatory Visit (INDEPENDENT_AMBULATORY_CARE_PROVIDER_SITE_OTHER): Payer: Managed Care, Other (non HMO) | Admitting: Advanced Practice Midwife

## 2018-03-31 VITALS — BP 122/74 | Wt 213.0 lb

## 2018-03-31 DIAGNOSIS — F17210 Nicotine dependence, cigarettes, uncomplicated: Secondary | ICD-10-CM

## 2018-03-31 DIAGNOSIS — O99321 Drug use complicating pregnancy, first trimester: Secondary | ICD-10-CM

## 2018-03-31 DIAGNOSIS — Z3A36 36 weeks gestation of pregnancy: Secondary | ICD-10-CM

## 2018-03-31 DIAGNOSIS — O99333 Smoking (tobacco) complicating pregnancy, third trimester: Secondary | ICD-10-CM

## 2018-03-31 DIAGNOSIS — O99320 Drug use complicating pregnancy, unspecified trimester: Secondary | ICD-10-CM | POA: Insufficient documentation

## 2018-03-31 LAB — POCT URINALYSIS DIPSTICK OB
GLUCOSE, UA: NEGATIVE
PROTEIN: NEGATIVE

## 2018-03-31 NOTE — Progress Notes (Signed)
No vb. No lof.  
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
## 2018-04-07 ENCOUNTER — Encounter: Payer: Self-pay | Admitting: Advanced Practice Midwife

## 2018-04-07 ENCOUNTER — Encounter: Payer: Managed Care, Other (non HMO) | Admitting: Advanced Practice Midwife

## 2018-04-07 ENCOUNTER — Ambulatory Visit (INDEPENDENT_AMBULATORY_CARE_PROVIDER_SITE_OTHER): Payer: Managed Care, Other (non HMO) | Admitting: Advanced Practice Midwife

## 2018-04-07 VITALS — BP 102/70 | Wt 214.0 lb

## 2018-04-07 DIAGNOSIS — Z3A37 37 weeks gestation of pregnancy: Secondary | ICD-10-CM

## 2018-04-07 DIAGNOSIS — O9989 Other specified diseases and conditions complicating pregnancy, childbirth and the puerperium: Secondary | ICD-10-CM

## 2018-04-07 DIAGNOSIS — M549 Dorsalgia, unspecified: Secondary | ICD-10-CM

## 2018-04-07 LAB — POCT URINALYSIS DIPSTICK OB: GLUCOSE, UA: NEGATIVE

## 2018-04-07 NOTE — Patient Instructions (Signed)
Vaginal Delivery Vaginal delivery means that you will give birth by pushing your baby out of your birth canal (vagina). A team of health care providers will help you before, during, and after vaginal delivery. Birth experiences are unique for every woman and every pregnancy, and birth experiences vary depending on where you choose to give birth. What should I do to prepare for my baby's birth? Before your baby is born, it is important to talk with your health care provider about:  Your labor and delivery preferences. These may include: ? Medicines that you may be given. ? How you will manage your pain. This might include non-medical pain relief techniques or injectable pain relief such as epidural analgesia. ? How you and your baby will be monitored during labor and delivery. ? Who may be in the labor and delivery room with you. ? Your feelings about surgical delivery of your baby (cesarean delivery, or C-section) if this becomes necessary. ? Your feelings about receiving donated blood through an IV tube (blood transfusion) if this becomes necessary.  Whether you are able: ? To take pictures or videos of the birth. ? To eat during labor and delivery. ? To move around, walk, or change positions during labor and delivery.  What to expect after your baby is born, such as: ? Whether delayed umbilical cord clamping and cutting is offered. ? Who will care for your baby right after birth. ? Medicines or tests that may be recommended for your baby. ? Whether breastfeeding is supported in your hospital or birth center. ? How long you will be in the hospital or birth center.  How any medical conditions you have may affect your baby or your labor and delivery experience.  To prepare for your baby's birth, you should also:  Attend all of your health care visits before delivery (prenatal visits) as recommended by your health care provider. This is important.  Prepare your home for your baby's  arrival. Make sure that you have: ? Diapers. ? Baby clothing. ? Feeding equipment. ? Safe sleeping arrangements for you and your baby.  Install a car seat in your vehicle. Have your car seat checked by a certified car seat installer to make sure that it is installed safely.  Think about who will help you with your new baby at home for at least the first several weeks after delivery.  What can I expect when I arrive at the birth center or hospital? Once you are in labor and have been admitted into the hospital or birth center, your health care provider may:  Review your pregnancy history and any concerns you have.  Insert an IV tube into one of your veins. This is used to give you fluids and medicines.  Check your blood pressure, pulse, temperature, and heart rate (vital signs).  Check whether your bag of water (amniotic sac) has broken (ruptured).  Talk with you about your birth plan and discuss pain control options.  Monitoring Your health care provider may monitor your contractions (uterine monitoring) and your baby's heart rate (fetal monitoring). You may need to be monitored:  Often, but not continuously (intermittently).  All the time or for long periods at a time (continuously). Continuous monitoring may be needed if: ? You are taking certain medicines, such as medicine to relieve pain or make your contractions stronger. ? You have pregnancy or labor complications.  Monitoring may be done by:  Placing a special stethoscope or a handheld monitoring device on your abdomen to   check your baby's heartbeat, and feeling your abdomen for contractions. This method of monitoring does not continuously record your baby's heartbeat or your contractions.  Placing monitors on your abdomen (external monitors) to record your baby's heartbeat and the frequency and length of contractions. You may not have to wear external monitors all the time.  Placing monitors inside of your uterus  (internal monitors) to record your baby's heartbeat and the frequency, length, and strength of your contractions. ? Your health care provider may use internal monitors if he or she needs more information about the strength of your contractions or your baby's heart rate. ? Internal monitors are put in place by passing a thin, flexible wire through your vagina and into your uterus. Depending on the type of monitor, it may remain in your uterus or on your baby's head until birth. ? Your health care provider will discuss the benefits and risks of internal monitoring with you and will ask for your permission before inserting the monitors.  Telemetry. This is a type of continuous monitoring that can be done with external or internal monitors. Instead of having to stay in bed, you are able to move around during telemetry. Ask your health care provider if telemetry is an option for you.  Physical exam Your health care provider may perform a physical exam. This may include:  Checking whether your baby is positioned: ? With the head toward your vagina (head-down). This is most common. ? With the head toward the top of your uterus (head-up or breech). If your baby is in a breech position, your health care provider may try to turn your baby to a head-down position so you can deliver vaginally. If it does not seem that your baby can be born vaginally, your provider may recommend surgery to deliver your baby. In rare cases, you may be able to deliver vaginally if your baby is head-up (breech delivery). ? Lying sideways (transverse). Babies that are lying sideways cannot be delivered vaginally.  Checking your cervix to determine: ? Whether it is thinning out (effacing). ? Whether it is opening up (dilating). ? How low your baby has moved into your birth canal.  What are the three stages of labor and delivery?  Normal labor and delivery is divided into the following three stages: Stage 1  Stage 1 is the  longest stage of labor, and it can last for hours or days. Stage 1 includes: ? Early labor. This is when contractions may be irregular, or regular and mild. Generally, early labor contractions are more than 10 minutes apart. ? Active labor. This is when contractions get longer, more regular, more frequent, and more intense. ? The transition phase. This is when contractions happen very close together, are very intense, and may last longer than during any other part of labor.  Contractions generally feel mild, infrequent, and irregular at first. They get stronger, more frequent (about every 2-3 minutes), and more regular as you progress from early labor through active labor and transition.  Many women progress through stage 1 naturally, but you may need help to continue making progress. If this happens, your health care provider may talk with you about: ? Rupturing your amniotic sac if it has not ruptured yet. ? Giving you medicine to help make your contractions stronger and more frequent.  Stage 1 ends when your cervix is completely dilated to 4 inches (10 cm) and completely effaced. This happens at the end of the transition phase. Stage 2  Once   your cervix is completely effaced and dilated to 4 inches (10 cm), you may start to feel an urge to push. It is common for the body to naturally take a rest before feeling the urge to push, especially if you received an epidural or certain other pain medicines. This rest period may last for up to 1-2 hours, depending on your unique labor experience.  During stage 2, contractions are generally less painful, because pushing helps relieve contraction pain. Instead of contraction pain, you may feel stretching and burning pain, especially when the widest part of your baby's head passes through the vaginal opening (crowning).  Your health care provider will closely monitor your pushing progress and your baby's progress through the vagina during stage 2.  Your  health care provider may massage the area of skin between your vaginal opening and anus (perineum) or apply warm compresses to your perineum. This helps it stretch as the baby's head starts to crown, which can help prevent perineal tearing. ? In some cases, an incision may be made in your perineum (episiotomy) to allow the baby to pass through the vaginal opening. An episiotomy helps to make the opening of the vagina larger to allow more room for the baby to fit through.  It is very important to breathe and focus so your health care provider can control the delivery of your baby's head. Your health care provider may have you decrease the intensity of your pushing, to help prevent perineal tearing.  After delivery of your baby's head, the shoulders and the rest of the body generally deliver very quickly and without difficulty.  Once your baby is delivered, the umbilical cord may be cut right away, or this may be delayed for 1-2 minutes, depending on your baby's health. This may vary among health care providers, hospitals, and birth centers.  If you and your baby are healthy enough, your baby may be placed on your chest or abdomen to help maintain the baby's temperature and to help you bond with each other. Some mothers and babies start breastfeeding at this time. Your health care team will dry your baby and help keep your baby warm during this time.  Your baby may need immediate care if he or she: ? Showed signs of distress during labor. ? Has a medical condition. ? Was born too early (prematurely). ? Had a bowel movement before birth (meconium). ? Shows signs of difficulty transitioning from being inside the uterus to being outside of the uterus. If you are planning to breastfeed, your health care team will help you begin a feeding. Stage 3  The third stage of labor starts immediately after the birth of your baby and ends after you deliver the placenta. The placenta is an organ that develops  during pregnancy to provide oxygen and nutrients to your baby in the womb.  Delivering the placenta may require some pushing, and you may have mild contractions. Breastfeeding can stimulate contractions to help you deliver the placenta.  After the placenta is delivered, your uterus should tighten (contract) and become firm. This helps to stop bleeding in your uterus. To help your uterus contract and to control bleeding, your health care provider may: ? Give you medicine by injection, through an IV tube, by mouth, or through your rectum (rectally). ? Massage your abdomen or perform a vaginal exam to remove any blood clots that are left in your uterus. ? Empty your bladder by placing a thin, flexible tube (catheter) into your bladder. ? Encourage   you to breastfeed your baby. After labor is over, you and your baby will be monitored closely to ensure that you are both healthy until you are ready to go home. Your health care team will teach you how to care for yourself and your baby. This information is not intended to replace advice given to you by your health care provider. Make sure you discuss any questions you have with your health care provider. Document Released: 03/23/2008 Document Revised: 01/02/2016 Document Reviewed: 06/29/2015 Elsevier Interactive Patient Education  2018 [employer]  
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
## 2018-04-07 NOTE — Progress Notes (Signed)
ROB- no concerns 

## 2018-04-11 ENCOUNTER — Ambulatory Visit (INDEPENDENT_AMBULATORY_CARE_PROVIDER_SITE_OTHER): Payer: Managed Care, Other (non HMO) | Admitting: Maternal Newborn

## 2018-04-11 VITALS — BP 110/70 | Wt 214.5 lb

## 2018-04-11 DIAGNOSIS — Z3A38 38 weeks gestation of pregnancy: Secondary | ICD-10-CM

## 2018-04-11 DIAGNOSIS — Z3403 Encounter for supervision of normal first pregnancy, third trimester: Secondary | ICD-10-CM

## 2018-04-11 LAB — POCT URINALYSIS DIPSTICK OB: GLUCOSE, UA: NEGATIVE

## 2018-04-11 NOTE — Patient Instructions (Signed)
Vaginal Delivery Vaginal delivery means that you will give birth by pushing your baby out of your birth canal (vagina). A team of health care providers will help you before, during, and after vaginal delivery. Birth experiences are unique for every woman and every pregnancy, and birth experiences vary depending on where you choose to give birth. What should I do to prepare for my baby's birth? Before your baby is born, it is important to talk with your health care provider about:  Your labor and delivery preferences. These may include: ? Medicines that you may be given. ? How you will manage your pain. This might include non-medical pain relief techniques or injectable pain relief such as epidural analgesia. ? How you and your baby will be monitored during labor and delivery. ? Who may be in the labor and delivery room with you. ? Your feelings about surgical delivery of your baby (cesarean delivery, or C-section) if this becomes necessary. ? Your feelings about receiving donated blood through an IV tube (blood transfusion) if this becomes necessary.  Whether you are able: ? To take pictures or videos of the birth. ? To eat during labor and delivery. ? To move around, walk, or change positions during labor and delivery.  What to expect after your baby is born, such as: ? Whether delayed umbilical cord clamping and cutting is offered. ? Who will care for your baby right after birth. ? Medicines or tests that may be recommended for your baby. ? Whether breastfeeding is supported in your hospital or birth center. ? How long you will be in the hospital or birth center.  How any medical conditions you have may affect your baby or your labor and delivery experience.  To prepare for your baby's birth, you should also:  Attend all of your health care visits before delivery (prenatal visits) as recommended by your health care provider. This is important.  Prepare your home for your baby's  arrival. Make sure that you have: ? Diapers. ? Baby clothing. ? Feeding equipment. ? Safe sleeping arrangements for you and your baby.  Install a car seat in your vehicle. Have your car seat checked by a certified car seat installer to make sure that it is installed safely.  Think about who will help you with your new baby at home for at least the first several weeks after delivery.  What can I expect when I arrive at the birth center or hospital? Once you are in labor and have been admitted into the hospital or birth center, your health care provider may:  Review your pregnancy history and any concerns you have.  Insert an IV tube into one of your veins. This is used to give you fluids and medicines.  Check your blood pressure, pulse, temperature, and heart rate (vital signs).  Check whether your bag of water (amniotic sac) has broken (ruptured).  Talk with you about your birth plan and discuss pain control options.  Monitoring Your health care provider may monitor your contractions (uterine monitoring) and your baby's heart rate (fetal monitoring). You may need to be monitored:  Often, but not continuously (intermittently).  All the time or for long periods at a time (continuously). Continuous monitoring may be needed if: ? You are taking certain medicines, such as medicine to relieve pain or make your contractions stronger. ? You have pregnancy or labor complications.  Monitoring may be done by:  Placing a special stethoscope or a handheld monitoring device on your abdomen to   check your baby's heartbeat, and feeling your abdomen for contractions. This method of monitoring does not continuously record your baby's heartbeat or your contractions.  Placing monitors on your abdomen (external monitors) to record your baby's heartbeat and the frequency and length of contractions. You may not have to wear external monitors all the time.  Placing monitors inside of your uterus  (internal monitors) to record your baby's heartbeat and the frequency, length, and strength of your contractions. ? Your health care provider may use internal monitors if he or she needs more information about the strength of your contractions or your baby's heart rate. ? Internal monitors are put in place by passing a thin, flexible wire through your vagina and into your uterus. Depending on the type of monitor, it may remain in your uterus or on your baby's head until birth. ? Your health care provider will discuss the benefits and risks of internal monitoring with you and will ask for your permission before inserting the monitors.  Telemetry. This is a type of continuous monitoring that can be done with external or internal monitors. Instead of having to stay in bed, you are able to move around during telemetry. Ask your health care provider if telemetry is an option for you.  Physical exam Your health care provider may perform a physical exam. This may include:  Checking whether your baby is positioned: ? With the head toward your vagina (head-down). This is most common. ? With the head toward the top of your uterus (head-up or breech). If your baby is in a breech position, your health care provider may try to turn your baby to a head-down position so you can deliver vaginally. If it does not seem that your baby can be born vaginally, your provider may recommend surgery to deliver your baby. In rare cases, you may be able to deliver vaginally if your baby is head-up (breech delivery). ? Lying sideways (transverse). Babies that are lying sideways cannot be delivered vaginally.  Checking your cervix to determine: ? Whether it is thinning out (effacing). ? Whether it is opening up (dilating). ? How low your baby has moved into your birth canal.  What are the three stages of labor and delivery?  Normal labor and delivery is divided into the following three stages: Stage 1  Stage 1 is the  longest stage of labor, and it can last for hours or days. Stage 1 includes: ? Early labor. This is when contractions may be irregular, or regular and mild. Generally, early labor contractions are more than 10 minutes apart. ? Active labor. This is when contractions get longer, more regular, more frequent, and more intense. ? The transition phase. This is when contractions happen very close together, are very intense, and may last longer than during any other part of labor.  Contractions generally feel mild, infrequent, and irregular at first. They get stronger, more frequent (about every 2-3 minutes), and more regular as you progress from early labor through active labor and transition.  Many women progress through stage 1 naturally, but you may need help to continue making progress. If this happens, your health care provider may talk with you about: ? Rupturing your amniotic sac if it has not ruptured yet. ? Giving you medicine to help make your contractions stronger and more frequent.  Stage 1 ends when your cervix is completely dilated to 4 inches (10 cm) and completely effaced. This happens at the end of the transition phase. Stage 2  Once   your cervix is completely effaced and dilated to 4 inches (10 cm), you may start to feel an urge to push. It is common for the body to naturally take a rest before feeling the urge to push, especially if you received an epidural or certain other pain medicines. This rest period may last for up to 1-2 hours, depending on your unique labor experience.  During stage 2, contractions are generally less painful, because pushing helps relieve contraction pain. Instead of contraction pain, you may feel stretching and burning pain, especially when the widest part of your baby's head passes through the vaginal opening (crowning).  Your health care provider will closely monitor your pushing progress and your baby's progress through the vagina during stage 2.  Your  health care provider may massage the area of skin between your vaginal opening and anus (perineum) or apply warm compresses to your perineum. This helps it stretch as the baby's head starts to crown, which can help prevent perineal tearing. ? In some cases, an incision may be made in your perineum (episiotomy) to allow the baby to pass through the vaginal opening. An episiotomy helps to make the opening of the vagina larger to allow more room for the baby to fit through.  It is very important to breathe and focus so your health care provider can control the delivery of your baby's head. Your health care provider may have you decrease the intensity of your pushing, to help prevent perineal tearing.  After delivery of your baby's head, the shoulders and the rest of the body generally deliver very quickly and without difficulty.  Once your baby is delivered, the umbilical cord may be cut right away, or this may be delayed for 1-2 minutes, depending on your baby's health. This may vary among health care providers, hospitals, and birth centers.  If you and your baby are healthy enough, your baby may be placed on your chest or abdomen to help maintain the baby's temperature and to help you bond with each other. Some mothers and babies start breastfeeding at this time. Your health care team will dry your baby and help keep your baby warm during this time.  Your baby may need immediate care if he or she: ? Showed signs of distress during labor. ? Has a medical condition. ? Was born too early (prematurely). ? Had a bowel movement before birth (meconium). ? Shows signs of difficulty transitioning from being inside the uterus to being outside of the uterus. If you are planning to breastfeed, your health care team will help you begin a feeding. Stage 3  The third stage of labor starts immediately after the birth of your baby and ends after you deliver the placenta. The placenta is an organ that develops  during pregnancy to provide oxygen and nutrients to your baby in the womb.  Delivering the placenta may require some pushing, and you may have mild contractions. Breastfeeding can stimulate contractions to help you deliver the placenta.  After the placenta is delivered, your uterus should tighten (contract) and become firm. This helps to stop bleeding in your uterus. To help your uterus contract and to control bleeding, your health care provider may: ? Give you medicine by injection, through an IV tube, by mouth, or through your rectum (rectally). ? Massage your abdomen or perform a vaginal exam to remove any blood clots that are left in your uterus. ? Empty your bladder by placing a thin, flexible tube (catheter) into your bladder. ? Encourage   you to breastfeed your baby. After labor is over, you and your baby will be monitored closely to ensure that you are both healthy until you are ready to go home. Your health care team will teach you how to care for yourself and your baby. This information is not intended to replace advice given to you by your health care provider. Make sure you discuss any questions you have with your health care provider. Document Released: 03/23/2008 Document Revised: 01/02/2016 Document Reviewed: 06/29/2015 Elsevier Interactive Patient Education  2018 [employer]  

## 2018-04-11 NOTE — Progress Notes (Signed)
C/o feels different than she did yesterday;
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
## 2018-04-14 ENCOUNTER — Encounter: Payer: Managed Care, Other (non HMO) | Admitting: Certified Nurse Midwife

## 2018-04-18 ENCOUNTER — Ambulatory Visit (INDEPENDENT_AMBULATORY_CARE_PROVIDER_SITE_OTHER): Payer: Managed Care, Other (non HMO) | Admitting: Obstetrics & Gynecology

## 2018-04-18 ENCOUNTER — Encounter: Payer: Managed Care, Other (non HMO) | Admitting: Certified Nurse Midwife

## 2018-04-18 ENCOUNTER — Other Ambulatory Visit: Payer: Self-pay

## 2018-04-18 ENCOUNTER — Observation Stay
Admission: EM | Admit: 2018-04-18 | Discharge: 2018-04-19 | Disposition: A | Discharge status: Home / Self Care | Payer: Managed Care, Other (non HMO) | Attending: Obstetrics and Gynecology | Admitting: Obstetrics and Gynecology

## 2018-04-18 VITALS — BP 130/80 | Wt 217.0 lb

## 2018-04-18 DIAGNOSIS — N949 Unspecified condition associated with female genital organs and menstrual cycle: Secondary | ICD-10-CM

## 2018-04-18 DIAGNOSIS — M549 Dorsalgia, unspecified: Secondary | ICD-10-CM

## 2018-04-18 DIAGNOSIS — O471 False labor at or after 37 completed weeks of gestation: Principal | ICD-10-CM | POA: Insufficient documentation

## 2018-04-18 DIAGNOSIS — Z3403 Encounter for supervision of normal first pregnancy, third trimester: Secondary | ICD-10-CM

## 2018-04-18 DIAGNOSIS — O9989 Other specified diseases and conditions complicating pregnancy, childbirth and the puerperium: Secondary | ICD-10-CM

## 2018-04-18 DIAGNOSIS — Z3A39 39 weeks gestation of pregnancy: Secondary | ICD-10-CM

## 2018-04-18 DIAGNOSIS — O99891 Other specified diseases and conditions complicating pregnancy: Secondary | ICD-10-CM

## 2018-04-18 LAB — POCT URINALYSIS DIPSTICK OB: Glucose, UA: NEGATIVE

## 2018-04-18 NOTE — Addendum Note (Signed)
Addended by: Cornelius Moras D on: 04/18/2018 10:22 AM   Modules accepted: Orders
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
## 2018-04-18 NOTE — Progress Notes (Signed)
  Subjective  Fetal Movement? yes Contractions? yes Leaking Fluid? no Vaginal Bleeding? no  Objective  BP 130/80   Wt 217 lb (98.4 kg)   LMP 06/17/2017 (Exact Date)   BMI 35.02 kg/m  General: NAD Pumonary: no increased work of breathing Abdomen: gravid, non-tender Extremities: no edema Psychiatric: mood appropriate, affect full SVE- 0.5/30/-3, VTX Assessment  22 y.o. G1P0 at [redacted]w[redacted]d by  04/23/2018, by Ultrasound presenting for routine prenatal visit  Plan   Problem List Items Addressed This Visit      Other   Supervision of normal pregnancy in third trimester    Other Visit Diagnoses    [redacted] weeks gestation of pregnancy    -  Primary    Labor precautions discussed.  Annamarie Major, MD, [facility], [facility] 04/18/2018  10:17 AM

## 2018-04-18 NOTE — Patient Instructions (Signed)
Braxton Hicks Contractions °Contractions of the uterus can occur throughout pregnancy, but they are not always a sign that you are in labor. You may have practice contractions called Braxton Hicks contractions. These false labor contractions are sometimes confused with true labor. °What are Braxton Hicks contractions? °Braxton Hicks contractions are tightening movements that occur in the muscles of the uterus before labor. Unlike true labor contractions, these contractions do not result in opening (dilation) and thinning of the cervix. Toward the end of pregnancy (32-34 weeks), Braxton Hicks contractions can happen more often and may become stronger. These contractions are sometimes difficult to tell apart from true labor because they can be very uncomfortable. You should not feel embarrassed if you go to the hospital with false labor. °Sometimes, the only way to tell if you are in true labor is for your health care provider to look for changes in the cervix. The health care provider will do a physical exam and may monitor your contractions. If you are not in true labor, the exam should show that your cervix is not dilating and your water has not broken. °If there are other health problems associated with your pregnancy, it is completely safe for you to be sent home with false labor. You may continue to have Braxton Hicks contractions until you go into true labor. °How to tell the difference between true labor and false labor °True labor °· Contractions last 30-70 seconds. °· Contractions become very regular. °· Discomfort is usually felt in the top of the uterus, and it spreads to the lower abdomen and low back. °· Contractions do not go away with walking. °· Contractions usually become more intense and increase in frequency. °· The cervix dilates and gets thinner. °False labor °· Contractions are usually shorter and not as strong as true labor contractions. °· Contractions are usually irregular. °· Contractions  are often felt in the front of the lower abdomen and in the groin. °· Contractions may go away when you walk around or change positions while lying down. °· Contractions get weaker and are shorter-lasting as time goes on. °· The cervix usually does not dilate or become thin. °Follow these instructions at home: °· Take over-the-counter and prescription medicines only as told by your health care provider. °· Keep up with your usual exercises and follow other instructions from your health care provider. °· Eat and drink lightly if you think you are going into labor. °· If Braxton Hicks contractions are making you uncomfortable: °? Change your position from lying down or resting to walking, or change from walking to resting. °? Sit and rest in a tub of warm water. °? Drink enough fluid to keep your urine pale yellow. Dehydration may cause these contractions. °? Do slow and deep breathing several times an hour. °· Keep all follow-up prenatal visits as told by your health care provider. This is important. °Contact a health care provider if: °· You have a fever. °· You have continuous pain in your abdomen. °Get help right away if: °· Your contractions become stronger, more regular, and closer together. °· You have fluid leaking or gushing from your vagina. °· You pass blood-tinged mucus (bloody show). °· You have bleeding from your vagina. °· You have low back pain that you never had before. °· You feel your baby’s head pushing down and causing pelvic pressure. °· Your baby is not moving inside you as much as it used to. °Summary °· Contractions that occur before labor are called Braxton   Hicks contractions, false labor, or practice contractions. °· Braxton Hicks contractions are usually shorter, weaker, farther apart, and less regular than true labor contractions. True labor contractions usually become progressively stronger and regular and they become more frequent. °· Manage discomfort from Braxton Hicks contractions by  changing position, resting in a warm bath, drinking plenty of water, or practicing deep breathing. °This information is not intended to replace advice given to you by your health care provider. Make sure you discuss any questions you have with your health care provider. °Document Released: 10/28/2016 Document Revised: 10/28/2016 Document Reviewed: 10/28/2016 °Elsevier Interactive Patient Education © 2018 [employer] ° °

## 2018-04-19 DIAGNOSIS — Z3A39 39 weeks gestation of pregnancy: Secondary | ICD-10-CM | POA: Diagnosis not present

## 2018-04-19 DIAGNOSIS — O471 False labor at or after 37 completed weeks of gestation: Secondary | ICD-10-CM | POA: Diagnosis present

## 2018-04-19 NOTE — Discharge Instructions (Signed)
Braxton Hicks Contractions °Contractions of the uterus can occur throughout pregnancy, but they are not always a sign that you are in labor. You may have practice contractions called Braxton Hicks contractions. These false labor contractions are sometimes confused with true labor. °What are Braxton Hicks contractions? °Braxton Hicks contractions are tightening movements that occur in the muscles of the uterus before labor. Unlike true labor contractions, these contractions do not result in opening (dilation) and thinning of the cervix. Toward the end of pregnancy (32-34 weeks), Braxton Hicks contractions can happen more often and may become stronger. These contractions are sometimes difficult to tell apart from true labor because they can be very uncomfortable. You should not feel embarrassed if you go to the hospital with false labor. °Sometimes, the only way to tell if you are in true labor is for your health care provider to look for changes in the cervix. The health care provider will do a physical exam and may monitor your contractions. If you are not in true labor, the exam should show that your cervix is not dilating and your water has not broken. °If there are other health problems associated with your pregnancy, it is completely safe for you to be sent home with false labor. You may continue to have Braxton Hicks contractions until you go into true labor. °How to tell the difference between true labor and false labor °True labor °· Contractions last 30-70 seconds. °· Contractions become very regular. °· Discomfort is usually felt in the top of the uterus, and it spreads to the lower abdomen and low back. °· Contractions do not go away with walking. °· Contractions usually become more intense and increase in frequency. °· The cervix dilates and gets thinner. °False labor °· Contractions are usually shorter and not as strong as true labor contractions. °· Contractions are usually irregular. °· Contractions  are often felt in the front of the lower abdomen and in the groin. °· Contractions may go away when you walk around or change positions while lying down. °· Contractions get weaker and are shorter-lasting as time goes on. °· The cervix usually does not dilate or become thin. °Follow these instructions at home: °· Take over-the-counter and prescription medicines only as told by your health care provider. °· Keep up with your usual exercises and follow other instructions from your health care provider. °· Eat and drink lightly if you think you are going into labor. °· If Braxton Hicks contractions are making you uncomfortable: °? Change your position from lying down or resting to walking, or change from walking to resting. °? Sit and rest in a tub of warm water. °? Drink enough fluid to keep your urine pale yellow. Dehydration may cause these contractions. °? Do slow and deep breathing several times an hour. °· Keep all follow-up prenatal visits as told by your health care provider. This is important. °Contact a health care provider if: °· You have a fever. °· You have continuous pain in your abdomen. °Get help right away if: °· Your contractions become stronger, more regular, and closer together. °· You have fluid leaking or gushing from your vagina. °· You pass blood-tinged mucus (bloody show). °· You have bleeding from your vagina. °· You have low back pain that you never had before. °· You feel your baby’s head pushing down and causing pelvic pressure. °· Your baby is not moving inside you as much as it used to. °Summary °· Contractions that occur before labor are called Braxton   Hicks contractions, false labor, or practice contractions. °· Braxton Hicks contractions are usually shorter, weaker, farther apart, and less regular than true labor contractions. True labor contractions usually become progressively stronger and regular and they become more frequent. °· Manage discomfort from Braxton Hicks contractions by  changing position, resting in a warm bath, drinking plenty of water, or practicing deep breathing. °This information is not intended to replace advice given to you by your health care provider. Make sure you discuss any questions you have with your health care provider. °Document Released: 10/28/2016 Document Revised: 10/28/2016 Document Reviewed: 10/28/2016 °Elsevier Interactive Patient Education © 2018 [employer] ° °

## 2018-04-19 NOTE — Discharge Summary (Signed)
Physician Discharge Summary   Patient ID: Beth Willis [id number] 22 y.o. [birth year]  Admit date: 04/18/2018  Discharge date and time: No discharge date for patient encounter.   Admitting Physician: Natale Milch, MD   Discharge Physician: Adelene Idler MD  Admission Diagnoses: 39 weeks preg contractions  Discharge Diagnoses: Latent early labor  Admission Condition: good  Discharged Condition: good  Indication for Admission: Labor evaluation  Hospital Course: Patient was admitted for labor evaluation. She had 2 hours of regular contraction every 3-5 minutes at home starting at 7:30PM. She denies leakage of fluid or vaginal bleeding. She is feeling well. Was last seen today in office. Denies headaches. Denies vision changes. Denies RUQ pain. She would like to be induced for social reasons since her mother lives in [place]. She was scheduled for IOL on 04/25/18. She will monitor her BP at home with a machine that belongs to her father.   Consults: None  Significant Diagnostic Studies: SVE  Treatments: None  Discharge Exam: BP 135/81 (BP Location: Left Arm)   Pulse 87   Temp 98.2 F (36.8 C) (Oral)   Resp (!) 116   Ht 5\' 6"  (1.676 m)   Wt 98.4 kg   LMP 06/17/2017 (Exact Date)   BMI 35.02 kg/m   General Appearance:    Alert, cooperative, no distress, appears stated age  Head:    Normocephalic, without obvious abnormality, atraumatic  Eyes:    PERRL, conjunctiva/corneas clear, EOM's intact, fundi    benign, both eyes  Ears:    Normal TM's and external ear canals, both ears  Nose:   Nares normal, septum midline, mucosa normal, no drainage    or sinus tenderness  Throat:   Lips, mucosa, and tongue normal; teeth and gums normal  Neck:   Supple, symmetrical, trachea midline, no adenopathy;    thyroid:  no enlargement/tenderness/nodules; no carotid   bruit or JVD  Back:     Symmetric, no curvature, ROM normal, no CVA tenderness  Lungs:     Clear to  auscultation bilaterally, respirations unlabored  Chest Wall:    No tenderness or deformity   Heart:    Regular rate and rhythm, S1 and S2 normal, no murmur, rub   or gallop  Breast Exam:    No tenderness, masses, or nipple abnormality  Abdomen:     Soft, non-tender, bowel sounds active all four quadrants,    no masses, no organomegaly  Genitalia:    Normal female without lesion, discharge or tenderness  Rectal:    Normal tone, normal prostate, no masses or tenderness;   guaiac negative stool  Extremities:   Extremities normal, atraumatic, no cyanosis or edema  Pulses:   2+ and symmetric all extremities  Skin:   Skin color, texture, turgor normal, no rashes or lesions  Lymph nodes:   Cervical, supraclavicular, and axillary nodes normal  Neurologic:   CNII-XII intact, normal strength, sensation and reflexes    throughout    Disposition: Discharge disposition: 01-Home or Self Care       Patient Instructions:   Activity: activity as tolerated Diet: regular diet Wound Care: none needed  Follow-up with OBGYN office  in 1 week.  Signed: Natale Milch 04/19/2018 1:31 AM

## 2018-04-21 ENCOUNTER — Encounter: Payer: Self-pay | Admitting: *Deleted

## 2018-04-21 ENCOUNTER — Ambulatory Visit (INDEPENDENT_AMBULATORY_CARE_PROVIDER_SITE_OTHER): Payer: Managed Care, Other (non HMO) | Admitting: Certified Nurse Midwife

## 2018-04-21 ENCOUNTER — Other Ambulatory Visit: Payer: Self-pay

## 2018-04-21 ENCOUNTER — Observation Stay
Admission: EM | Admit: 2018-04-21 | Discharge: 2018-04-21 | Disposition: A | Discharge status: Home / Self Care | Payer: Managed Care, Other (non HMO) | Source: Home / Self Care | Admitting: Obstetrics and Gynecology

## 2018-04-21 VITALS — BP 126/60 | Wt 215.0 lb

## 2018-04-21 DIAGNOSIS — O163 Unspecified maternal hypertension, third trimester: Secondary | ICD-10-CM

## 2018-04-21 DIAGNOSIS — R030 Elevated blood-pressure reading, without diagnosis of hypertension: Secondary | ICD-10-CM

## 2018-04-21 DIAGNOSIS — M549 Dorsalgia, unspecified: Secondary | ICD-10-CM

## 2018-04-21 DIAGNOSIS — Z3A39 39 weeks gestation of pregnancy: Secondary | ICD-10-CM

## 2018-04-21 DIAGNOSIS — N949 Unspecified condition associated with female genital organs and menstrual cycle: Secondary | ICD-10-CM

## 2018-04-21 DIAGNOSIS — R51 Headache: Secondary | ICD-10-CM

## 2018-04-21 DIAGNOSIS — O9989 Other specified diseases and conditions complicating pregnancy, childbirth and the puerperium: Secondary | ICD-10-CM

## 2018-04-21 DIAGNOSIS — Z3403 Encounter for supervision of normal first pregnancy, third trimester: Secondary | ICD-10-CM

## 2018-04-21 DIAGNOSIS — O26893 Other specified pregnancy related conditions, third trimester: Secondary | ICD-10-CM

## 2018-04-21 HISTORY — DX: Unspecified asthma, uncomplicated: J45.909

## 2018-04-21 HISTORY — DX: Anemia, unspecified: D64.9

## 2018-04-21 LAB — CBC
HEMATOCRIT: 35.7 % — AB (ref 36.0–46.0)
HEMOGLOBIN: 11.9 g/dL — AB (ref 12.0–15.0)
MCH: 30.5 pg (ref 26.0–34.0)
MCHC: 33.3 g/dL (ref 30.0–36.0)
MCV: 91.5 fL (ref 80.0–100.0)
NRBC: 0 % (ref 0.0–0.2)
Platelets: 141 10*3/uL — ABNORMAL LOW (ref 150–400)
RBC: 3.9 MIL/uL (ref 3.87–5.11)
RDW: 13.6 % (ref 11.5–15.5)
WBC: 11.7 10*3/uL — ABNORMAL HIGH (ref 4.0–10.5)

## 2018-04-21 LAB — POCT URINALYSIS DIPSTICK OB: Glucose, UA: NEGATIVE

## 2018-04-21 LAB — COMPREHENSIVE METABOLIC PANEL
ALBUMIN: 3.1 g/dL — AB (ref 3.5–5.0)
ALK PHOS: 136 U/L — AB (ref 38–126)
ALT: 11 U/L (ref 0–44)
AST: 17 U/L (ref 15–41)
Anion gap: 9 (ref 5–15)
BILIRUBIN TOTAL: 0.4 mg/dL (ref 0.3–1.2)
BUN: 9 mg/dL (ref 6–20)
CALCIUM: 8.9 mg/dL (ref 8.9–10.3)
CO2: 20 mmol/L — AB (ref 22–32)
Chloride: 108 mmol/L (ref 98–111)
Creatinine, Ser: 0.59 mg/dL (ref 0.44–1.00)
GFR calc Af Amer: >60 mL/min (ref >60)
GFR calc non Af Amer: >60 mL/min (ref >60)
GLUCOSE: 109 mg/dL — AB (ref 70–99)
POTASSIUM: 3.9 mmol/L (ref 3.5–5.1)
SODIUM: 137 mmol/L (ref 135–145)
Total Protein: 6.2 g/dL — ABNORMAL LOW (ref 6.5–8.1)

## 2018-04-21 LAB — PROTEIN / CREATININE RATIO, URINE
Creatinine, Urine: 263 mg/dL
Protein Creatinine Ratio: 0.13 mg/mg{Cre} (ref 0.00–0.15)
Total Protein, Urine: 33 mg/dL

## 2018-04-21 NOTE — Progress Notes (Signed)
C/o high BP - home readings: 142/100, 144/97, 141/93, 133/99, 137/103, 146/96, 140/96,137/96;  At [facility] 136/86.

## 2018-04-21 NOTE — Progress Notes (Signed)
Work in appointment at 39wk5d: complains of elevated blood pressures at home, last night and this Am (see note below). Has been having frontal headaches (has not taken Tylenol) and feels lightheaded and sees silver spots when she bends over. Frequent mild contractions x 3 days, sometimes every 3-5 minutes,  that she feels in the mid upper abdomen and in her back. + Nausea +heartburn. No vomiting. Fetal movement present but somewhat decreased Exam: Trace proteinuria. BP 126/60 General: appears tired FH 41 cm and FHTs WNL Cervix: 0.5/ 50%/-2/ vertex A: Elevated blood pressures and frontal headaches P: To L&D for preeclampsia labs, serial blood pressures, NST Farrel Conners, CNM

## 2018-04-21 NOTE — OB Triage Note (Signed)
Sent over from office. CO HA for the past 2 days coming and going. Rates pain 7 out of 10, frontal. Denies taking anything for the HA. Denies visual disturbances. +1 bilateral LE reflexes. Mild, non-pitting edema to bilateral LE. Beth Willis

## 2018-04-21 NOTE — Discharge Summary (Signed)
Physician Discharge Summary   Patient ID: Beth Willis [id number] 22 y.o. 21-May-1996  Admit date: 04/21/2018  Discharge date and time: 04/21/2018  6:00 PM   Admitting Physician: Natale Milch, MD   Discharge Physician: Natale Milch MD  Admission Diagnoses: Sent by Doctor  Discharge Diagnoses:  [redacted] weeks pregnant  Admission Condition: good  Discharged Condition: good  Indication for Admission: Evaluation for preeclampsia  Hospital Course: Patient was sent from the office for evaluation of preeclampsia. She reported elevated blood pressures at home.  She has had normal blood pressure today in the office and hospital. Her labs were negative for preeclampsia. She has an induction scheduled for Tuesday morning. She is amenable to being discharged home today and returning for her planned induction of labor next week. We discussed the warning signs of preeclampsia and she will return if she has any symptoms.   Consults: None  Significant Diagnostic Studies: labs: please see results review  Treatments: none  Discharge Exam: BP 122/80   Pulse 82   Ht 5\' 7"  (1.702 m)   Wt 98.4 kg   LMP 06/17/2017 (Exact Date)   BMI 33.99 kg/m   General Appearance:    Alert, cooperative, no distress, appears stated age  Head:    Normocephalic, without obvious abnormality, atraumatic  Eyes:    PERRL, conjunctiva/corneas clear, EOM's intact, fundi    benign, both eyes  Ears:    Normal TM's and external ear canals, both ears  Nose:   Nares normal, septum midline, mucosa normal, no drainage    or sinus tenderness  Throat:   Lips, mucosa, and tongue normal; teeth and gums normal  Neck:   Supple, symmetrical, trachea midline, no adenopathy;    thyroid:  no enlargement/tenderness/nodules; no carotid   bruit or JVD  Back:     Symmetric, no curvature, ROM normal, no CVA tenderness  Lungs:     Clear to auscultation bilaterally, respirations unlabored  Chest Wall:    No tenderness or  deformity   Heart:    Regular rate and rhythm, S1 and S2 normal, no murmur, rub   or gallop  Breast Exam:    No tenderness, masses, or nipple abnormality  Abdomen:     Soft, non-tender, bowel sounds active all four quadrants,    no masses, no organomegaly  Genitalia:    Normal female without lesion, discharge or tenderness  Rectal:    Normal tone, normal prostate, no masses or tenderness;   guaiac negative stool  Extremities:   Extremities normal, atraumatic, no cyanosis or edema  Pulses:   2+ and symmetric all extremities  Skin:   Skin color, texture, turgor normal, no rashes or lesions  Lymph nodes:   Cervical, supraclavicular, and axillary nodes normal  Neurologic:   CNII-XII intact, normal strength, sensation and reflexes    throughout    Disposition: Discharge disposition: 01-Home or Self Care       Patient Instructions:  Allergies as of 04/21/2018      Reactions   Amoxicillin    Penicillins    Pollen Extract       Medication List    TAKE these medications   ferrous sulfate 325 (65 FE) MG tablet Take 1 tablet (325 mg total) by mouth daily with breakfast.   loratadine 10 MG tablet Commonly known as:  CLARITIN Take 10 mg by mouth daily.   montelukast 10 MG tablet Commonly known as:  SINGULAIR TAKE 1 TABLET BY MOUTH EVERYDAY AT BEDTIME  PRENATE MINI 18-0.6-0.4-350 MG Caps Take 1 capsule by mouth daily.      Activity: activity as tolerated Diet: regular diet Wound Care: none needed  Follow-up with [facility] in 3 days.  Signed: Natale Milch 04/21/2018 7:07 PM

## 2018-04-21 NOTE — Discharge Summary (Signed)
RN went over discharge instructions with patient. Gave patient opportunity to ask questions. All questions answered at this time. Patient verbalized understanding. Patient discharged home.

## 2018-04-22 ENCOUNTER — Inpatient Hospital Stay
Admission: EM | Admit: 2018-04-22 | Discharge: 2018-04-22 | Disposition: A | Discharge status: Home / Self Care | Payer: Managed Care, Other (non HMO) | Source: Home / Self Care | Admitting: Obstetrics and Gynecology

## 2018-04-22 ENCOUNTER — Other Ambulatory Visit: Payer: Self-pay

## 2018-04-22 DIAGNOSIS — O471 False labor at or after 37 completed weeks of gestation: Secondary | ICD-10-CM

## 2018-04-22 DIAGNOSIS — O9989 Other specified diseases and conditions complicating pregnancy, childbirth and the puerperium: Secondary | ICD-10-CM

## 2018-04-22 DIAGNOSIS — Z3A39 39 weeks gestation of pregnancy: Secondary | ICD-10-CM

## 2018-04-22 DIAGNOSIS — O99891 Other specified diseases and conditions complicating pregnancy: Secondary | ICD-10-CM

## 2018-04-22 DIAGNOSIS — M549 Dorsalgia, unspecified: Secondary | ICD-10-CM

## 2018-04-22 DIAGNOSIS — N949 Unspecified condition associated with female genital organs and menstrual cycle: Secondary | ICD-10-CM

## 2018-04-22 DIAGNOSIS — Z880 Allergy status to penicillin: Secondary | ICD-10-CM

## 2018-04-22 LAB — ROM PLUS (ARMC ONLY): ROM PLUS: NEGATIVE

## 2018-04-22 NOTE — OB Triage Note (Signed)
Pt presents from ED with reports of her water breaking at 0245 and contractions every 1-3 minutes. Endorses intercourse in the last 24 hours. Denies bleeding, N/V/D. Endorses positive fetal movement.

## 2018-04-22 NOTE — Discharge Summary (Signed)
Physician Discharge Summary   Patient ID: Beth Willis [id number] 22 y.o. October 19, 1995  Admit date: 04/22/2018  Discharge date and time: No discharge date for patient encounter.   Admitting Physician: Natale Milch, MD   Discharge Physician: Adelene Idler MD  Admission Diagnoses: 39 wks preg water broke contractions  Discharge Diagnoses: [redacted] weeks gestation, latent early labor, intact membranes  Admission Condition: good  Discharged Condition: good  Indication for Admission: Triage evaluation  Hospital Course: She was admitted for evaluation of membrane status. ROM + was negative. No gushing or pooling of fluid on examination. Irregular contractions. SVE 3/70/-3, no change over several hours. Will discharge home. She will return when contractions are more regular.   NST: 130 bpm baseline, moderate variability, 15x15 accelerations, none decelerations. Tocometer :irregular   Consults: None  Significant Diagnostic Studies: labs: ROM + negative  Treatments: none  Discharge Exam: BP 132/82   Pulse 81   Temp 97.6 F (36.4 C) (Oral)   Resp 16   LMP 06/17/2017 (Exact Date)   General Appearance:    Alert, cooperative, no distress, appears stated age  Head:    Normocephalic, without obvious abnormality, atraumatic  Eyes:    PERRL, conjunctiva/corneas clear, EOM's intact, fundi    benign, both eyes  Ears:    Normal TM's and external ear canals, both ears  Nose:   Nares normal, septum midline, mucosa normal, no drainage    or sinus tenderness  Throat:   Lips, mucosa, and tongue normal; teeth and gums normal  Neck:   Supple, symmetrical, trachea midline, no adenopathy;    thyroid:  no enlargement/tenderness/nodules; no carotid   bruit or JVD  Back:     Symmetric, no curvature, ROM normal, no CVA tenderness  Lungs:     Clear to auscultation bilaterally, respirations unlabored  Chest Wall:    No tenderness or deformity   Heart:    Regular rate and rhythm, S1 and S2  normal, no murmur, rub   or gallop  Breast Exam:    No tenderness, masses, or nipple abnormality  Abdomen:     Soft, non-tender, bowel sounds active all four quadrants,    no masses, no organomegaly  Genitalia:    Normal female without lesion, discharge or tenderness  Rectal:    Normal tone, normal prostate, no masses or tenderness;   guaiac negative stool  Extremities:   Extremities normal, atraumatic, no cyanosis or edema  Pulses:   2+ and symmetric all extremities  Skin:   Skin color, texture, turgor normal, no rashes or lesions  Lymph nodes:   Cervical, supraclavicular, and axillary nodes normal  Neurologic:   CNII-XII intact, normal strength, sensation and reflexes    throughout    Disposition: Discharge disposition: 01-Home or Self Care       Patient Instructions:  Allergies as of 04/22/2018      Reactions   Amoxicillin    Penicillins    Pollen Extract       Medication List    TAKE these medications   ferrous sulfate 325 (65 FE) MG tablet Take 1 tablet (325 mg total) by mouth daily with breakfast.   loratadine 10 MG tablet Commonly known as:  CLARITIN Take 10 mg by mouth daily.   montelukast 10 MG tablet Commonly known as:  SINGULAIR TAKE 1 TABLET BY MOUTH EVERYDAY AT BEDTIME   PRENATE MINI 18-0.6-0.4-350 MG Caps Take 1 capsule by mouth daily.      Activity: activity as tolerated Diet: regular  diet Wound Care: none needed  Follow-up with [facility] in 2 days.  Signed: Natale Milch 04/22/2018 8:29 AM

## 2018-04-23 ENCOUNTER — Other Ambulatory Visit: Payer: Self-pay

## 2018-04-23 ENCOUNTER — Inpatient Hospital Stay
Admission: EM | Admit: 2018-04-23 | Discharge: 2018-04-25 | DRG: 807 | Disposition: A | Discharge status: Home / Self Care | Payer: Managed Care, Other (non HMO) | Attending: Obstetrics and Gynecology | Admitting: Obstetrics and Gynecology

## 2018-04-23 ENCOUNTER — Inpatient Hospital Stay: Payer: Managed Care, Other (non HMO) | Admitting: Anesthesiology

## 2018-04-23 DIAGNOSIS — F1290 Cannabis use, unspecified, uncomplicated: Secondary | ICD-10-CM

## 2018-04-23 DIAGNOSIS — Z3483 Encounter for supervision of other normal pregnancy, third trimester: Secondary | ICD-10-CM | POA: Diagnosis present

## 2018-04-23 DIAGNOSIS — Z3A40 40 weeks gestation of pregnancy: Secondary | ICD-10-CM

## 2018-04-23 DIAGNOSIS — Z87891 Personal history of nicotine dependence: Secondary | ICD-10-CM | POA: Diagnosis not present

## 2018-04-23 DIAGNOSIS — O99324 Drug use complicating childbirth: Secondary | ICD-10-CM

## 2018-04-23 DIAGNOSIS — Z880 Allergy status to penicillin: Secondary | ICD-10-CM

## 2018-04-23 LAB — CBC
HCT: 37.8 % (ref 36.0–46.0)
Hemoglobin: 12.9 g/dL (ref 12.0–15.0)
MCH: 30.9 pg (ref 26.0–34.0)
MCHC: 34.1 g/dL (ref 30.0–36.0)
MCV: 90.6 fL (ref 80.0–100.0)
NRBC: 0 % (ref 0.0–0.2)
PLATELETS: 158 10*3/uL (ref 150–400)
RBC: 4.17 MIL/uL (ref 3.87–5.11)
RDW: 13.5 % (ref 11.5–15.5)
WBC: 16.9 10*3/uL — AB (ref 4.0–10.5)

## 2018-04-23 LAB — TYPE AND SCREEN
ABO/RH(D): A POS
ANTIBODY SCREEN: NEGATIVE

## 2018-04-23 LAB — URINE DRUG SCREEN, QUALITATIVE (ARMC ONLY)
Amphetamines, Ur Screen: NOT DETECTED
BARBITURATES, UR SCREEN: NOT DETECTED
BENZODIAZEPINE, UR SCRN: NOT DETECTED
CANNABINOID 50 NG, UR ~~LOC~~: NOT DETECTED
COCAINE METABOLITE, UR ~~LOC~~: NOT DETECTED
MDMA (Ecstasy)Ur Screen: NOT DETECTED
Methadone Scn, Ur: NOT DETECTED
OPIATE, UR SCREEN: NOT DETECTED
Phencyclidine (PCP) Ur S: NOT DETECTED
TRICYCLIC, UR SCREEN: NOT DETECTED

## 2018-04-23 MED ORDER — LIDOCAINE HCL (PF) 1 % IJ SOLN: 30.0000 mL | INTRAMUSCULAR | Status: DC | PRN

## 2018-04-23 MED ORDER — PRENATAL MULTIVITAMIN CH
1.0000 | ORAL_TABLET | Freq: Every day | ORAL | Status: DC
  Administered 2018-04-24 – 2018-04-25 (×2): 1 via ORAL
  Filled 2018-04-23 (×2): qty 1

## 2018-04-23 MED ORDER — ONDANSETRON HCL 4 MG/2ML IJ SOLN: 4.0000 mg | INTRAMUSCULAR | Status: DC | PRN

## 2018-04-23 MED ORDER — LACTATED RINGERS IV SOLN
500.0000 mL | INTRAVENOUS | Status: DC | PRN
  Administered 2018-04-23: 500 mL via INTRAVENOUS
  Administered 2018-04-23: 250 mL via INTRAVENOUS

## 2018-04-23 MED ORDER — SENNOSIDES-DOCUSATE SODIUM 8.6-50 MG PO TABS
2.0000 | ORAL_TABLET | ORAL | Status: DC
  Administered 2018-04-23 – 2018-04-25 (×2): 2 via ORAL
  Filled 2018-04-23 (×3): qty 2

## 2018-04-23 MED ORDER — OXYCODONE-ACETAMINOPHEN 5-325 MG PO TABS: 2.0000 | ORAL_TABLET | ORAL | Status: DC | PRN

## 2018-04-23 MED ORDER — OXYCODONE-ACETAMINOPHEN 5-325 MG PO TABS
1.0000 | ORAL_TABLET | ORAL | Status: DC | PRN
  Administered 2018-04-23 – 2018-04-25 (×6): 1 via ORAL
  Filled 2018-04-23 (×6): qty 1

## 2018-04-23 MED ORDER — AMMONIA AROMATIC IN INHA
RESPIRATORY_TRACT | Status: AC
  Filled 2018-04-23: qty 10

## 2018-04-23 MED ORDER — ACETAMINOPHEN 325 MG PO TABS: 650.0000 mg | ORAL_TABLET | ORAL | Status: DC | PRN

## 2018-04-23 MED ORDER — FENTANYL 2.5 MCG/ML W/ROPIVACAINE 0.15% IN NS 100 ML EPIDURAL (ARMC)
12.0000 mL/h | EPIDURAL | Status: DC
  Administered 2018-04-23 (×2): 12 mL/h via EPIDURAL
  Filled 2018-04-23: qty 100

## 2018-04-23 MED ORDER — DIBUCAINE 1 % RE OINT: 1.0000 "application " | TOPICAL_OINTMENT | RECTAL | Status: DC | PRN

## 2018-04-23 MED ORDER — IBUPROFEN 600 MG PO TABS
600.0000 mg | ORAL_TABLET | Freq: Four times a day (QID) | ORAL | Status: DC
  Administered 2018-04-23 – 2018-04-25 (×8): 600 mg via ORAL
  Filled 2018-04-23 (×10): qty 1

## 2018-04-23 MED ORDER — OXYTOCIN BOLUS FROM INFUSION
500.0000 mL | Freq: Once | INTRAVENOUS | Status: DC
  Administered 2018-04-23: 500 mL via INTRAVENOUS

## 2018-04-23 MED ORDER — ONDANSETRON HCL 4 MG PO TABS: 4.0000 mg | ORAL_TABLET | ORAL | Status: DC | PRN

## 2018-04-23 MED ORDER — TETANUS-DIPHTH-ACELL PERTUSSIS 5-2.5-18.5 LF-MCG/0.5 IM SUSP
0.5000 mL | Freq: Once | INTRAMUSCULAR | Status: DC
  Filled 2018-04-23: qty 0.5

## 2018-04-23 MED ORDER — SODIUM CHLORIDE 0.9 % IV SOLN
INTRAVENOUS | Status: DC | PRN
  Administered 2018-04-23 (×2): 5 mL via EPIDURAL

## 2018-04-23 MED ORDER — DIPHENHYDRAMINE HCL 50 MG/ML IJ SOLN: 12.5000 mg | INTRAMUSCULAR | Status: DC | PRN

## 2018-04-23 MED ORDER — OXYTOCIN 40 UNITS IN LACTATED RINGERS INFUSION - SIMPLE MED
INTRAVENOUS | Status: AC
  Filled 2018-04-23: qty 1000

## 2018-04-23 MED ORDER — MISOPROSTOL 200 MCG PO TABS
ORAL_TABLET | ORAL | Status: AC
  Filled 2018-04-23: qty 4

## 2018-04-23 MED ORDER — BENZOCAINE-MENTHOL 20-0.5 % EX AERO
1.0000 "application " | INHALATION_SPRAY | CUTANEOUS | Status: DC | PRN
  Administered 2018-04-23: 1 via TOPICAL
  Filled 2018-04-23: qty 56

## 2018-04-23 MED ORDER — FENTANYL 2.5 MCG/ML W/ROPIVACAINE 0.15% IN NS 100 ML EPIDURAL (ARMC)
EPIDURAL | Status: AC
  Filled 2018-04-23: qty 100

## 2018-04-23 MED ORDER — LIDOCAINE HCL (PF) 1 % IJ SOLN
INTRAMUSCULAR | Status: AC
  Filled 2018-04-23: qty 30

## 2018-04-23 MED ORDER — LACTATED RINGERS IV SOLN
INTRAVENOUS | Status: DC
  Administered 2018-04-23: 11:00:00 via INTRAVENOUS

## 2018-04-23 MED ORDER — DIPHENHYDRAMINE HCL 25 MG PO CAPS: 25.0000 mg | ORAL_CAPSULE | Freq: Four times a day (QID) | ORAL | Status: DC | PRN

## 2018-04-23 MED ORDER — SOD CITRATE-CITRIC ACID 500-334 MG/5ML PO SOLN: 30.0000 mL | ORAL | Status: DC | PRN

## 2018-04-23 MED ORDER — COCONUT OIL OIL
1.0000 "application " | TOPICAL_OIL | Status: DC | PRN
  Administered 2018-04-23: 1 via TOPICAL
  Filled 2018-04-23: qty 120

## 2018-04-23 MED ORDER — LIDOCAINE-EPINEPHRINE (PF) 1.5 %-1:200000 IJ SOLN
INTRAMUSCULAR | Status: DC | PRN
  Administered 2018-04-23: 3 mL via PERINEURAL

## 2018-04-23 MED ORDER — EPHEDRINE 5 MG/ML INJ: 10.0000 mg | INTRAVENOUS | Status: DC | PRN

## 2018-04-23 MED ORDER — LIDOCAINE HCL (PF) 1 % IJ SOLN
INTRAMUSCULAR | Status: DC | PRN
  Administered 2018-04-23: 3 mL

## 2018-04-23 MED ORDER — TERBUTALINE SULFATE 1 MG/ML IJ SOLN: 0.2500 mg | Freq: Once | INTRAMUSCULAR | Status: DC | PRN

## 2018-04-23 MED ORDER — SIMETHICONE 80 MG PO CHEW: 80.0000 mg | CHEWABLE_TABLET | ORAL | Status: DC | PRN

## 2018-04-23 MED ORDER — OXYTOCIN 40 UNITS IN LACTATED RINGERS INFUSION - SIMPLE MED
2.5000 [IU]/h | INTRAVENOUS | Status: DC
  Filled 2018-04-23: qty 1000

## 2018-04-23 MED ORDER — PHENYLEPHRINE 40 MCG/ML (10ML) SYRINGE FOR IV PUSH (FOR BLOOD PRESSURE SUPPORT): 80.0000 ug | PREFILLED_SYRINGE | INTRAVENOUS | Status: DC | PRN

## 2018-04-23 MED ORDER — ACETAMINOPHEN 325 MG PO TABS
650.0000 mg | ORAL_TABLET | ORAL | Status: DC | PRN
  Administered 2018-04-24 – 2018-04-25 (×2): 650 mg via ORAL
  Filled 2018-04-23 (×2): qty 2

## 2018-04-23 MED ORDER — WITCH HAZEL-GLYCERIN EX PADS: 1.0000 "application " | MEDICATED_PAD | CUTANEOUS | Status: DC | PRN

## 2018-04-23 MED ORDER — BUTORPHANOL TARTRATE 2 MG/ML IJ SOLN
1.0000 mg | INTRAMUSCULAR | Status: DC | PRN
  Administered 2018-04-23: 1 mg via INTRAVENOUS
  Filled 2018-04-23: qty 1

## 2018-04-23 MED ORDER — OXYTOCIN 40 UNITS IN LACTATED RINGERS INFUSION - SIMPLE MED
1.0000 m[IU]/min | INTRAVENOUS | Status: DC
  Administered 2018-04-23: 2 m[IU]/min via INTRAVENOUS

## 2018-04-23 MED ORDER — OXYTOCIN 10 UNIT/ML IJ SOLN
INTRAMUSCULAR | Status: AC
  Filled 2018-04-23: qty 2

## 2018-04-23 MED ORDER — ONDANSETRON HCL 4 MG/2ML IJ SOLN: 4.0000 mg | Freq: Four times a day (QID) | INTRAMUSCULAR | Status: DC | PRN

## 2018-04-23 MED ORDER — LACTATED RINGERS IV SOLN: 500.0000 mL | Freq: Once | INTRAVENOUS | Status: DC

## 2018-04-23 NOTE — OB Triage Note (Signed)
Pt presents from ED with reports of contractions every 1-3 minutes and loss of mucous plug. Denies intercourse in the last 24 hours, bleeding and N/V/D. Endorses positive fetal movement.

## 2018-04-23 NOTE — Plan of Care (Signed)
Pt. Transferred to room 349. Alert and oriented with pleasant affect. Color good. Skin w&d. BBS clear. Fundus is midline and firm at U/E. Small amount of Lochia. Instructed in Database administrator and Security as Safe Sleep and Patients Moderate Risk for Falls. Pt. V/O.

## 2018-04-23 NOTE — Anesthesia Preprocedure Evaluation (Signed)
Anesthesia Evaluation  Patient identified by MRN, date of birth, ID band Patient awake    Reviewed: Allergy & Precautions, H&P , NPO status , Patient's Chart, lab work & pertinent test results, reviewed documented beta blocker date and time   History of Anesthesia Complications Negative for: history of anesthetic complications  Airway Mallampati: III  TM Distance: >3 FB Neck ROM: full    Dental  (+) Teeth Intact   Pulmonary neg shortness of breath, asthma , neg sleep apnea, neg recent URI, former smoker,           Cardiovascular Exercise Tolerance: Good negative cardio ROS       Neuro/Psych  Headaches, neg Seizures PSYCHIATRIC DISORDERS    GI/Hepatic Neg liver ROS, GERD  ,  Endo/Other  negative endocrine ROS  Renal/GU negative Renal ROS  negative genitourinary   Musculoskeletal   Abdominal   Peds  Hematology  (+) Blood dyscrasia, anemia ,   Anesthesia Other Findings Past Medical History: No date: ADD (attention deficit disorder) No date: Anemia No date: Asthma No date: Headache No date: History of substance abuse (HCC)   Reproductive/Obstetrics (+) Pregnancy                             Anesthesia Physical Anesthesia Plan  ASA: II  Anesthesia Plan: Epidural   Post-op Pain Management:    Induction:   PONV Risk Score and Plan:   Airway Management Planned:   Additional Equipment:   Intra-op Plan:   Post-operative Plan:   Informed Consent: I have reviewed the patients History and Physical, chart, labs and discussed the procedure including the risks, benefits and alternatives for the proposed anesthesia with the patient or authorized representative who has indicated his/her understanding and acceptance.     Plan Discussed with: Anesthesiologist, CRNA and Surgeon  Anesthesia Plan Comments:         Anesthesia Quick Evaluation

## 2018-04-23 NOTE — Discharge Summary (Signed)
Obstetric Discharge Summary Reason for Admission: onset of labor Prenatal Procedures: none Intrapartum Procedures: forceps (low) Postpartum Procedures: none Complications-Operative and Postpartum: none Hemoglobin  Date Value Ref Range Status  04/24/2018 10.4 (L) 12.0 - 15.0 g/dL Final  86/57/8469 62.9 (L) 11.1 - 15.9 g/dL Final  52/84/1324 40.1  Final   HCT  Date Value Ref Range Status  04/24/2018 31.2 (L) 36.0 - 46.0 % Final  09/07/2017 37  Final   Hematocrit  Date Value Ref Range Status  01/27/2018 32.8 (L) 34.0 - 46.6 % Final    Physical Exam:  General: alert and cooperative Lochia: appropriate Uterine Fundus: firm Incision: N/A DVT Evaluation: No evidence of DVT seen on physical exam.  Discharge Diagnoses: Term Pregnancy-delivered  Discharge Information: Date: 04/25/2018 Activity: pelvic rest Diet: routine Allergies as of 04/25/2018      Reactions   Amoxicillin    Penicillins    Pollen Extract       Medication List    STOP taking these medications   ferrous sulfate 325 (65 FE) MG tablet     TAKE these medications   loratadine 10 MG tablet Commonly known as:  CLARITIN Take 10 mg by mouth daily.   montelukast 10 MG tablet Commonly known as:  SINGULAIR TAKE 1 TABLET BY MOUTH EVERYDAY AT BEDTIME   PRENATE MINI 18-0.6-0.4-350 MG Caps Take 1 capsule by mouth daily.       Condition: stable Instructions: refer to practice specific booklet Discharge to: home Follow-up Information    Vena Austria, MD Follow up in 6 week(s).   Specialty:  Obstetrics and Gynecology Contact information: [address] [place] [address] [phone]           Newborn Data: Live born female  Birth Weight:   APGAR: 8, 9  Newborn Delivery   Time head delivered:  04/23/2018 16:13:05 Birth date/time:  04/23/2018 16:13:00 Delivery type:  Vaginal, Forceps   Contraception: undecided, considering Mirena IUD  Home with mother.  Oswaldo Conroy 04/25/2018, 8:59 AM

## 2018-04-23 NOTE — H&P (Signed)
Obstetric H&P   Chief Complaint: contrctions  Prenatal Care Provider: [facility] History of Present Illness: 22 y.o. G1P0 19w0dby 04/23/2018, by 8 week Ultrasound presenting to L&D with contractions.  No LOF, no VB, +FM.  Pregnancy uncomplicated to date.    Pregravid weight 77.1 kg Total Weight Gain 21.3 kg  FIRST Problems (from 09/07/17 to present)    Problem Noted Resolved   Round ligament pain 12/13/2017 by HGae Dry MD No   Back pain affecting pregnancy in second trimester 11/04/2017 by SHomero Fellers MD No   Overview Signed 11/04/2017 12:09 PM by SHomero Fellers MD    _0  ambulatory referral to physical therapy      Supervision of normal pregnancy in third trimester 09/07/2017 by SRexene Agent CNM No   Overview Addendum 04/19/2018  1:28 AM by SHomero Fellers MD    Clinic [facility] Prenatal Labs  Dating Ultrasound 09/13/2017 (8week) Blood type: A, Positive-- (03/13 0000)   Genetic Screen : NIPS: normal XY Antibody:Negative (03/13 0000)  Anatomic [place]oreacomplete Rubella: 1.58 (04/02 0936) Varicella: Immune (03/13 0000)  GTT 140  3hr: 1 of 4 values elevated RPR: Nonreactive (03/13 0000)   Rhogam  not applicable HBsAg: Negative (03/13 0000)   TDaP vaccine   02/24/18                     Flu Shot: 09/06/17& 02/24/18 HIV: Non Reactive (04/02 0936)   Baby Food Breast and formula                            GBS: negative  Contraception  Nuvaring Pap: NIL 2019  Support Person  Alex             Maternal tobacco use, third trimester 09/27/2017 by SHomero Fellers MD 03/31/2018 by GRod Can CNM       Review of Systems: 10 point review of systems negative unless otherwise noted in HPI  Past Medical History: Past Medical History:  Diagnosis Date  . ADD (attention deficit disorder)   . Anemia   . Asthma   . Headache   . History of substance abuse ([facility]     Past Surgical History: Past Surgical History:  Procedure Laterality Date  .  TONSILLECTOMY  2014   Pt not sure if addenoids taken  . WISDOM TOOTH EXTRACTION  2014    Past Obstetric History: #: 1, Date: None, Sex: None, Weight: None, GA: None, Delivery: None, Apgar1: None, Apgar5: None, Living: None, Birth Comments: None   Past Gynecologic History:  Family History: Family History  Problem Relation Age of Onset  . Hypertension Mother   . Hypertension Father   . Hypertension Maternal Grandmother   . Diabetes Maternal Grandmother   . Thyroid disease Maternal Grandmother   . Hypertension Maternal Grandfather   . Thyroid disease Maternal Grandfather   . Hypertension Paternal Grandmother   . Thyroid disease Paternal Grandmother   . Hypertension Paternal Grandfather   . Thyroid disease Paternal Grandfather   . Hypertension Maternal Aunt   . Hypertension Maternal Uncle   . Hypertension Paternal Aunt   . Diabetes Paternal Aunt   . Hypertension Paternal Uncle     Social History: Social History   Socioeconomic History  . Marital status: Single    Spouse name: Not on file  . Number of children: 0  . Years of education: 172 . Highest education level:  Not on file  Occupational History  . Occupation: Scientist, water quality    Comment: Advertising copywriter  . Occupation: call center  Social Needs  . Financial resource strain: Not on file  . Food insecurity:    Worry: Not on file    Inability: Not on file  . Transportation needs:    Medical: Not on file    Non-medical: Not on file  Tobacco Use  . Smoking status: Former Smoker    Packs/day: 1.00    Years: 10.00    Pack years: 10.00    Types: Cigarettes  . Smokeless tobacco: Never Used  Substance and Sexual Activity  . Alcohol use: No    Frequency: Never  . Drug use: Yes    Types: Marijuana    Comment: hx of cocaine, meth, and acid use-Last used 2017  . Sexual activity: Yes    Birth control/protection: Other-see comments    Comment: Mirena or Nuva Riing  Lifestyle  . Physical activity:    Days per week: Not on file      Minutes per session: Not on file  . Stress: Not on file  Relationships  . Social connections:    Talks on phone: Not on file    Gets together: Not on file    Attends religious service: Not on file    Active member of club or organization: Not on file    Attends meetings of clubs or organizations: Not on file    Relationship status: Not on file  . Intimate partner violence:    Fear of current or ex partner: Not on file    Emotionally abused: Not on file    Physically abused: Not on file    Forced sexual activity: Not on file  Other Topics Concern  . Not on file  Social History Narrative  . Not on file    Medications: Prior to Admission medications   Medication Sig Start Date End Date Taking? Authorizing Provider  ferrous sulfate (FERROUSUL) 325 (65 FE) MG tablet Take 1 tablet (325 mg total) by mouth daily with breakfast. 02/01/18  Yes Rexene Agent, CNM  loratadine (CLARITIN) 10 MG tablet Take 10 mg by mouth daily.   Yes [provider]  Prenat-FeCbn-FeAsp-Meth-FA-DHA (PRENATE MINI) 18-0.6-0.4-350 MG CAPS Take 1 capsule by mouth daily. 09/13/17  Yes Rexene Agent, CNM  montelukast (SINGULAIR) 10 MG tablet TAKE 1 TABLET BY MOUTH EVERYDAY AT BEDTIME 02/02/18   [provider]    Allergies: Allergies  Allergen Reactions  . Amoxicillin   . Penicillins   . Pollen Extract     Physical Exam: Vitals: Last menstrual period 06/17/2017.  Urine Dip Protein: N/A  FHT: 130, moderate, +accels, no decels Toco: q4mn  General: NAD HEENT: normocephalic, anicteric Pulmonary: No increased work of breathing Cardiovascular: RRR, distal pulses 2+ Abdomen: Gravid, non-tender Leopolds: vtx Genitourinary:  Dilation: 4.5 Effacement (%): 80 Cervical Position: Posterior Station: -1 Presentation: Vertex Exam by:: B.Zachery Dakins Extremities: no edema, erythema, or tenderness Neurologic: Grossly intact Psychiatric: mood appropriate, affect full  Labs: Results for  orders placed or performed during the hospital encounter of 04/22/18 (from the past 24 hour(s))  ROM Plus ([facility] only)     Status: None   Collection Time: 04/22/18  6:54 AM  Result Value Ref Range   Rom Plus NEGATIVE     Assessment: 22y.o. G1P0 481w0dy 04/23/2018, by 8 week [place] labor  Plan: 1) Labor - admit for term labor, expectant management  2) Fetus - cat I tracing  3) PNL - Blood type A/Negative, Positive, Negative/-- (03/13 0000) / Anti-bodyscreen Negative (03/13 0000) / Rubella 1.58 (04/02 0936) / Varicella Immune / RPR Non Reactive (08/02 1043) / HBsAg Negative (03/13 0000) / HIV Non Reactive (08/02 1043)/ GBS negative  4) Immunization History -  Immunization History  Administered Date(s) Administered  . DTaP 11/01/1994, 01/05/1995, 03/15/1995, 01/17/1996, 05/28/1999  . Hepatitis B, ped/adol 1995-09-29, 11/01/1994, 03/15/1995  . HiB (PRP-T) 11/01/1994, 01/05/1995, 03/15/1995, 01/17/1996  . IPV 11/01/1994, 01/05/1995, 03/15/1995, 05/28/1999  . Influenza,inj,Quad PF,6+ Mos 02/24/2018  . Influenza-Unspecified 09/06/2017  . MMR 01/17/1996, 05/28/1999  . Tdap 02/24/2018  . Varicella 01/17/1996    5) Disposition - pending delivery  Malachy Mood, MD, [place] 04/23/2018, 5:48 AM

## 2018-04-23 NOTE — Progress Notes (Signed)
   Subjective:  Comfortable, epidural in place  Objective:   Vitals: Blood pressure 130/79, pulse 72, temperature 98.2 F (36.8 C), temperature source Oral, resp. rate 16, last menstrual period 06/17/2017, SpO2 99 %. General: NAD Abdomen: gravid, non-tender Cervical Exam:  Dilation: 7 Effacement (%): 90 Cervical Position: Posterior Station: -1 Presentation: Vertex Exam by:: Dr. Bonney Aid  FHT: 120, moderate, +accels, no decels Toco: q14min  Results for orders placed or performed during the hospital encounter of 04/23/18 (from the past 24 hour(s))  CBC     Status: Abnormal   Collection Time: 04/23/18  6:00 AM  Result Value Ref Range   WBC 16.9 (H) 4.0 - 10.5 K/uL   RBC 4.17 3.87 - 5.11 MIL/uL   Hemoglobin 12.9 12.0 - 15.0 g/dL   HCT 16.1 09.6 - 04.5 %   MCV 90.6 80.0 - 100.0 fL   MCH 30.9 26.0 - 34.0 pg   MCHC 34.1 30.0 - 36.0 g/dL   RDW 40.9 81.1 - 91.4 %   Platelets 158 150 - 400 K/uL   nRBC 0.0 0.0 - 0.2 %  Type and screen [facility]     Status: None   Collection Time: 04/23/18  6:00 AM  Result Value Ref Range   ABO/RH(D) A POS    Antibody Screen NEG    Sample Expiration      04/26/2018 Performed at [facility], [address] [place], [place] [address]   Urine Drug Screen, Qualitative ([facility] only)     Status: None   Collection Time: 04/23/18  6:00 AM  Result Value Ref Range   Tricyclic, Ur Screen NONE DETECTED NONE DETECTED   Amphetamines, Ur Screen NONE DETECTED NONE DETECTED   MDMA (Ecstasy)Ur Screen NONE DETECTED NONE DETECTED   Cocaine Metabolite,Ur [place] NONE DETECTED NONE DETECTED   Opiate, Ur Screen NONE DETECTED NONE DETECTED   Phencyclidine (PCP) Ur S NONE DETECTED NONE DETECTED   Cannabinoid 50 Ng, Ur [place] NONE DETECTED NONE DETECTED   Barbiturates, Ur Screen NONE DETECTED NONE DETECTED   Benzodiazepine, Ur Scrn NONE DETECTED NONE DETECTED   Methadone Scn, Ur NONE DETECTED NONE DETECTED    Assessment:   22 y.o. G1P0  [redacted]w[redacted]d term labor  Plan:   1) Labor - [facility] clear  2) Fetus - cat I tracing  Vena Austria, MD, [facility], [facility] 04/23/2018, 8:53 AM

## 2018-04-23 NOTE — Lactation Note (Signed)
This note was copied from a baby's chart. Lactation Consultation Note  Patient Name: Beth Willis [id number] Date: 04/23/2018 Reason for consult: Initial assessment;Primapara;Term;Mother's request Assisted mom with first breast feeding in birthplace.  Beth Willis was already skin to skin.  Guided him near the breast.  He latched with minimal assistance and began good rhythmic sucking with occasional swallows.  Discussed supply and demand, normal course of lactation and routine newborn feeding patterns.  Encouraged to exclusively breast feed to bring in mature milk and ensure a plentiful milk supply.  Maternal Data Formula Feeding for Exclusion: No Has patient been taught Hand Expression?: Yes Does the patient have breastfeeding experience prior to this delivery?: No  Feeding Feeding Type: Breast Fed  LATCH Score Latch: Grasps breast easily, tongue down, lips flanged, rhythmical sucking.  Audible Swallowing: A few with stimulation  Type of Nipple: Everted at rest and after stimulation  Comfort (Breast/Nipple): Soft / non-tender  Hold (Positioning): Assistance needed to correctly position infant at breast and maintain latch.  LATCH Score: 8  Interventions Interventions: Breast feeding basics reviewed;Position options;Assisted with latch;Reverse pressure;Skin to skin;Breast compression;Breast massage;Adjust position;Support pillows  Lactation Tools Discussed/Used WIC Program: [employer] has [employer])   Consult Status Consult Status: PRN    Louis Meckel 04/23/2018, 6:27 PM

## 2018-04-23 NOTE — Anesthesia Procedure Notes (Signed)
Epidural Patient location during procedure: OB Start time: 04/23/2018 7:54 AM End time: 04/23/2018 8:01 AM  Staffing Anesthesiologist: Lenard Simmer, MD Performed: anesthesiologist   Preanesthetic Checklist Completed: patient identified, site marked, surgical consent, pre-op evaluation, timeout performed, IV checked, risks and benefits discussed and monitors and equipment checked  Epidural Patient position: sitting Prep: ChloraPrep Patient monitoring: heart rate, continuous pulse ox and blood pressure Approach: midline Location: L3-L4 Injection technique: LOR saline  Needle:  Needle type: Tuohy  Needle gauge: 17 G Needle length: 9 cm and 9 Needle insertion depth: 4 cm Catheter type: closed end flexible Catheter size: 19 Gauge Catheter at skin depth: 9 cm Test dose: negative and 1.5% lidocaine with Epi 1:200 K  Assessment Sensory level: T10 Events: blood not aspirated, injection not painful, no injection resistance, negative IV test and no paresthesia  Additional Notes 1st attempt Pt. Evaluated and documentation done after procedure finished. Patient identified. Risks/Benefits/Options discussed with patient including but not limited to bleeding, infection, nerve damage, paralysis, failed block, incomplete pain control, headache, blood pressure changes, nausea, vomiting, reactions to medication both or allergic, itching and postpartum back pain. Confirmed with bedside nurse the patient's most recent platelet count. Confirmed with patient that they are not currently taking any anticoagulation, have any bleeding history or any family history of bleeding disorders. Patient expressed understanding and wished to proceed. All questions were answered. Sterile technique was used throughout the entire procedure. Please see nursing notes for vital signs. Test dose was given through epidural catheter and negative prior to continuing to dose epidural or start infusion. Warning signs of high  block given to the patient including shortness of breath, tingling/numbness in hands, complete motor block, or any concerning symptoms with instructions to call for help. Patient was given instructions on fall risk and not to get out of bed. All questions and concerns addressed with instructions to call with any issues or inadequate analgesia.   Patient tolerated the insertion well without immediate complications.Reason for block:procedure for pain

## 2018-04-24 ENCOUNTER — Encounter: Payer: Managed Care, Other (non HMO) | Admitting: Maternal Newborn

## 2018-04-24 LAB — CBC
HCT: 31.2 % — ABNORMAL LOW (ref 36.0–46.0)
Hemoglobin: 10.4 g/dL — ABNORMAL LOW (ref 12.0–15.0)
MCH: 30.9 pg (ref 26.0–34.0)
MCHC: 33.3 g/dL (ref 30.0–36.0)
MCV: 92.6 fL (ref 80.0–100.0)
PLATELETS: 121 10*3/uL — AB (ref 150–400)
RBC: 3.37 MIL/uL — AB (ref 3.87–5.11)
RDW: 13.9 % (ref 11.5–15.5)
WBC: 13.5 10*3/uL — AB (ref 4.0–10.5)
nRBC: 0 % (ref 0.0–0.2)

## 2018-04-24 NOTE — Progress Notes (Signed)
Post Partum Day 1 Subjective: no complaints, up ad lib, voiding and tolerating PO. Has been breast feeding. Thinking of switching to bottle because of breast tenderness  Objective: Blood pressure 118/82, pulse 82, temperature 97.7 F (36.5 C), temperature source Oral, resp. rate 18, height 5\' 7"  (1.702 m), weight 98.4 kg, last menstrual period 06/17/2017, SpO2 99 %, unknown if currently breastfeeding.  Physical Exam:  General: alert, cooperative and no distress Lochia: appropriate Uterine Fundus: firm/ U-1/ML/NT Perineum: no erythema, intact DVT Evaluation: No evidence of DVT seen on physical exam.  Recent Labs    04/23/18 0600 04/24/18 0528  HGB 12.9 10.4*  HCT 37.8 31.2*  WBC 16.9* 13.5*  PLT 158 121*    Assessment/Plan: PPD #1 Stable Plan discharge tomorrow A POS/ VI/ RI Breast/bottle Contraception:? TDAP and flu vaccine UTD   LOS: 1 day   Farrel Conners 04/24/2018, 10:00 AM

## 2018-04-24 NOTE — Lactation Note (Signed)
This note was copied from a baby's chart. Lactation Consultation Note  Patient Name: Beth Willis Today's Date: 04/24/2018 Reason for consult: Initial assessment   Maternal Data Formula Feeding for Exclusion: Yes Reason for exclusion: Mother's choice to formula and breast feed on admission Has patient been taught Hand Expression?: Yes Does the patient have breastfeeding experience prior to this delivery?: No  Feeding Feeding Type: Bottle Fed - Formula(per mom wanted to give bottle ) Nipple Type: Slow - flow  LATCH Score Latch: Repeated attempts needed to sustain latch, nipple held in mouth throughout feeding, stimulation needed to elicit sucking reflex.  Audible Swallowing: None  Type of Nipple: Everted at rest and after stimulation  Comfort (Breast/Nipple): Filling, red/small blisters or bruises, mild/mod discomfort  Hold (Positioning): Assistance needed to correctly position infant at breast and maintain latch.  LATCH Score: 5  Interventions Interventions: Breast feeding basics reviewed;Assisted with latch  Lactation Tools Discussed/Used     Consult Status Consult Status: Follow-up Date: 04/24/18 Follow-up type: In-patient  LC called to room to assist with latch and positioning. Upon arrival, mom is attempting to latch infant but is having trouble. LC assisted by expressing a little colostrum and attempting to latch infant again. It is difficult to achieve a deep latch and mother states that her nipples are very sore. Mother states that she only wanted to try to see how breastfeeding would go but she wants to just give infant the bottle at this point. LC discussed the option of trying with the nipple shield and also initiating pumping to still provide infant with her breastmilk and asked mother her thoughts on this option. Mother states that she is unsure and would like to let her nipples heal for now and think about it.  Arlyss Gandy 04/24/2018, 12:09 PM

## 2018-04-24 NOTE — Clinical Social Work Maternal (Signed)
  CLINICAL SOCIAL WORK MATERNAL/CHILD NOTE  Patient Details  Name: Beth Willis MRN: [id number] Date of Birth: 1995-09-10  Date:  04/24/2018  Clinical Social Worker Initiating Note:  Shela Leff MSW,LSCW Date/Time: Initiated:  04/24/18/      Child's Name:      Biological Parents:  Mother, Father   Need for Interpreter:  None   Reason for Referral:      Address:  [address] [place] [address]    Phone number:  [phone] (home)     Additional phone number: none  Household Members/Support Persons (HM/SP):       HM/SP Name Relationship DOB or Age  HM/SP -1        HM/SP -2        HM/SP -3        HM/SP -4        HM/SP -5        HM/SP -6        HM/SP -7        HM/SP -8          Natural Supports (not living in the home):  Extended Family   Professional Supports:     Employment:     Type of Work:     Education:      Homebound arranged:    Pensions consultant:  Multimedia programmer   Other Resources:      Cultural/Religious Considerations Which May Impact Care:  none  Strengths:  Ability to meet basic needs , Home prepared for child    Psychotropic Medications:         Pediatrician:       Pediatrician List:   [place]      Pediatrician Fax Number:    Risk Factors/Current Problems:  Substance Use    Cognitive State:  Alert , Able to Concentrate    Mood/Affect:  Happy , Calm    CSW Assessment: CSW met with patient, father of baby and sister in law at bedside. Patient gave permission to speak in front of her relatives. Patient reports that she and father of baby will be living with in the home and that her mother will be staying for a week. Patient reports having all necessities for her newborn. They have transportation. She denies any mental health issues or history.  She received education regarding postpartum depression during her  prenatal care. Patient states that she used marijuana in the first trimester only for nausea. She states she has no intention of continuing to use. CSW explained that she was positive for marijuana but that her baby's urine drug screen was negative. CSW explained that the cord tissue is pending and that if it returns positive, a DSS CPS will need to be made. She verbalized understanding and asked appropriate questions.  CSW Plan/Description:  CSW Will Continue to Monitor Umbilical Cord Tissue Drug Screen Results and Make Report if [facility], LCSW 04/24/2018, 11:13 AM

## 2018-04-24 NOTE — Anesthesia Postprocedure Evaluation (Signed)
Anesthesia Post Note  Patient: Advice worker  Procedure(s) Performed: AN AD HOC LABOR EPIDURAL  Patient location during evaluation: Mother Baby Anesthesia Type: Epidural Level of consciousness: awake and alert and oriented Pain management: pain level controlled Vital Signs Assessment: post-procedure vital signs reviewed and stable Respiratory status: spontaneous breathing Cardiovascular status: stable Postop Assessment: no backache, no apparent nausea or vomiting, adequate PO intake and able to ambulate Anesthetic complications: no     Last Vitals:  Vitals:   04/23/18 2356 04/24/18 0247  BP: 112/66 117/83  Pulse: 94 (!) 103  Resp: 18 16  Temp: 36.7 C 36.6 C  SpO2: 97% 98%    Last Pain:  Vitals:   04/24/18 0350  TempSrc:   PainSc: Stephan Minister

## 2018-04-24 NOTE — Plan of Care (Signed)
  Problem: Activity: Goal: Will verbalize the importance of balancing activity with adequate rest periods Outcome: Progressing   Problem: Activity: Goal: Ability to tolerate increased activity will improve Outcome: Progressing   Problem: Role Relationship: Goal: Ability to demonstrate positive interaction with newborn will improve Outcome: Progressing

## 2018-04-25 LAB — RPR: RPR: NONREACTIVE

## 2018-04-25 NOTE — Progress Notes (Signed)
Patient discharged home with infant. Discharge instructions, prescriptions and follow up appointment given to and reviewed with patient. Patient verbalized understanding. Patient wheeled out with infant by auxiliary.  

## 2018-05-23 ENCOUNTER — Ambulatory Visit (INDEPENDENT_AMBULATORY_CARE_PROVIDER_SITE_OTHER): Payer: Managed Care, Other (non HMO) | Admitting: Obstetrics and Gynecology

## 2018-05-23 ENCOUNTER — Encounter: Payer: Self-pay | Admitting: Obstetrics and Gynecology

## 2018-05-23 DIAGNOSIS — Z1389 Encounter for screening for other disorder: Secondary | ICD-10-CM

## 2018-05-23 DIAGNOSIS — Z30430 Encounter for insertion of intrauterine contraceptive device: Secondary | ICD-10-CM

## 2018-05-23 NOTE — Progress Notes (Signed)
Postpartum Visit  Chief Complaint:  Chief Complaint  Patient presents with  . Postpartum Care    Vaginal delivery 10/27    History of Present Illness: Patient is a 22 y.o. G1P1001 presents for postpartum visit.  Date of delivery: 04/23/2018 Type of delivery: Vaginal delivery - Vacuum or forceps assisted  yes Episiotomy No.  Laceration: yes 1st degree Pregnancy or labor problems:  no Any problems since the delivery:  no  Newborn Details:  SINGLETON :  1. BabyGender female.  Maternal Breast or formula feeding: plans to breastfeed Contraception after delivery: No  Any bowel or bladder issues: No  Post partum depression/anxiety noted:  no Edinburgh Post-Partum Depression Score:1 Date of last PAP: 09/27/2017  no abnormalities   Review of Systems: Review of Systems  Constitutional: Negative for chills, diaphoresis, fever, malaise/fatigue and weight loss.  Gastrointestinal: Negative for abdominal pain, constipation, diarrhea, heartburn, nausea and vomiting.  Genitourinary: Negative for dysuria, frequency and urgency.  Skin: Negative for itching and rash.  Psychiatric/Behavioral: Negative for depression.    The following portions of the patient's history were reviewed and updated as appropriate: allergies, current medications, past family history, past medical history, past social history, past surgical history and problem list.  Past Medical History:  Past Medical History:  Diagnosis Date  . ADD (attention deficit disorder)   . Anemia   . Asthma   . Headache   . History of substance abuse [facility](HCC)     Past Surgical History:  Past Surgical History:  Procedure Laterality Date  . TONSILLECTOMY  2014   Pt not sure if addenoids taken  . WISDOM TOOTH EXTRACTION  2014    Family History:  Family History  Problem Relation Age of Onset  . Hypertension Mother   . Hypertension Father   . Hypertension Maternal Grandmother   . Diabetes Maternal Grandmother   . Thyroid  disease Maternal Grandmother   . Hypertension Maternal Grandfather   . Thyroid disease Maternal Grandfather   . Hypertension Paternal Grandmother   . Thyroid disease Paternal Grandmother   . Hypertension Paternal Grandfather   . Thyroid disease Paternal Grandfather   . Hypertension Maternal Aunt   . Hypertension Maternal Uncle   . Hypertension Paternal Aunt   . Diabetes Paternal Aunt   . Hypertension Paternal Uncle     Social History:  Social History   Socioeconomic History  . Marital status: Single    Spouse name: Not on file  . Number of children: 0  . Years of education: 3712  . Highest education level: Not on file  Occupational History  . Occupation: Conservation officer, naturecashier    Comment: [employer]  . Occupation: call center  Social Needs  . Financial resource strain: Not on file  . Food insecurity:    Worry: Not on file    Inability: Not on file  . Transportation needs:    Medical: Not on file    Non-medical: Not on file  Tobacco Use  . Smoking status: Former Smoker    Packs/day: 1.00    Years: 10.00    Pack years: 10.00    Types: Cigarettes  . Smokeless tobacco: Never Used  Substance and Sexual Activity  . Alcohol use: No    Frequency: Never  . Drug use: Yes    Types: Marijuana    Comment: hx of cocaine, meth, and acid use-Last used 2017  . Sexual activity: Yes    Birth control/protection: Other-see comments    Comment: Mirena or  Nuva Riing  Lifestyle  . Physical activity:    Days per week: Not on file    Minutes per session: Not on file  . Stress: Not on file  Relationships  . Social connections:    Talks on phone: Not on file    Gets together: Not on file    Attends religious service: Not on file    Active member of club or organization: Not on file    Attends meetings of clubs or organizations: Not on file    Relationship status: Not on file  . Intimate partner violence:    Fear of current or ex partner: Not on file    Emotionally abused: Not on file     Physically abused: Not on file    Forced sexual activity: Not on file  Other Topics Concern  . Not on file  Social History Narrative  . Not on file    Allergies:  Allergies  Allergen Reactions  . Amoxicillin   . Penicillins   . Pollen Extract     Medications: Prior to Admission medications   Medication Sig Start Date End Date Taking? Authorizing Provider  loratadine (CLARITIN) 10 MG tablet Take 10 mg by mouth daily.    [provider]  montelukast (SINGULAIR) 10 MG tablet TAKE 1 TABLET BY MOUTH EVERYDAY AT BEDTIME 02/02/18   [provider]  Prenat-FeCbn-FeAsp-Meth-FA-DHA (PRENATE MINI) 18-0.6-0.4-350 MG CAPS Take 1 capsule by mouth daily. 09/13/17   Oswaldo Conroy, CNM    Physical Exam Blood pressure 126/88, pulse 98, height 5\' 6"  (1.676 m), weight 207 lb (93.9 kg), not currently breastfeeding.    General: NAD HEENT: normocephalic, anicteric Pulmonary: No increased work of breathing Abdomen: NABS, soft, non-tender, non-distended.  Umbilicus without lesions.  No hepatomegaly, splenomegaly or masses palpable. No evidence of hernia. Genitourinary:  External: Normal external female genitalia.  Normal urethral meatus, normal  Bartholin's and Skene's glands.    Vagina: Normal vaginal mucosa, no evidence of prolapse.    Cervix: Grossly normal in appearance, no bleeding  Uterus: Non-enlarged, mobile, normal contour.  No CMT  Adnexa: ovaries non-enlarged, no adnexal masses  Rectal: deferred Extremities: no edema, erythema, or tenderness Neurologic: Grossly intact Psychiatric: mood appropriate, affect full  Edinburgh Postnatal Depression Scale - 05/23/18 1427      Edinburgh Postnatal Depression Scale:  In the Past 7 Days   I have been able to laugh and see the funny side of things.  0    I have looked forward with enjoyment to things.  0    I have blamed myself unnecessarily when things went wrong.  0    I have been anxious or worried for no good reason.  1      I have felt scared or panicky for no good reason.  0    Things have been getting on top of me.  0    I have been so unhappy that I have had difficulty sleeping.  0    I have felt sad or miserable.  0    I have been so unhappy that I have been crying.  0    The thought of harming myself has occurred to me.  0    Edinburgh Postnatal Depression Scale Total  1        GYNECOLOGY OFFICE PROCEDURE NOTE  Beth Willis is a 22 y.o. G1P1001 here for a Mirena IUD insertion.   The patient is currently using postpartum.  The indication for her  IUD is contraception.  IUD Insertion Procedure Note Patient identified, informed consent performed, consent signed.   Discussed risks of irregular bleeding, cramping, infection, malpositioning, expulsion or uterine perforation of the IUD (1:1000 placements)  which may require further procedure such as laparoscopy.  IUD while effective at preventing pregnancy do not prevent transmission of sexually transmitted diseases and use of barrier methods for this purpose was discussed. Time out was performed.  Urine pregnancy test negative.  Speculum placed in the vagina.  Cervix visualized.  Cleaned with Betadine x 2.  Grasped anteriorly with a single tooth tenaculum.  Uterus sounded to 8cm. IUD placed per manufacturer's recommendations.  Strings trimmed to 3 cm. Tenaculum was removed, good hemostasis noted.  Patient tolerated procedure well.   Patient was given post-procedure instructions.  She was advised to have backup contraception for one week.  Patient was also asked to check IUD strings periodically and follow up in 6 weeks for IUD check.  IUD insertion CPT 58300,  Skyla J7301 Mirena J7298 Liletta J7297 Paraguard J7300 Rutha Bouchard Z6109 Modifer 25, plus Modifer 79 is done during a global billing visit   Assessment: 22 y.o. G1P1001 presenting for 6 week postpartum visit  Plan: Problem List Items Addressed This Visit    None    Visit Diagnoses    6 weeks  postpartum follow-up    -  Primary   Encounter for IUD insertion           1) Contraception - Education given regarding options for contraception, as well as compatibility with breast feeding if applicable.  Patient plans on IUD for contraception.  2)  Pap - ASCCP guidelines and rational discussed.  ASCCP guidelines and rational discussed.  Patient opts for every 3 years screening interval  3) Patient underwent screening for postpartum depression with no signs of depression  4) Return in about 6 weeks (around 07/04/2018) for IUD string check.   Vena Austria, MD, Evern Core [facility], [facility] 05/23/2018, 2:04 PM

## 2018-05-24 ENCOUNTER — Other Ambulatory Visit: Payer: Self-pay | Admitting: Maternal Newborn

## 2018-05-24 DIAGNOSIS — O99013 Anemia complicating pregnancy, third trimester: Secondary | ICD-10-CM

## 2018-07-04 ENCOUNTER — Encounter: Payer: Self-pay | Admitting: Obstetrics and Gynecology

## 2018-07-04 ENCOUNTER — Ambulatory Visit (INDEPENDENT_AMBULATORY_CARE_PROVIDER_SITE_OTHER): Payer: Managed Care, Other (non HMO) | Admitting: Obstetrics and Gynecology

## 2018-07-04 VITALS — BP 110/70 | HR 97 | Wt 213.0 lb

## 2018-07-04 DIAGNOSIS — Z30431 Encounter for routine checking of intrauterine contraceptive device: Secondary | ICD-10-CM

## 2018-07-04 NOTE — Progress Notes (Signed)
Obstetrics & Gynecology Office Visit   Chief Complaint:  Chief Complaint  Patient presents with  . Follow-up    IUD string check    History of Present Illness: 23 y.o. patient presenting for follow up of Mirena IUD placement 6 week ago.  The indication for her IUD was contraception.  She denies any complications since her IUD placement.  Still having some occasional spotting.  is able to feel strings.    Review of Systems: Review of Systems  Constitutional: Negative.   Gastrointestinal: Negative.   Genitourinary: Negative.     Past Medical History:  Past Medical History:  Diagnosis Date  . ADD (attention deficit disorder)   . Anemia   . Asthma   . Headache   . History of substance abuse [facility])     Past Surgical History:  Past Surgical History:  Procedure Laterality Date  . TONSILLECTOMY  2014   Pt not sure if addenoids taken  . WISDOM TOOTH EXTRACTION  2014    Gynecologic History: No LMP recorded. (Menstrual status: IUD).  Obstetric History: G1P1001  Family History:  Family History  Problem Relation Age of Onset  . Hypertension Mother   . Hypertension Father   . Hypertension Maternal Grandmother   . Diabetes Maternal Grandmother   . Thyroid disease Maternal Grandmother   . Hypertension Maternal Grandfather   . Thyroid disease Maternal Grandfather   . Hypertension Paternal Grandmother   . Thyroid disease Paternal Grandmother   . Hypertension Paternal Grandfather   . Thyroid disease Paternal Grandfather   . Hypertension Maternal Aunt   . Hypertension Maternal Uncle   . Hypertension Paternal Aunt   . Diabetes Paternal Aunt   . Hypertension Paternal Uncle     Social History:  Social History   Socioeconomic History  . Marital status: Single    Spouse name: Not on file  . Number of children: 0  . Years of education: 47  . Highest education level: Not on file  Occupational History  . Occupation: Conservation officer, nature    Comment: Actor  . Occupation: call  center  Social Needs  . Financial resource strain: Not on file  . Food insecurity:    Worry: Not on file    Inability: Not on file  . Transportation needs:    Medical: Not on file    Non-medical: Not on file  Tobacco Use  . Smoking status: Former Smoker    Packs/day: 1.00    Years: 10.00    Pack years: 10.00    Types: Cigarettes  . Smokeless tobacco: Never Used  Substance and Sexual Activity  . Alcohol use: No    Frequency: Never  . Drug use: Yes    Types: Marijuana    Comment: hx of cocaine, meth, and acid use-Last used 2017  . Sexual activity: Yes    Birth control/protection: Other-see comments    Comment: MIrena  Lifestyle  . Physical activity:    Days per week: Not on file    Minutes per session: Not on file  . Stress: Not on file  Relationships  . Social connections:    Talks on phone: Not on file    Gets together: Not on file    Attends religious service: Not on file    Active member of club or organization: Not on file    Attends meetings of clubs or organizations: Not on file    Relationship status: Not on file  . Intimate partner violence:  Fear of current or ex partner: Not on file    Emotionally abused: Not on file    Physically abused: Not on file    Forced sexual activity: Not on file  Other Topics Concern  . Not on file  Social History Narrative  . Not on file    Allergies:  Allergies  Allergen Reactions  . Amoxicillin   . Penicillins   . Pollen Extract     Medications: Prior to Admission medications   Medication Sig Start Date End Date Taking? Authorizing Provider  levonorgestrel (MIRENA) 20 MCG/24HR IUD 1 each by Intrauterine route once.   Yes [provider]  ferrous sulfate 325 (65 FE) MG tablet TAKE 1 TABLET BY MOUTH EVERY DAY WITH BREAKFAST 05/24/18   Oswaldo ConroySchmid, Jacelyn Y, CNM  loratadine (CLARITIN) 10 MG tablet Take 10 mg by mouth daily.    [provider]  montelukast (SINGULAIR) 10 MG tablet TAKE 1 TABLET BY MOUTH  EVERYDAY AT BEDTIME 02/02/18   [provider]  Prenat-FeCbn-FeAsp-Meth-FA-DHA (PRENATE MINI) 18-0.6-0.4-350 MG CAPS Take 1 capsule by mouth daily. 09/13/17   Oswaldo ConroySchmid, Jacelyn Y, CNM    Physical Exam Blood pressure 110/70, pulse 97, weight 213 lb (96.6 kg), not currently breastfeeding. No LMP recorded. (Menstrual status: IUD).  General: NAD HEENT: normocephalic, anicteric Pulmonary: No increased work of breathing  Genitourinary:  External: Normal external female genitalia.  Normal urethral meatus, normal  Bartholin's and Skene's glands.    Vagina: Normal vaginal mucosa, no evidence of prolapse.    Cervix: Grossly normal in appearance, no bleeding, IUD strings visualized 2cm  Uterus: Non-enlarged, mobile, normal contour.  No CMT  Adnexa: ovaries non-enlarged, no adnexal masses  Rectal: deferred  Lymphatic: no evidence of inguinal lymphadenopathy Extremities: no edema, erythema, or tenderness Neurologic: Grossly intact Psychiatric: mood appropriate, affect full  Female chaperone present for pelvic and breast  portions of the physical exam  Assessment: 23 y.o. G1P1001 IUD post placement check up  Plan: Problem List Items Addressed This Visit    None    Visit Diagnoses    IUD check up    -  Primary       1.  The patient was given instructions to check her IUD strings monthly and call with any problems or concerns.  She should call for fevers, chills, abnormal vaginal discharge, pelvic pain, or other complaints.  2.   IUDs while effective at preventing pregnancy do not prevent transmission of sexually transmitted diseases and use of barrier methods for this purpose was discussed.  Low overall incidence of failure with 99.7% efficacy rate in typical use.  The patient has not contraindication to IUD placement.  3.  She will return for a annual exam in 1 year.  All questions answered.  4) A total of 15 minutes were spent in face-to-face contact with the patient during this  encounter with over half of that time devoted to counseling and coordination of care.  5) Return in about 1 year (around 07/05/2019) for annual.   Vena AustriaAndreas Tully Mcinturff, MD, Merlinda FrederickFACOG [facility], [facility] 07/04/2018, 11:20 AM

## 2018-07-13 ENCOUNTER — Other Ambulatory Visit: Payer: Self-pay | Admitting: Otolaryngology

## 2018-07-13 DIAGNOSIS — H9211 Otorrhea, right ear: Secondary | ICD-10-CM

## 2018-07-17 ENCOUNTER — Ambulatory Visit: Payer: Managed Care, Other (non HMO) | Admitting: Family Medicine

## 2018-07-20 ENCOUNTER — Ambulatory Visit: Payer: Managed Care, Other (non HMO)

## 2018-07-27 ENCOUNTER — Ambulatory Visit: Admission: RE | Admit: 2018-07-27 | Payer: Managed Care, Other (non HMO) | Source: Ambulatory Visit

## 2018-07-31 ENCOUNTER — Ambulatory Visit
Admission: RE | Admit: 2018-07-31 | Discharge: 2018-07-31 | Disposition: A | Discharge status: Home / Self Care | Payer: Managed Care, Other (non HMO) | Source: Ambulatory Visit | Attending: Otolaryngology | Admitting: Otolaryngology

## 2018-07-31 DIAGNOSIS — H9211 Otorrhea, right ear: Secondary | ICD-10-CM | POA: Insufficient documentation

## 2018-08-04 ENCOUNTER — Inpatient Hospital Stay
Admission: RE | Admit: 2018-08-04 | Discharge: 2018-08-04 | Disposition: A | Discharge status: Home / Self Care | Payer: Managed Care, Other (non HMO) | Source: Ambulatory Visit

## 2018-08-07 ENCOUNTER — Encounter
Admission: RE | Admit: 2018-08-07 | Discharge: 2018-08-07 | Disposition: A | Discharge status: Home / Self Care | Payer: Managed Care, Other (non HMO) | Source: Ambulatory Visit | Attending: Otolaryngology | Admitting: Otolaryngology

## 2018-08-07 ENCOUNTER — Other Ambulatory Visit: Payer: Self-pay

## 2018-08-07 HISTORY — DX: Gastro-esophageal reflux disease without esophagitis: K21.9

## 2018-08-07 NOTE — Patient Instructions (Signed)
[facility]: [facility] 2nd floor medical mall [facility] [address](Medical Mall Entrance-take elevator on left to 2nd floor.  Check in with surgery information desk.) To find out your arrival time please call [phone] between 1PM - 3PM on 08-15-18 TUESDAY  Remember: Instructions that are not followed completely may result in serious medical risk, up to and including death, or upon the discretion of your surgeon and anesthesiologist your surgery may need to be rescheduled.    _x___ 1. Do not eat food after midnight the night before your procedure. NO GUM OR CANDY AFTER MIDNIGHT.  You may drink clear liquids up to 2 hours before you are scheduled to arrive at the hospital for your procedure.  Do not drink clear liquids within 2 hours of your scheduled arrival to the hospital.  Clear liquids include  --Water or Apple juice without pulp  --Clear carbohydrate beverage such as ClearFast or Gatorade  --Black Coffee or Clear Tea (No milk, no creamers, do not add anything to the coffee or Tea   ____Ensure clear carbohydrate drink on the way to the hospital for bariatric patients  ____Ensure clear carbohydrate drink 3 hours before surgery for Dr Rutherford NailByrnett's patients if physician instructed.     __x__ 2. No Alcohol for 24 hours before or after surgery.   __x__3. No Smoking or e-cigarettes for 24 prior to surgery.  Do not use any chewable tobacco products for at least 6 hour prior to surgery   ____  4. Bring all medications with you on the day of surgery if instructed.    __x__ 5. Notify your doctor if there is any change in your medical condition     (cold, fever, infections).    x___6. On the morning of surgery brush your teeth with toothpaste and water.  You may rinse your mouth with mouth wash if you wish.  Do not swallow any toothpaste or mouthwash.   Do not wear jewelry, make-up, hairpins, clips or nail polish.  Do not wear lotions, powders, or perfumes.  You may wear deodorant.  Do not shave 48 hours prior to surgery. Men may shave face and neck.  Do not bring valuables to the hospital.    [facility] any belongings or valuables.               Contacts, dentures or bridgework may not be worn into surgery.  Leave your suitcase in the car. After surgery it may be brought to your room.  For patients admitted to the hospital, discharge time is determined by your  treatment team.  _  Patients discharged the day of surgery will not be allowed to drive home.  You will need someone to drive you home and stay with you the night of your procedure.    Please read over the following fact sheets that you were given:   [facility] Pre[facility]  ____ Take anti-hypertensive listed below, cardiac, seizure, asthma, anti-reflux and psychiatric medicines. These include:  1. NONE  2.  3.  4.  5.  6.  ____Fleets enema or Magnesium Citrate as directed.   ____ Use CHG Soap or sage wipes as directed on instruction sheet   ____ Use inhalers on the day of surgery and bring to hospital day of surgery  ____ Stop Metformin and Janumet 2 days prior to surgery.    ____ Take 1/2 of usual insulin dose the night before surgery and none  on the morning surgery.   ____ Follow recommendations from Cardiologist, Pulmonologist or PCP regarding stopping Aspirin, Coumadin, Plavix ,Eliquis, Effient, or Pradaxa, and Pletal.  X____Stop Anti-inflammatories such as Advil, Aleve, Ibuprofen, Motrin, Naproxen, Naprosyn, Goodies powders or aspirin products NOW-OK to take Tylenol    _x___ Stop supplements until after surgery.     ____ Bring C-Pap to the hospital.

## 2018-08-09 ENCOUNTER — Encounter
Admission: RE | Admit: 2018-08-09 | Discharge: 2018-08-09 | Disposition: A | Discharge status: Home / Self Care | Payer: Managed Care, Other (non HMO) | Source: Ambulatory Visit | Attending: Otolaryngology | Admitting: Otolaryngology

## 2018-08-09 DIAGNOSIS — Z01812 Encounter for preprocedural laboratory examination: Secondary | ICD-10-CM | POA: Diagnosis present

## 2018-08-09 LAB — CBC
HCT: 40 % (ref 36.0–46.0)
Hemoglobin: 12.9 g/dL (ref 12.0–15.0)
MCH: 28 pg (ref 26.0–34.0)
MCHC: 32.3 g/dL (ref 30.0–36.0)
MCV: 87 fL (ref 80.0–100.0)
PLATELETS: 248 10*3/uL (ref 150–400)
RBC: 4.6 MIL/uL (ref 3.87–5.11)
RDW: 13.2 % (ref 11.5–15.5)
WBC: 9.5 10*3/uL (ref 4.0–10.5)
nRBC: 0 % (ref 0.0–0.2)

## 2018-08-16 ENCOUNTER — Ambulatory Visit: Payer: Managed Care, Other (non HMO)

## 2018-08-16 ENCOUNTER — Ambulatory Visit
Admission: RE | Admit: 2018-08-16 | Discharge: 2018-08-16 | Disposition: A | Discharge status: Home / Self Care | Payer: Managed Care, Other (non HMO) | Attending: Otolaryngology | Admitting: Otolaryngology

## 2018-08-16 ENCOUNTER — Other Ambulatory Visit: Payer: Self-pay

## 2018-08-16 ENCOUNTER — Encounter: Payer: Self-pay | Admitting: *Deleted

## 2018-08-16 ENCOUNTER — Encounter
Admission: RE | Disposition: A | Discharge status: Home / Self Care | Payer: Self-pay | Source: Home / Self Care | Attending: Otolaryngology

## 2018-08-16 DIAGNOSIS — H9211 Otorrhea, right ear: Secondary | ICD-10-CM | POA: Diagnosis present

## 2018-08-16 DIAGNOSIS — H6982 Other specified disorders of Eustachian tube, left ear: Secondary | ICD-10-CM | POA: Diagnosis not present

## 2018-08-16 DIAGNOSIS — H900 Conductive hearing loss, bilateral: Secondary | ICD-10-CM | POA: Insufficient documentation

## 2018-08-16 DIAGNOSIS — H7011 Chronic mastoiditis, right ear: Secondary | ICD-10-CM | POA: Diagnosis not present

## 2018-08-16 DIAGNOSIS — H7101 Cholesteatoma of attic, right ear: Secondary | ICD-10-CM | POA: Insufficient documentation

## 2018-08-16 DIAGNOSIS — Z87891 Personal history of nicotine dependence: Secondary | ICD-10-CM | POA: Insufficient documentation

## 2018-08-16 DIAGNOSIS — H7201 Central perforation of tympanic membrane, right ear: Secondary | ICD-10-CM | POA: Diagnosis not present

## 2018-08-16 HISTORY — PX: TYMPANOPLASTY WITH GRAFT: SHX6567

## 2018-08-16 HISTORY — PX: MYRINGOTOMY WITH TUBE PLACEMENT: SHX5663

## 2018-08-16 LAB — URINE DRUG SCREEN, QUALITATIVE (ARMC ONLY)
Amphetamines, Ur Screen: NOT DETECTED
Barbiturates, Ur Screen: NOT DETECTED
Benzodiazepine, Ur Scrn: NOT DETECTED
CANNABINOID 50 NG, UR ~~LOC~~: POSITIVE — AB
Cocaine Metabolite,Ur ~~LOC~~: NOT DETECTED
MDMA (Ecstasy)Ur Screen: NOT DETECTED
Methadone Scn, Ur: NOT DETECTED
Opiate, Ur Screen: NOT DETECTED
Phencyclidine (PCP) Ur S: NOT DETECTED
TRICYCLIC, UR SCREEN: NOT DETECTED

## 2018-08-16 LAB — POCT PREGNANCY, URINE: Preg Test, Ur: NEGATIVE

## 2018-08-16 SURGERY — TYMPANOPLASTY, USING GRAFT
Anesthesia: General | Laterality: Right

## 2018-08-16 MED ORDER — MEPERIDINE HCL 50 MG/ML IJ SOLN: 6.2500 mg | INTRAMUSCULAR | Status: DC | PRN

## 2018-08-16 MED ORDER — FENTANYL CITRATE (PF) 100 MCG/2ML IJ SOLN
INTRAMUSCULAR | Status: AC
  Filled 2018-08-16: qty 2

## 2018-08-16 MED ORDER — LIDOCAINE-EPINEPHRINE 1 %-1:100000 IJ SOLN
INTRAMUSCULAR | Status: AC
  Filled 2018-08-16: qty 1

## 2018-08-16 MED ORDER — OXYCODONE HCL 5 MG PO TABS: 5.0000 mg | ORAL_TABLET | Freq: Once | ORAL | Status: DC | PRN

## 2018-08-16 MED ORDER — LIDOCAINE-EPINEPHRINE (PF) 1 %-1:200000 IJ SOLN
INTRAMUSCULAR | Status: DC | PRN
  Administered 2018-08-16: 3 mL

## 2018-08-16 MED ORDER — MIDAZOLAM HCL 2 MG/2ML IJ SOLN
INTRAMUSCULAR | Status: AC
  Filled 2018-08-16: qty 2

## 2018-08-16 MED ORDER — PHENYLEPHRINE HCL 10 MG/ML IJ SOLN
INTRAMUSCULAR | Status: AC
  Filled 2018-08-16: qty 1

## 2018-08-16 MED ORDER — ACETAMINOPHEN 10 MG/ML IV SOLN
INTRAVENOUS | Status: DC | PRN
  Administered 2018-08-16: 1000 mg via INTRAVENOUS

## 2018-08-16 MED ORDER — DEXAMETHASONE SODIUM PHOSPHATE 4 MG/ML IJ SOLN
INTRAMUSCULAR | Status: AC
  Filled 2018-08-16: qty 1

## 2018-08-16 MED ORDER — GELATIN ABSORBABLE 12-7 MM EX MISC
CUTANEOUS | Status: AC
  Filled 2018-08-16: qty 1

## 2018-08-16 MED ORDER — MIDAZOLAM HCL 2 MG/2ML IJ SOLN
INTRAMUSCULAR | Status: DC | PRN
  Administered 2018-08-16: 2 mg via INTRAVENOUS

## 2018-08-16 MED ORDER — ONDANSETRON HCL 4 MG/2ML IJ SOLN
INTRAMUSCULAR | Status: AC
  Filled 2018-08-16: qty 2

## 2018-08-16 MED ORDER — LIDOCAINE HCL (PF) 2 % IJ SOLN
INTRAMUSCULAR | Status: AC
  Filled 2018-08-16: qty 10

## 2018-08-16 MED ORDER — GELATIN ABSORBABLE 12-7 MM EX MISC
CUTANEOUS | Status: DC | PRN
  Administered 2018-08-16: 1

## 2018-08-16 MED ORDER — SODIUM CHLORIDE (PF) 0.9 % IJ SOLN
INTRAMUSCULAR | Status: AC
  Filled 2018-08-16: qty 10

## 2018-08-16 MED ORDER — HYDROCODONE-ACETAMINOPHEN 5-325 MG PO TABS
ORAL_TABLET | ORAL | Status: AC
  Filled 2018-08-16: qty 1

## 2018-08-16 MED ORDER — PROPOFOL 10 MG/ML IV BOLUS
INTRAVENOUS | Status: DC | PRN
  Administered 2018-08-16: 20 mg via INTRAVENOUS
  Administered 2018-08-16: 200 mg via INTRAVENOUS
  Administered 2018-08-16: 20 mg via INTRAVENOUS
  Administered 2018-08-16 (×2): 10 mg via INTRAVENOUS
  Administered 2018-08-16 (×2): 20 mg via INTRAVENOUS
  Administered 2018-08-16: 10 mg via INTRAVENOUS

## 2018-08-16 MED ORDER — EPHEDRINE SULFATE 50 MG/ML IJ SOLN
INTRAMUSCULAR | Status: DC | PRN
  Administered 2018-08-16: 5 mg via INTRAVENOUS
  Administered 2018-08-16: 10 mg via INTRAVENOUS
  Administered 2018-08-16: 5 mg via INTRAVENOUS

## 2018-08-16 MED ORDER — OXYCODONE HCL 5 MG/5ML PO SOLN: 5.0000 mg | Freq: Once | ORAL | Status: DC | PRN

## 2018-08-16 MED ORDER — DEXMEDETOMIDINE HCL IN NACL 200 MCG/50ML IV SOLN
INTRAVENOUS | Status: AC
  Filled 2018-08-16: qty 50

## 2018-08-16 MED ORDER — DEXAMETHASONE SODIUM PHOSPHATE 10 MG/ML IJ SOLN
INTRAMUSCULAR | Status: DC | PRN
  Administered 2018-08-16: 4 mg via INTRAVENOUS

## 2018-08-16 MED ORDER — ONDANSETRON HCL 4 MG/2ML IJ SOLN
INTRAMUSCULAR | Status: DC | PRN
  Administered 2018-08-16 (×2): 4 mg via INTRAVENOUS

## 2018-08-16 MED ORDER — FENTANYL CITRATE (PF) 100 MCG/2ML IJ SOLN
25.0000 ug | INTRAMUSCULAR | Status: DC | PRN
  Administered 2018-08-16 (×2): 25 ug via INTRAVENOUS

## 2018-08-16 MED ORDER — HYDROMORPHONE HCL 1 MG/ML IJ SOLN
INTRAMUSCULAR | Status: AC
  Administered 2018-08-16: 0.25 mg via INTRAVENOUS
  Filled 2018-08-16: qty 1

## 2018-08-16 MED ORDER — PHENYLEPHRINE HCL 10 MG/ML IJ SOLN
INTRAMUSCULAR | Status: DC | PRN
  Administered 2018-08-16 (×3): 100 ug via INTRAVENOUS

## 2018-08-16 MED ORDER — OFLOXACIN 0.3 % OP SOLN: OPHTHALMIC | 5 refills | Status: DC

## 2018-08-16 MED ORDER — BACITRACIN 500 UNIT/GM EX OINT
TOPICAL_OINTMENT | CUTANEOUS | Status: DC | PRN
  Administered 2018-08-16: 1 via TOPICAL

## 2018-08-16 MED ORDER — CIPROFLOXACIN-DEXAMETHASONE 0.3-0.1 % OT SUSP
OTIC | Status: AC
  Filled 2018-08-16: qty 7.5

## 2018-08-16 MED ORDER — OFLOXACIN 0.3 % OP SOLN
OPHTHALMIC | Status: AC
  Filled 2018-08-16: qty 5

## 2018-08-16 MED ORDER — SUCCINYLCHOLINE CHLORIDE 20 MG/ML IJ SOLN
INTRAMUSCULAR | Status: AC
  Filled 2018-08-16: qty 1

## 2018-08-16 MED ORDER — ROCURONIUM BROMIDE 50 MG/5ML IV SOLN
INTRAVENOUS | Status: AC
  Filled 2018-08-16: qty 1

## 2018-08-16 MED ORDER — HYDROMORPHONE HCL 1 MG/ML IJ SOLN
0.2500 mg | INTRAMUSCULAR | Status: DC | PRN
  Administered 2018-08-16: 0.25 mg via INTRAVENOUS

## 2018-08-16 MED ORDER — HYDROCODONE-ACETAMINOPHEN 5-325 MG PO TABS
1.0000 | ORAL_TABLET | Freq: Once | ORAL | Status: AC
  Administered 2018-08-16: 1 via ORAL

## 2018-08-16 MED ORDER — FENTANYL CITRATE (PF) 100 MCG/2ML IJ SOLN
INTRAMUSCULAR | Status: AC
  Administered 2018-08-16: 25 ug via INTRAVENOUS
  Filled 2018-08-16: qty 2

## 2018-08-16 MED ORDER — PROPOFOL 10 MG/ML IV BOLUS
INTRAVENOUS | Status: AC
  Filled 2018-08-16: qty 20

## 2018-08-16 MED ORDER — LIDOCAINE HCL (CARDIAC) PF 100 MG/5ML IV SOSY
PREFILLED_SYRINGE | INTRAVENOUS | Status: DC | PRN
  Administered 2018-08-16: 100 mg via INTRAVENOUS

## 2018-08-16 MED ORDER — BACITRACIN ZINC 500 UNIT/GM EX OINT
TOPICAL_OINTMENT | CUTANEOUS | Status: AC
  Filled 2018-08-16: qty 28.35

## 2018-08-16 MED ORDER — PROMETHAZINE HCL 25 MG/ML IJ SOLN: 6.2500 mg | INTRAMUSCULAR | Status: DC | PRN

## 2018-08-16 MED ORDER — OFLOXACIN 0.3 % OT SOLN
OTIC | Status: DC | PRN
  Administered 2018-08-16: 5 [drp] via OTIC

## 2018-08-16 MED ORDER — EPINEPHRINE PF 1 MG/ML IJ SOLN
INTRAMUSCULAR | Status: AC
  Filled 2018-08-16: qty 1

## 2018-08-16 MED ORDER — FAMOTIDINE 20 MG PO TABS
ORAL_TABLET | ORAL | Status: AC
  Filled 2018-08-16: qty 1

## 2018-08-16 MED ORDER — SUCCINYLCHOLINE CHLORIDE 20 MG/ML IJ SOLN
INTRAMUSCULAR | Status: DC | PRN
  Administered 2018-08-16: 120 mg via INTRAVENOUS

## 2018-08-16 MED ORDER — SODIUM CHLORIDE 0.9 % IV SOLN
INTRAVENOUS | Status: DC | PRN
  Administered 2018-08-16: 20 ug/min via INTRAVENOUS

## 2018-08-16 MED ORDER — DEXMEDETOMIDINE HCL IN NACL 200 MCG/50ML IV SOLN
INTRAVENOUS | Status: DC | PRN
  Administered 2018-08-16 (×3): 8 ug via INTRAVENOUS
  Administered 2018-08-16: 4 ug via INTRAVENOUS

## 2018-08-16 MED ORDER — EPHEDRINE SULFATE 50 MG/ML IJ SOLN
INTRAMUSCULAR | Status: AC
  Filled 2018-08-16: qty 1

## 2018-08-16 MED ORDER — FENTANYL CITRATE (PF) 100 MCG/2ML IJ SOLN
INTRAMUSCULAR | Status: DC | PRN
  Administered 2018-08-16: 50 ug via INTRAVENOUS
  Administered 2018-08-16 (×4): 25 ug via INTRAVENOUS
  Administered 2018-08-16: 50 ug via INTRAVENOUS
  Administered 2018-08-16 (×4): 25 ug via INTRAVENOUS

## 2018-08-16 MED ORDER — LACTATED RINGERS IV SOLN
INTRAVENOUS | Status: DC
  Administered 2018-08-16 (×2): via INTRAVENOUS

## 2018-08-16 MED ORDER — CIPROFLOXACIN-DEXAMETHASONE 0.3-0.1 % OT SUSP
OTIC | Status: DC | PRN
  Administered 2018-08-16: 4 [drp] via OTIC

## 2018-08-16 MED ORDER — HYDROCODONE-ACETAMINOPHEN 5-325 MG PO TABS: 1.0000 | ORAL_TABLET | Freq: Four times a day (QID) | ORAL | 0 refills | Status: DC | PRN

## 2018-08-16 MED ORDER — ACETAMINOPHEN 10 MG/ML IV SOLN
INTRAVENOUS | Status: AC
  Filled 2018-08-16: qty 100

## 2018-08-16 MED ORDER — FAMOTIDINE 20 MG PO TABS
20.0000 mg | ORAL_TABLET | Freq: Once | ORAL | Status: AC
  Administered 2018-08-16: 20 mg via ORAL

## 2018-08-16 SURGICAL SUPPLY — 48 items
ADHESIVE MASTISOL STRL (MISCELLANEOUS) ×6 IMPLANT
BLADE EAR TYMPAN 2.5 60D BEAV (BLADE) IMPLANT
BLADE EAR TYMPAN 2.5 STR BEAV (BLADE) ×3 IMPLANT
BLADE MYR LANCE NRW W/HDL (BLADE) ×3 IMPLANT
BLADE SURG 15 STRL LF DISP TIS (BLADE) ×2 IMPLANT
BLADE SURG 15 STRL SS (BLADE) ×1
BUR ROUND FLUTED 5 RND (BURR) IMPLANT
BUR SABER DIAMOND 3.0 (BURR) ×3 IMPLANT
BUR SABER RD CUTTING 3.0 (BURR) ×3 IMPLANT
BUR SABER TAPERED DIAMOND 1 (BURR) ×3 IMPLANT
CANISTER SUCT 1200ML W/VALVE (MISCELLANEOUS) ×3 IMPLANT
CATH IV ANGIO 16GX3.25 GREY (CATHETERS) ×2 IMPLANT
COTTON BALL STRL MEDIUM (GAUZE/BANDAGES/DRESSINGS) ×3 IMPLANT
COVER WAND RF STERILE (DRAPES) ×3 IMPLANT
CUP MEDICINE 2OZ PLAST GRAD ST (MISCELLANEOUS) ×3 IMPLANT
DRAIN PENROSE 1/4X12 LTX (DRAIN) ×3 IMPLANT
DRAPE MICROSCOPE ZEISS 48X118 (DRAPES) ×3 IMPLANT
DRAPE SURG 17X11 SM STRL (DRAPES) ×6 IMPLANT
DRAPE SURG IRRIG POUCH 19X23 (DRAPES) ×3 IMPLANT
DRSG GLASSCOCK MASTOID ADT (GAUZE/BANDAGES/DRESSINGS) ×3 IMPLANT
DRSG TELFA 4X3 1S NADH ST (GAUZE/BANDAGES/DRESSINGS) ×3 IMPLANT
ELECT EMG 20MM DUAL (MISCELLANEOUS) ×6
ELECT REM PT RETURN 9FT ADLT (ELECTROSURGICAL) ×3
ELECTRODE EMG 20MM DUAL (MISCELLANEOUS) ×4 IMPLANT
ELECTRODE REM PT RTRN 9FT ADLT (ELECTROSURGICAL) ×2 IMPLANT
GLOVE BIO SURGEON STRL SZ7.5 (GLOVE) ×6 IMPLANT
GOWN STRL REUS W/ TWL LRG LVL3 (GOWN DISPOSABLE) ×4 IMPLANT
GOWN STRL REUS W/TWL LRG LVL3 (GOWN DISPOSABLE) ×2
IV CATH ANGIO 16GX3.25 GREY (CATHETERS) ×3
IV NS 500ML (IV SOLUTION) ×1
IV NS 500ML BAXH (IV SOLUTION) ×2 IMPLANT
NEEDLE FILTER BLUNT 18X 1/2SAF (NEEDLE) ×1
NEEDLE FILTER BLUNT 18X1 1/2 (NEEDLE) ×2 IMPLANT
NS IRRIG 500ML POUR BTL (IV SOLUTION) ×3 IMPLANT
PACK HEAD/NECK (MISCELLANEOUS) ×3 IMPLANT
SLEEVE PROTECTION STRL DISP (MISCELLANEOUS) ×6 IMPLANT
STOCKINETTE IMPERV 14X48 (MISCELLANEOUS) ×6 IMPLANT
SUT PLAIN GUT FAST 5-0 (SUTURE) ×3 IMPLANT
SUT VIC AB 4-0 RB1 27 (SUTURE) ×1
SUT VIC AB 4-0 RB1 27X BRD (SUTURE) ×2 IMPLANT
SYR 20CC LL (SYRINGE) ×3 IMPLANT
SYR 3ML LL SCALE MARK (SYRINGE) ×3 IMPLANT
TOWEL OR 17X26 4PK STRL BLUE (TOWEL DISPOSABLE) ×3 IMPLANT
TUBE EAR ARMSTRONG HC 1.14X3.5 (OTOLOGIC RELATED) IMPLANT
TUBE EAR T 1.27X4.5 GO LF (OTOLOGIC RELATED) ×3 IMPLANT
TUBING CONNECTING 10 (TUBING) ×3 IMPLANT
TUBING IRRIGATION (MISCELLANEOUS) ×3 IMPLANT
WIPE NON LINTING 3.25X3.25 (MISCELLANEOUS) IMPLANT

## 2018-08-16 NOTE — Addendum Note (Signed)
Addendum  created 08/16/18 1525 by Jovita Gamma, MD   Order list changed

## 2018-08-16 NOTE — H&P (Signed)
History and physical reviewed and will be scanned in later. No change in medical status reported by the patient or family, appears stable for surgery. All questions regarding the procedure answered, and patient (or family if a child) expressed understanding of the procedure. ? ?Beth Willis S Beth Willis ?@TODAY@ ?

## 2018-08-16 NOTE — Anesthesia Procedure Notes (Signed)
Procedure Name: Intubation Date/Time: 08/16/2018 7:38 AM Performed by: Ancil Boozer, RN Pre-anesthesia Checklist: Patient identified, Patient being monitored, Timeout performed, Emergency Drugs available and Suction available Patient Re-evaluated:Patient Re-evaluated prior to induction Oxygen Delivery Method: Circle system utilized Preoxygenation: Pre-oxygenation with 100% oxygen Induction Type: IV induction Ventilation: Mask ventilation without difficulty Laryngoscope Size: 2 and Miller Grade View: Grade I Tube type: Oral Tube size: 7.0 mm Number of attempts: 1 Airway Equipment and Method: Stylet Placement Confirmation: ETT inserted through vocal cords under direct vision,  positive ETCO2 and breath sounds checked- equal and bilateral Secured at: 21 cm Tube secured with: Tape Dental Injury: Teeth and Oropharynx as per pre-operative assessment

## 2018-08-16 NOTE — Discharge Instructions (Signed)

## 2018-08-16 NOTE — Anesthesia Preprocedure Evaluation (Signed)
Anesthesia Evaluation  Patient identified by MRN, date of birth, ID band Patient awake    Reviewed: Allergy & Precautions, NPO status , Patient's Chart, lab work & pertinent test results  History of Anesthesia Complications Negative for: history of anesthetic complications  Airway Mallampati: II  TM Distance: >3 FB Neck ROM: Full    Dental no notable dental hx.    Pulmonary asthma (mild intermittent) , Current Smoker,    breath sounds clear to auscultation- rhonchi (-) wheezing      Cardiovascular Exercise Tolerance: Good (-) hypertension(-) CAD, (-) Past MI, (-) Cardiac Stents and (-) CABG  Rhythm:Regular Rate:Normal - Systolic murmurs and - Diastolic murmurs    Neuro/Psych  Headaches, neg Seizures PSYCHIATRIC DISORDERS (ADD)    GI/Hepatic Neg liver ROS, GERD  ,  Endo/Other  negative endocrine ROSneg diabetes  Renal/GU negative Renal ROS     Musculoskeletal negative musculoskeletal ROS (+)   Abdominal (+) + obese,   Peds  Hematology  (+) anemia ,   Anesthesia Other Findings Past Medical History: No date: ADD (attention deficit disorder) No date: Anemia No date: Asthma     Comment:  WELL CONTROLLED No date: GERD (gastroesophageal reflux disease)     Comment:  OCC TUMS PRN No date: Headache     Comment:  MIGRAINES No date: History of substance abuse (HCC)   Reproductive/Obstetrics                             Anesthesia Physical Anesthesia Plan  ASA: II  Anesthesia Plan: General   Post-op Pain Management:    Induction: Intravenous  PONV Risk Score and Plan: 1 and Ondansetron and Midazolam  Airway Management Planned: Oral ETT  Additional Equipment:   Intra-op Plan:   Post-operative Plan: Extubation in OR  Informed Consent: I have reviewed the patients History and Physical, chart, labs and discussed the procedure including the risks, benefits and alternatives for the  proposed anesthesia with the patient or authorized representative who has indicated his/her understanding and acceptance.     Dental advisory given  Plan Discussed with: CRNA and Anesthesiologist  Anesthesia Plan Comments:         Anesthesia Quick Evaluation

## 2018-08-16 NOTE — Anesthesia Postprocedure Evaluation (Signed)
Anesthesia Post Note  Patient: Advice worker  Procedure(s) Performed: TYMPANOMASTOIDECTOMY WITH POSSIBLE OSSICULAR GRAFT (Right ) MYRINGOTOMY WITH TUBE PLACEMENT (Left )  Patient location during evaluation: PACU Anesthesia Type: General Level of consciousness: awake and alert and oriented Pain management: pain level controlled Vital Signs Assessment: post-procedure vital signs reviewed and stable Respiratory status: spontaneous breathing, nonlabored ventilation and respiratory function stable Cardiovascular status: blood pressure returned to baseline and stable Postop Assessment: no signs of nausea or vomiting Anesthetic complications: no     Last Vitals:  Vitals:   08/16/18 1350 08/16/18 1404  BP: 127/68 106/60  Pulse: (!) 107 (!) 102  Resp: 14 16  Temp: (!) 36.1 C (!) 36 C  SpO2: 96% 98%    Last Pain:  Vitals:   08/16/18 1404  TempSrc: Temporal  PainSc: 7                  Mylen Mangan

## 2018-08-16 NOTE — Op Note (Signed)
08/16/2018  12:34 PM    Beth Willis  [id number]   Pre-Op Diagnosis:  CHOLESTEATOMA, RIGHT EAR, BILATERAL EUSTACHIAN TUBE DYSFUNCTION  Post-op Diagnosis: SAME  Procedure:   RIGHT TYMPANOMASTOIDECTOMY WITH OSSICULAR CHAIN RECONSTRUCTION, LEFT MYRINGOTOMY WITH T-TUBE PLACEMENT  Surgeon:  Sandi Mealy  Anesthesia:  General endotracheal  EBL:  Less than 25 cc  Complications:  None  Findings: Deep retraction pocket involving the posterior superior aspect of the right tympanic membrane with granulation tissue and cholesteatoma in the attic and oval window region.  The incus was eroded except for the body of the incus.  The stapes was partially eroded though still intact.  The ossicular chain was reconstructed with an incus transposition graft.  A T-tube was placed in the left tympanic membrane.  There was scant clear mucus in the left middle ear.  Procedure: The patient was taken to the Operating Room and placed in the supine position. After induction of general endotracheal anesthesia, a time-out was performed confirming the consented procedure and site and the left ear was evaluated on the operating microscope and cleared of scant cerumen.  An anterior inferior myringotomy was performed and a T-tube placed and suction for patency.  Ciprodex drops were applied.  The patient was then turned 90 degrees. , 1% lidocaine with epinephrine 1: 100,000 was injected into the right postauricular region and, under visualization with the operating microscope, into the canal skin in the posterior and inferior canal.  The facial nerve monitor electrodes were placed in the usual fashion monitoring the right lower lip and right brow.  The  right ear was then prepped and draped in the usual sterile fashion. The ear was evaluated under the operating microscope through an ear speculum, and the canal cleaned and irrigated with saline, and the perforation inspected. The findings were as described above. Vertical  canal incisions were then made at 12:00 and 6:00, followed by a horizontal incision approximately 35mm posterior to the annulus, more closely approaching the annulus in the region of the retraction pocket to preserve canal skin as much as possible.  An incision was made with a 15 blade just behind the post-auricular crease below the hairline. The incision was carried down through the subcutaneous tissues with the Bovie, and dissection carried down to the temporalis fascia. Scissors were used to harvest an adequately sized fascia graft, which was set aside, pressed and dried for later use. Next dissection proceeded down into the canal, elevating the posterior canal vascular strip until the previously made canal cuts were encountered. The ear was then retracted forward with a Penrose drain through the canal, and a self retainer.   With a noted deep retraction pocket, and partial opacification of the mastoid antrum on CT, the depth of the cholesteatoma was uncertain.  A #5 cutting bur was used to perform a cortical mastoidectomy carefully taken down the bone preserving bone over the tegmen and sigmoid sinus region as well as the posterior canal bone.  Dissection proceeded until the antrum was entered.  The mastoid itself was quite dense with very little air-containing spaces until the antrum was encountered.  There was no gross cholesteatoma visualized in the antrum, just mucosal thickening.  Attention was then turned to the tympanic membrane which was severely retracted posteriorly.  A tympanomeatal flap was carefully elevated, initially down to the annulus which was elevated using a Rosen needle, entering the middle ear inferiorly.  Dissection then proceeded more superiorly, carefully pulling the retracted tympanic membrane out of the  facial recess during the dissection.  There was quite a bit of granulation tissue more superiorly.  The chorda tympani nerve was completely engulfed in retracted tympanic membrane  and granulation tissue.  This had to be divided for adequate resection of the cholesteatoma.  A #3 diamond bur was used to perform an atticotomy to better visualize this region during dissection.  The granulation tissue and cholesteatoma then carefully dissected out of the attic as well as the region around the stapes.  The stapedius tendon was identified and disease resected from around this area and off of the stapes itself, which had collapsed tympanic membrane attached to it.  Care was taken to make sure all epithelium was removed.  Superiorly the remnant of the incus was separated from the head of the malleus with the right angle pick and removed and set aside for grafting.  The cholesteatoma was then further dissected out and found to be extending somewhat around the neck of the malleus.  Malleus nippers were used to divide the neck of the malleus and remove the head of the malleus which facilitated complete removal of the cholesteatoma from the attic.  Care was taken over the facial nerve region which was felt to be potentially dehiscent preoperatively.  Granulation tissue was carefully dissected away from this region atraumatically.  The final area was in the oval window niche which had a retracted portion of tympanic membrane adherent to this region.  This was carefully dissected out making sure all epithelium was removed.  The ear was irrigated with saline and inspected.  The stapes was noted to have good mobility.  The incus remnant was then fashioned into a graft that would fit over the head of the stapes, and just under the malleus.  To facilitate positioning, the tensor tympani tendon was cut to allow some lateralization of the malleus.  Cartilage was harvested from the conchal cartilage to help close the attic defect.  The fascial graft was then trimmed and placed into position under the perforation. Floxin moistened Gelfoam was used to pack the middle ear, supporting the graft against the  undersurface of the tympanic membrane anteriorly.   The incus transposition graft was placed into position, fitting nicely over the head of the stapes, and further supported with Gelfoam.  Next the cartilage graft was placed to reconstruct the attic defect.  The tympanomeatal flap was then placed back into anatomic position, and good placement of the graft confirmed. Hemostasis was obtained and the post-auricular wound closed with 4-0 Vicryl suture for the subcutaneous closure, followed by 5-0 fast absorbing gut suture in a running locked stitch for the skin.  The canal was then packed with Floxin moistened Gelfoam after everting the posterior canal flap and placing it in anatomic position.  Bacitracin ointment was applied to the postauricular incision. A cotton ball was placed at the external meatus, and a Glasscock dressing applied to the ear.   The patient was then returned to the anesthesiologist for awakening, and was taken to the Recovery Room in stable condition.  Disposition:   PACU then discharge home  Plan: Remove the ear dressing as instructed, then start Floxin ear drops as prescribed and water precautions.  Antibiotic ointment to the postauricular wound. Follow-up at my office as scheduled.  Sandi Mealy 08/16/2018 12:34 PM

## 2018-08-16 NOTE — Transfer of Care (Signed)
Immediate Anesthesia Transfer of Care Note  Patient: Beth Willis  Procedure(s) Performed: TYMPANOMASTOIDECTOMY WITH POSSIBLE OSSICULAR GRAFT (Right ) MYRINGOTOMY WITH TUBE PLACEMENT (Left )  Patient Location: PACU  Anesthesia Type:General  Level of Consciousness: awake and alert   Airway & Oxygen Therapy: Patient connected to face mask oxygen  Post-op Assessment: Post -op Vital signs reviewed and stable  Post vital signs: stable  Last Vitals:  Vitals Value Taken Time  BP 113/64 08/16/2018 12:43 PM  Temp 36.6 C 08/16/2018 12:43 PM  Pulse 129 08/16/2018 12:50 PM  Resp 16 08/16/2018 12:50 PM  SpO2 99 % 08/16/2018 12:50 PM  Vitals shown include unvalidated device data.  Last Pain:  Vitals:   08/16/18 0611  TempSrc: Oral  PainSc: 3          Complications: No apparent anesthesia complications

## 2018-08-16 NOTE — Anesthesia Post-op Follow-up Note (Signed)
Anesthesia QCDR form completed.        

## 2018-08-17 ENCOUNTER — Encounter: Payer: Self-pay | Admitting: Otolaryngology

## 2018-08-17 LAB — SURGICAL PATHOLOGY

## 2019-03-19 ENCOUNTER — Emergency Department
Admission: EM | Admit: 2019-03-19 | Discharge: 2019-03-20 | Disposition: A | Discharge status: Home / Self Care | Payer: Managed Care, Other (non HMO) | Attending: Emergency Medicine | Admitting: Emergency Medicine

## 2019-03-19 ENCOUNTER — Other Ambulatory Visit: Payer: Self-pay

## 2019-03-19 DIAGNOSIS — J45909 Unspecified asthma, uncomplicated: Secondary | ICD-10-CM | POA: Diagnosis not present

## 2019-03-19 DIAGNOSIS — H6690 Otitis media, unspecified, unspecified ear: Secondary | ICD-10-CM | POA: Diagnosis not present

## 2019-03-19 DIAGNOSIS — H60501 Unspecified acute noninfective otitis externa, right ear: Secondary | ICD-10-CM | POA: Diagnosis not present

## 2019-03-19 DIAGNOSIS — F1210 Cannabis abuse, uncomplicated: Secondary | ICD-10-CM | POA: Insufficient documentation

## 2019-03-19 DIAGNOSIS — H9201 Otalgia, right ear: Secondary | ICD-10-CM | POA: Diagnosis present

## 2019-03-19 DIAGNOSIS — F17210 Nicotine dependence, cigarettes, uncomplicated: Secondary | ICD-10-CM | POA: Diagnosis not present

## 2019-03-19 NOTE — ED Triage Notes (Signed)
Pt in with co right earache that started today with some bleeding. Denies any injury, did have ear surgery to same ear 6 months ago for "ear drum reconstruction, and tumor removal". Had check up 3 weeks ago and states everything was wnl.

## 2019-03-20 MED ORDER — CIPROFLOXACIN-DEXAMETHASONE 0.3-0.1 % OT SUSP
4.0000 [drp] | Freq: Once | OTIC | Status: AC
  Administered 2019-03-20: 4 [drp] via OTIC
  Filled 2019-03-20: qty 7.5

## 2019-03-20 MED ORDER — CIPROFLOXACIN-DEXAMETHASONE 0.3-0.1 % OT SUSP: 4.0000 [drp] | Freq: Two times a day (BID) | OTIC | 0 refills | Status: AC

## 2019-03-20 MED ORDER — CLINDAMYCIN HCL 300 MG PO CAPS: 300.0000 mg | ORAL_CAPSULE | Freq: Three times a day (TID) | ORAL | 0 refills | Status: AC

## 2019-03-20 MED ORDER — CLINDAMYCIN HCL 150 MG PO CAPS
300.0000 mg | ORAL_CAPSULE | Freq: Once | ORAL | Status: AC
  Administered 2019-03-20: 300 mg via ORAL
  Filled 2019-03-20 (×2): qty 2

## 2019-03-20 MED ORDER — OXYCODONE-ACETAMINOPHEN 5-325 MG PO TABS
1.0000 | ORAL_TABLET | Freq: Once | ORAL | Status: AC
  Administered 2019-03-20: 1 via ORAL
  Filled 2019-03-20: qty 1

## 2019-03-20 MED ORDER — OXYCODONE-ACETAMINOPHEN 5-325 MG PO TABS: 1.0000 | ORAL_TABLET | ORAL | 0 refills | Status: DC | PRN
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
## 2019-04-30 ENCOUNTER — Other Ambulatory Visit: Payer: Self-pay

## 2019-04-30 ENCOUNTER — Encounter: Payer: Self-pay | Admitting: Podiatry

## 2019-04-30 ENCOUNTER — Other Ambulatory Visit: Payer: Self-pay | Admitting: Podiatry

## 2019-04-30 ENCOUNTER — Ambulatory Visit (INDEPENDENT_AMBULATORY_CARE_PROVIDER_SITE_OTHER): Payer: Managed Care, Other (non HMO) | Admitting: Podiatry

## 2019-04-30 ENCOUNTER — Ambulatory Visit (INDEPENDENT_AMBULATORY_CARE_PROVIDER_SITE_OTHER): Payer: Managed Care, Other (non HMO)

## 2019-04-30 DIAGNOSIS — M722 Plantar fascial fibromatosis: Secondary | ICD-10-CM

## 2019-04-30 DIAGNOSIS — M79672 Pain in left foot: Secondary | ICD-10-CM | POA: Diagnosis not present

## 2019-04-30 DIAGNOSIS — M7732 Calcaneal spur, left foot: Secondary | ICD-10-CM

## 2019-04-30 MED ORDER — METHYLPREDNISOLONE 4 MG PO TBPK: ORAL_TABLET | ORAL | 0 refills | Status: DC

## 2019-04-30 NOTE — Progress Notes (Signed)
Subjective:  Patient ID: Beth Willis, female    DOB: 1995-08-03,  MRN: [id number]  Chief Complaint  Patient presents with  . Heel Spurs    Patient presents today for left heel pain x 6 weeks.  She was seen at [facility] 1 month ago and diagnosed with heel spur.  She states "its very painful,to walk and constantly throbs"    23 y.o. female presents with the above complaint.  Patient states that this pain has been unbearable.  She has tried stretching it according to urgent med.  She denies any other acute complaints.  She has not been given an injection.  She has not seen other previous podiatrist.  She has postoperative dyskinesia and pain along the rest of the day.  Her pain is worse when she ambulates and gets up from the bed.  She ambulates in [employer].   Review of Systems: Negative except as noted in the HPI. Denies N/V/F/Ch.  Past Medical History:  Diagnosis Date  . ADD (attention deficit disorder)   . Anemia   . Asthma    WELL CONTROLLED  . GERD (gastroesophageal reflux disease)    OCC TUMS PRN  . Headache    MIGRAINES  . History of substance abuse ([facility])     Current Outpatient Medications:  .  ibuprofen (ADVIL) 600 MG tablet, Take by mouth., Disp: , Rfl:  .  ferrous sulfate 325 (65 FE) MG tablet, TAKE 1 TABLET BY MOUTH EVERY DAY WITH BREAKFAST (Patient not taking: Reported on 08/02/2018), Disp: 60 tablet, Rfl: 1 .  fluticasone (FLONASE) 50 MCG/ACT nasal spray, Place 2 sprays into both nostrils daily., Disp: , Rfl:  .  levonorgestrel (MIRENA) 20 MCG/24HR IUD, 1 each by Intrauterine route once., Disp: , Rfl:  .  montelukast (SINGULAIR) 10 MG tablet, Take 10 mg by mouth daily., Disp: , Rfl:   Social History   Tobacco Use  Smoking Status Current Every Day Smoker  . Packs/day: 0.25  . Years: 10.00  . Pack years: 2.50  . Types: Cigarettes  Smokeless Tobacco Never Used    Allergies  Allergen Reactions  . Bee Pollen Other (See Comments)  . Penicillins Hives   Did it involve swelling of the face/tongue/throat, SOB, or low BP? Unknown Did it involve sudden or severe rash/hives, skin peeling, or any reaction on the inside of your mouth or nose? No Did you need to seek medical attention at a hospital or doctor's office? No When did it last happen?Childhood allergy  . Pollen Extract   . Amoxicillin Hives    Did it involve swelling of the face/tongue/throat, SOB, or low BP? Unknown Did it involve sudden or severe rash/hives, skin peeling, or any reaction on the inside of your mouth or nose? No Did you need to seek medical attention at a hospital or doctor's office? No When did it last happen?Childhood allergy If all above answers are "NO", may proceed with cephalosporin use.    Objective:  There were no vitals filed for this visit. There is no height or weight on file to calculate BMI. Constitutional Well developed. Well nourished.  Vascular Dorsalis pedis pulses palpable bilaterally. Posterior tibial pulses palpable bilaterally. Capillary refill normal to all digits.  No cyanosis or clubbing noted. Pedal hair growth normal.  Neurologic Normal speech. Oriented to person, place, and time. Epicritic sensation to light touch grossly present bilaterally.  Dermatologic Nails well groomed and normal in appearance. No open wounds. No skin lesions.  Orthopedic: Normal joint ROM  without pain or crepitus bilaterally. No visible deformities. Tender to palpation at the calcaneal tuber left. No pain with calcaneal squeeze left. Ankle ROM diminished range of motion left. Silfverskiold Test: positive left.   Radiographs: Taken and reviewed. No acute fractures or dislocations. No evidence of stress fracture.  Plantar heel spur present. Posterior heel spur present.   Assessment:   1. Plantar fasciitis    Plan:  Patient was evaluated and treated and all questions answered.  Plantar Fasciitis, left - XR reviewed as above.  - Educated on  icing and stretching. Instructions given.  - Injection delivered to the plantar fascia as below. - DME: Plantar Fascial Brace - Pharmacologic management: Medrol Dose Pak. Educated on risks/benefits and proper taking of medication. -I spoke to her about over-the-counter orthotics with new supportive shoe gear changes.  Procedure: Injection Tendon/Ligament Location: Left plantar fascia at the glabrous junction; medial approach. Skin Prep: alcohol Injectate: 0.5 cc 0.5% marcaine plain, 0.5 cc of 1% Lidocaine, 0.5 cc kenalog 10. Disposition: Patient tolerated procedure well. Injection site dressed with a band-aid.  No follow-ups on file.
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
## 2019-05-10 ENCOUNTER — Ambulatory Visit (INDEPENDENT_AMBULATORY_CARE_PROVIDER_SITE_OTHER): Payer: Managed Care, Other (non HMO) | Admitting: Obstetrics and Gynecology

## 2019-05-10 ENCOUNTER — Other Ambulatory Visit: Payer: Self-pay

## 2019-05-10 ENCOUNTER — Encounter: Payer: Self-pay | Admitting: Obstetrics and Gynecology

## 2019-05-10 VITALS — BP 110/90 | Ht 67.0 in | Wt 203.0 lb

## 2019-05-10 DIAGNOSIS — F419 Anxiety disorder, unspecified: Secondary | ICD-10-CM | POA: Diagnosis not present

## 2019-05-10 DIAGNOSIS — D509 Iron deficiency anemia, unspecified: Secondary | ICD-10-CM | POA: Diagnosis not present

## 2019-05-10 DIAGNOSIS — N3000 Acute cystitis without hematuria: Secondary | ICD-10-CM | POA: Diagnosis not present

## 2019-05-10 DIAGNOSIS — F32A Depression, unspecified: Secondary | ICD-10-CM

## 2019-05-10 DIAGNOSIS — F329 Major depressive disorder, single episode, unspecified: Secondary | ICD-10-CM | POA: Diagnosis not present

## 2019-05-10 HISTORY — DX: Depression, unspecified: F32.A

## 2019-05-10 LAB — POCT URINALYSIS DIPSTICK
Bilirubin, UA: NEGATIVE
Blood, UA: NEGATIVE
Glucose, UA: NEGATIVE
Ketones, UA: NEGATIVE
Nitrite, UA: NEGATIVE
Protein, UA: NEGATIVE
Spec Grav, UA: 1.025 (ref 1.010–1.025)
pH, UA: 5 (ref 5.0–8.0)

## 2019-05-10 LAB — POCT HEMOGLOBIN: Hemoglobin: 14 g/dL (ref 11–14.6)

## 2019-05-10 MED ORDER — SERTRALINE HCL 50 MG PO TABS: ORAL_TABLET | ORAL | 1 refills | Status: DC

## 2019-05-10 MED ORDER — NITROFURANTOIN MONOHYD MACRO 100 MG PO CAPS: 100.0000 mg | ORAL_CAPSULE | Freq: Two times a day (BID) | ORAL | 0 refills | Status: AC

## 2019-05-10 NOTE — Patient Instructions (Signed)
I value your feedback and entrusting us with your care. If you get a [facility] patient survey, I would appreciate you taking the time to let us know about your experience today. Thank you! 

## 2019-05-13 ENCOUNTER — Other Ambulatory Visit: Payer: Self-pay | Admitting: Obstetrics and Gynecology

## 2019-05-13 LAB — URINE CULTURE

## 2019-05-16 ENCOUNTER — Other Ambulatory Visit: Payer: Self-pay | Admitting: Obstetrics and Gynecology

## 2019-05-16 ENCOUNTER — Encounter: Payer: Self-pay | Admitting: Obstetrics and Gynecology

## 2019-05-16 MED ORDER — CIPROFLOXACIN HCL 500 MG PO TABS: 500.0000 mg | ORAL_TABLET | Freq: Two times a day (BID) | ORAL | 0 refills | Status: AC

## 2019-05-16 NOTE — Progress Notes (Signed)
Rx cipro for persistent UTI sx after pos C&S.

## 2019-05-28 ENCOUNTER — Other Ambulatory Visit: Payer: Self-pay

## 2019-05-28 ENCOUNTER — Encounter: Payer: Self-pay | Admitting: Podiatry

## 2019-05-28 ENCOUNTER — Ambulatory Visit (INDEPENDENT_AMBULATORY_CARE_PROVIDER_SITE_OTHER): Payer: Managed Care, Other (non HMO) | Admitting: Podiatry

## 2019-05-28 DIAGNOSIS — M722 Plantar fascial fibromatosis: Secondary | ICD-10-CM

## 2019-05-28 DIAGNOSIS — M216X2 Other acquired deformities of left foot: Secondary | ICD-10-CM

## 2019-05-28 DIAGNOSIS — M79672 Pain in left foot: Secondary | ICD-10-CM | POA: Diagnosis not present

## 2019-05-28 DIAGNOSIS — M2141 Flat foot [pes planus] (acquired), right foot: Secondary | ICD-10-CM

## 2019-05-28 DIAGNOSIS — M7732 Calcaneal spur, left foot: Secondary | ICD-10-CM | POA: Diagnosis not present

## 2019-05-28 DIAGNOSIS — M2142 Flat foot [pes planus] (acquired), left foot: Secondary | ICD-10-CM

## 2019-05-28 NOTE — Progress Notes (Addendum)
Subjective:  Patient ID: Beth Willis, female    DOB: 1996/05/20,  MRN: [id number]  Chief Complaint  Patient presents with  . Plantar Fasciitis    follow up plantar fasciitis left foot   "its doing much better"    23 y.o. female presents with the above complaint.  Patient states that she is doing really well.  Her pain has improved greater than 80%.  She states that she still has small occasional pain however much more tolerable.  She has been doing h her stretching exercises and wearing her brace.  She denies any nausea fever chills vomiting.  She is also wanting to discuss custom-made orthotics.   Review of Systems: Negative except as noted in the HPI. Denies N/V/F/Ch.  Past Medical History:  Diagnosis Date  . ADD (attention deficit disorder)   . Anemia   . Anxiety and depression 05/10/2019  . Asthma    WELL CONTROLLED  . GERD (gastroesophageal reflux disease)    OCC TUMS PRN  . Headache    MIGRAINES  . History of substance abuse ([place])     Current Outpatient Medications:  .  ferrous sulfate 325 (65 FE) MG tablet, TAKE 1 TABLET BY MOUTH EVERY DAY WITH BREAKFAST, Disp: 60 tablet, Rfl: 1 .  fluticasone (FLONASE) 50 MCG/ACT nasal spray, Place 2 sprays into both nostrils daily., Disp: , Rfl:  .  levonorgestrel (MIRENA) 20 MCG/24HR IUD, 1 each by Intrauterine route once., Disp: , Rfl:  .  montelukast (SINGULAIR) 10 MG tablet, Take 10 mg by mouth daily., Disp: , Rfl:  .  sertraline (ZOLOFT) 50 MG tablet, Take 1/2 tab daily for 6 days, then 1 tab daily, Disp: 30 tablet, Rfl: 1  Social History   Tobacco Use  Smoking Status Current Every Day Smoker  . Packs/day: 0.25  . Years: 10.00  . Pack years: 2.50  . Types: Cigarettes  Smokeless Tobacco Never Used    Allergies  Allergen Reactions  . Dust Mite Extract Shortness Of Breath and Swelling  . Bee Pollen Other (See Comments)  . Penicillins Hives    Did it involve swelling of the face/tongue/throat, SOB, or low BP? Unknown  Did it involve sudden or severe rash/hives, skin peeling, or any reaction on the inside of your mouth or nose? No Did you need to seek medical attention at a hospital or doctor's office? No When did it last happen?Childhood allergy  . Pollen Extract   . Amoxicillin Hives    Did it involve swelling of the face/tongue/throat, SOB, or low BP? Unknown Did it involve sudden or severe rash/hives, skin peeling, or any reaction on the inside of your mouth or nose? No Did you need to seek medical attention at a hospital or doctor's office? No When did it last happen?Childhood allergy If all above answers are "NO", may proceed with cephalosporin use.    Objective:  There were no vitals filed for this visit. There is no height or weight on file to calculate BMI. Constitutional Well developed. Well nourished.  Vascular Dorsalis pedis pulses palpable bilaterally. Posterior tibial pulses palpable bilaterally. Capillary refill normal to all digits.  No cyanosis or clubbing noted. Pedal hair growth normal.  Neurologic Normal speech. Oriented to person, place, and time. Epicritic sensation to light touch grossly present bilaterally.  Dermatologic Nails well groomed and normal in appearance. No open wounds. No skin lesions.  Orthopedic: Normal joint ROM without pain or crepitus bilaterally. No visible deformities. Tender to palpation at the calcaneal tuber left.  No pain with calcaneal squeeze left. Ankle ROM diminished range of motion left. Silfverskiold Test: positive left.   Radiographs: None  Assessment:   1. Pain in left foot   2. Plantar fasciitis   3. Heel spur, left   4. Pes planus of both feet   5. Other acquired deformities of left foot    Plan:  Patient was evaluated and treated and all questions answered.  Plantar Fasciitis, left - XR reviewed as above.  - Educated on icing and stretching. Instructions given.  - Injection delivered to the plantar fascia as  below. - DME: Night splint - Pharmacologic management: Medrol Dose Pak. Educated on risks/benefits and proper taking of medication.  Pes planus flexible with hammertoe contractures/other deformities of the foot -I explained to her the etiology and various treatment options available for the deformity.  I explained to her that it is a primary cause of plantar fasciitis in her foot. -I spoke to her about arch support for plantar fasciitis as well as to address her flexible pes planus deformity.  I believe this will help in the long-term management of plantar fasciitis.  Someone from the office will reach out to her to discuss orthotics.  Procedure: Injection Tendon/Ligament Location: Left plantar fascia at the glabrous junction; medial approach. Skin Prep: alcohol Injectate: 0.5 cc 0.5% marcaine plain, 0.5 cc of 1% Lidocaine, 0.5 cc kenalog 10. Disposition: Patient tolerated procedure well. Injection site dressed with a band-aid.  Return if symptoms worsen or fail to improve.

## 2019-06-30 ENCOUNTER — Other Ambulatory Visit: Payer: Self-pay | Admitting: Obstetrics and Gynecology

## 2019-06-30 DIAGNOSIS — F419 Anxiety disorder, unspecified: Secondary | ICD-10-CM

## 2019-06-30 DIAGNOSIS — F329 Major depressive disorder, single episode, unspecified: Secondary | ICD-10-CM

## 2019-06-30 DIAGNOSIS — F32A Depression, unspecified: Secondary | ICD-10-CM
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
## 2019-07-02 ENCOUNTER — Encounter: Payer: Self-pay | Admitting: Obstetrics and Gynecology

## 2019-07-02 ENCOUNTER — Other Ambulatory Visit: Payer: Self-pay

## 2019-07-02 ENCOUNTER — Ambulatory Visit (INDEPENDENT_AMBULATORY_CARE_PROVIDER_SITE_OTHER): Payer: Managed Care, Other (non HMO) | Admitting: Obstetrics and Gynecology

## 2019-07-02 VITALS — BP 124/82 | HR 97 | Ht 66.0 in | Wt 206.0 lb

## 2019-07-02 DIAGNOSIS — Z1329 Encounter for screening for other suspected endocrine disorder: Secondary | ICD-10-CM | POA: Diagnosis not present

## 2019-07-02 DIAGNOSIS — F419 Anxiety disorder, unspecified: Secondary | ICD-10-CM

## 2019-07-02 DIAGNOSIS — R6889 Other general symptoms and signs: Secondary | ICD-10-CM | POA: Diagnosis not present

## 2019-07-02 DIAGNOSIS — F329 Major depressive disorder, single episode, unspecified: Secondary | ICD-10-CM

## 2019-07-02 DIAGNOSIS — F32A Depression, unspecified: Secondary | ICD-10-CM

## 2019-07-02 MED ORDER — SERTRALINE HCL 100 MG PO TABS: 100.0000 mg | ORAL_TABLET | Freq: Every day | ORAL | 0 refills | Status: DC

## 2019-07-02 NOTE — Patient Instructions (Signed)
I value your feedback and entrusting us with your care. If you get a [facility] patient survey, I would appreciate you taking the time to let us know about your experience today. Thank you!  As of June 07, 2019, your lab results will be released to your MyChart immediately, before I even have a chance to see them. Please give me time to review them and contact you if there are any abnormalities. Thank you for your patience.  

## 2019-07-03 LAB — COMPREHENSIVE METABOLIC PANEL
ALT: 13 IU/L (ref 0–32)
AST: 13 IU/L (ref 0–40)
Albumin/Globulin Ratio: 2.5 — ABNORMAL HIGH (ref 1.2–2.2)
Albumin: 4.7 g/dL (ref 3.9–5.0)
Alkaline Phosphatase: 84 IU/L (ref 39–117)
BUN/Creatinine Ratio: 8 — ABNORMAL LOW (ref 9–23)
BUN: 7 mg/dL (ref 6–20)
Bilirubin Total: 0.3 mg/dL (ref 0.0–1.2)
CO2: 24 mmol/L (ref 20–29)
Calcium: 9.3 mg/dL (ref 8.7–10.2)
Chloride: 104 mmol/L (ref 96–106)
Creatinine, Ser: 0.87 mg/dL (ref 0.57–1.00)
GFR calc Af Amer: 108 mL/min/{1.73_m2} (ref >59)
GFR calc non Af Amer: 94 mL/min/{1.73_m2} (ref >59)
Globulin, Total: 1.9 g/dL (ref 1.5–4.5)
Glucose: 95 mg/dL (ref 65–99)
Potassium: 4.3 mmol/L (ref 3.5–5.2)
Sodium: 138 mmol/L (ref 134–144)
Total Protein: 6.6 g/dL (ref 6.0–8.5)

## 2019-07-03 LAB — TSH+FREE T4
Free T4: 1.06 ng/dL (ref 0.82–1.77)
TSH: 2.44 u[IU]/mL (ref 0.450–4.500)

## 2019-07-25 IMAGING — MR MR PELVIS W/O CM
6 of 8 series · 29 of 48 positions shown · non-contrast
Comparison: None.

CLINICAL DATA: Thirty-six weeks pregnant, nausea/vomiting,
leukocytosis, abdominal pain. Evaluate for cholelithiasis and
appendicitis.

EXAM:
MRI ABDOMEN AND PELVIS WITHOUT CONTRAST
TECHNIQUE: Multiplanar multisequence MR imaging of the abdomen and pelvis was
performed. No intravenous contrast was administered.

[Series 3: cor haste · coronal · 6.0mm · 1.25mm/px · 4 of 39 slices shown]
[im 1/39]
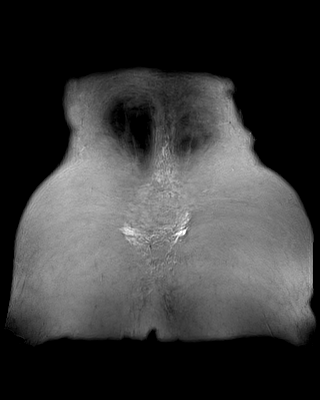
[im 13/39]
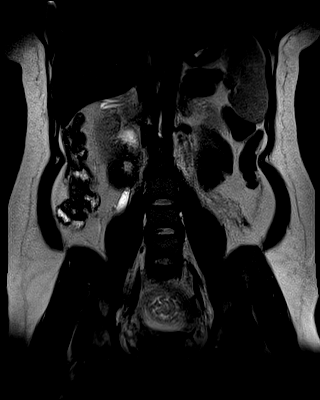
[im 26/39]
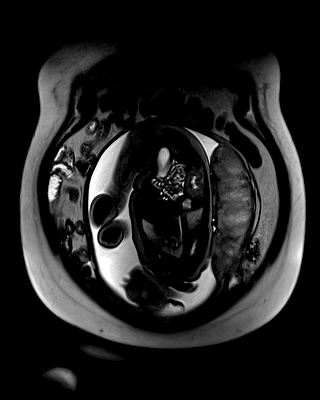
[im 39/39]
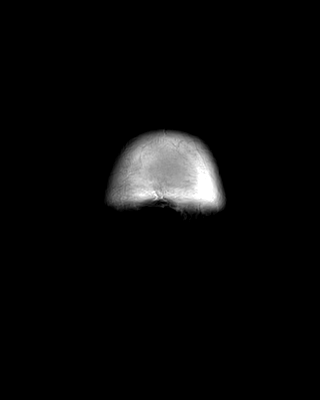

[Series 4: cor haste fs · coronal · 6.0mm · 1.25mm/px · 3 of 39 slices shown]
[im 1/39]
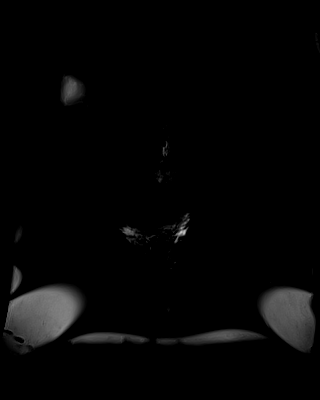
[im 20/39]
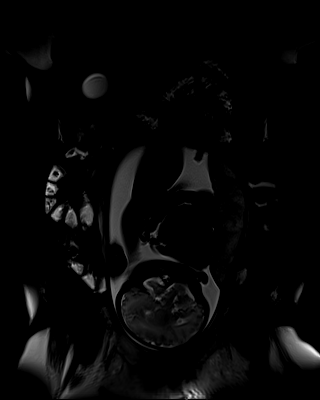
[im 39/39]
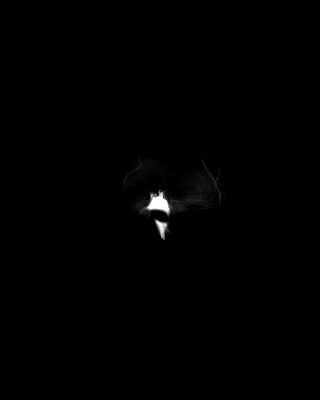

[Series 5: bSSFP · coronal · 6.0mm · 0.62mm/px · 3 of 39 slices shown (1 of 2)]
[im 1/39]
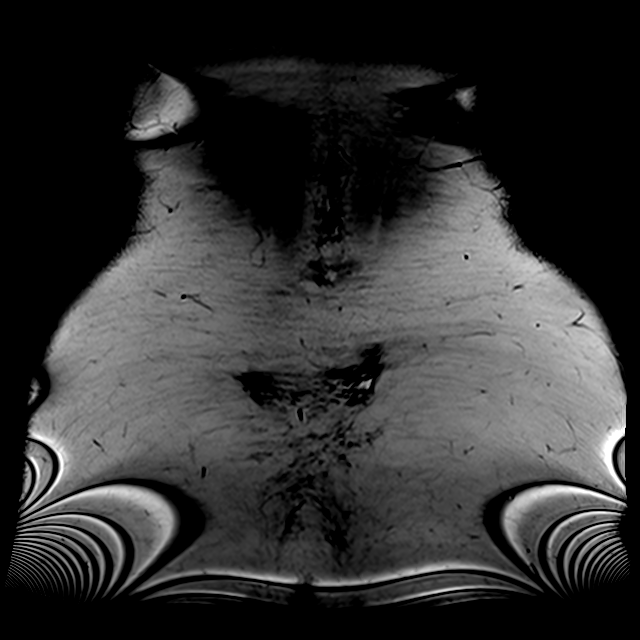
[im 20/39]
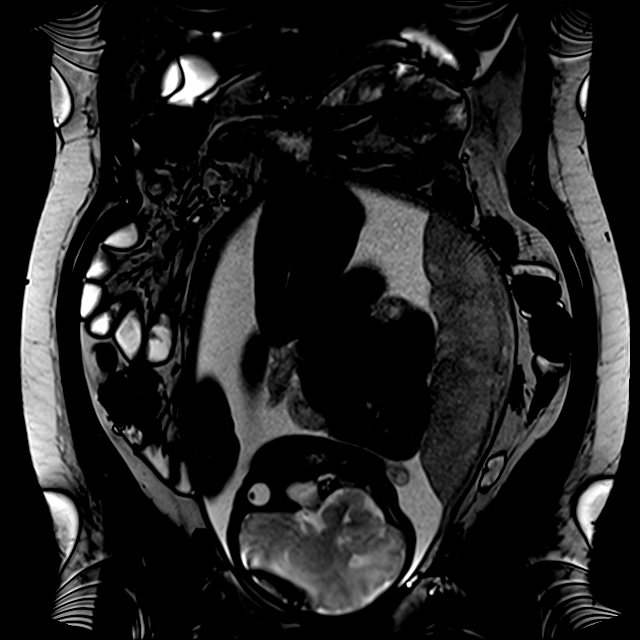
[im 39/39]
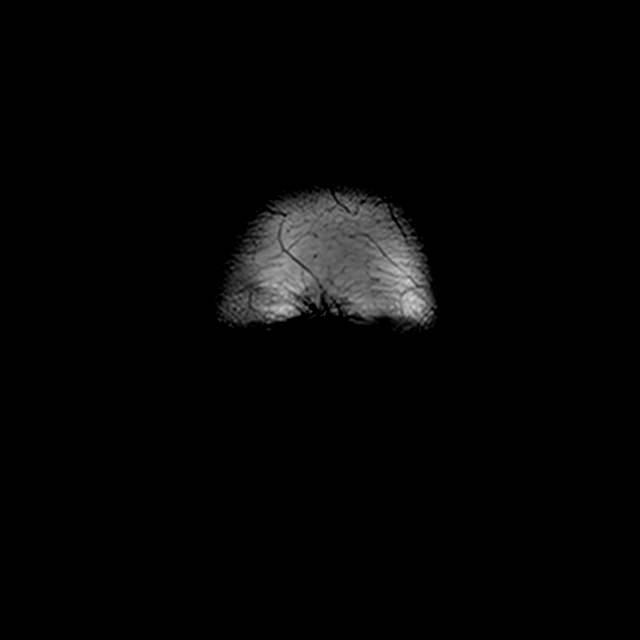

[Series 8: axial haste_comp · axial · 5.0mm · 1.09mm/px · z∈[-279,+255]mm · 8 of 90 slices shown]
[im 1/90]
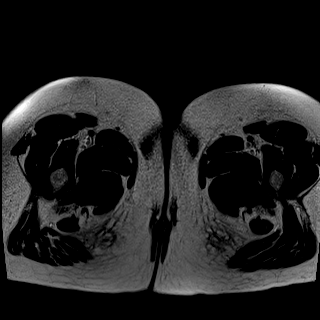
[im 13/90]
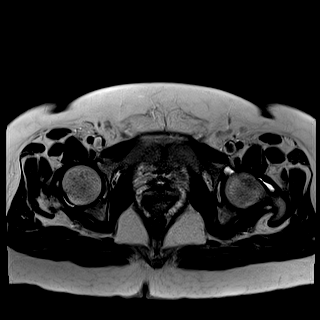
[im 26/90]
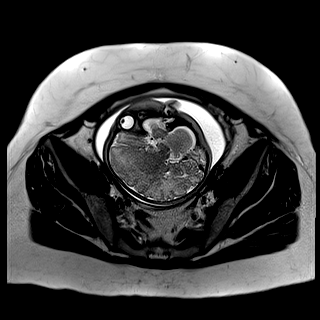
[im 39/90]
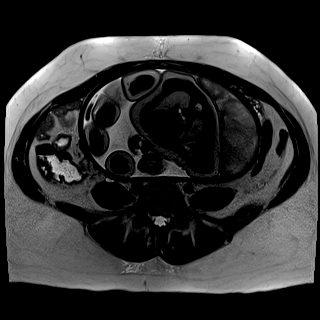
[im 51/90]
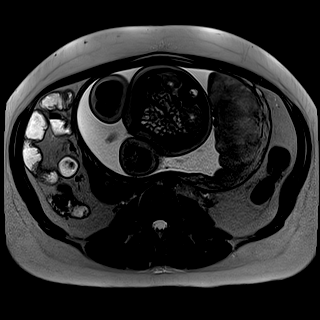
[im 64/90]
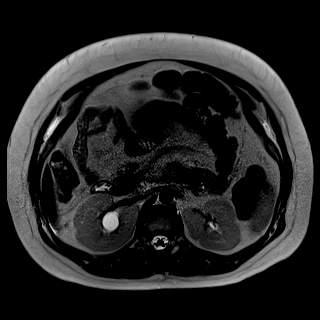
[im 77/90]
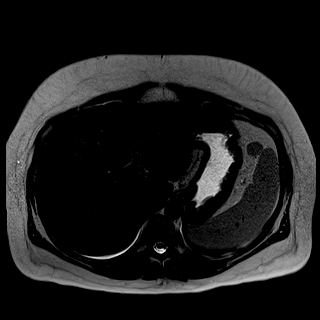
[im 90/90]
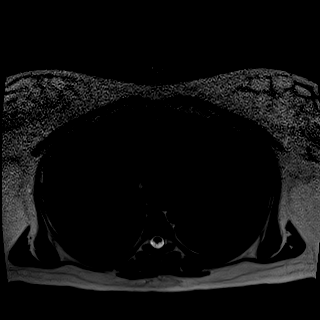

[Series 11: axial haste fs_comp · axial · 5.0mm · 1.09mm/px · z∈[-279,+255]mm · 8 of 90 slices shown]
[im 1/90]
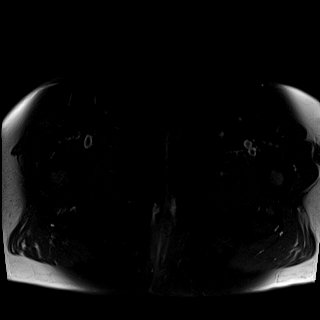
[im 13/90]
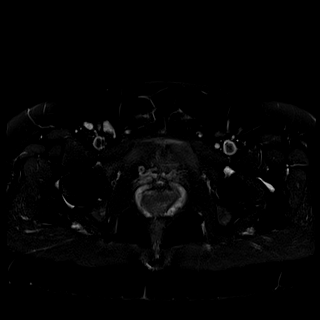
[im 26/90]
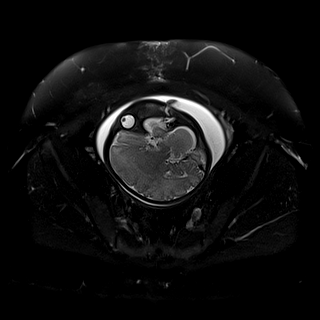
[im 39/90]
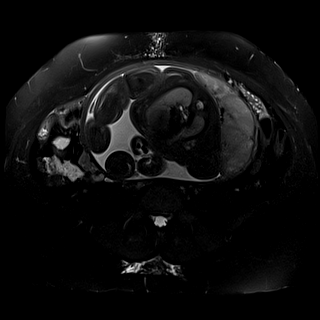
[im 51/90]
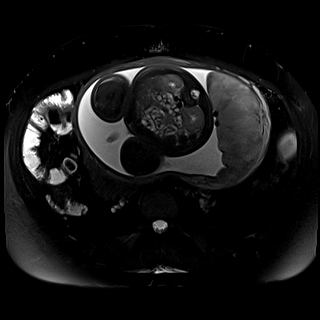
[im 64/90]
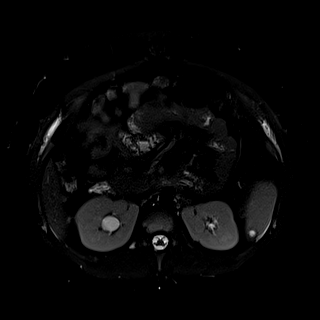
[im 77/90]
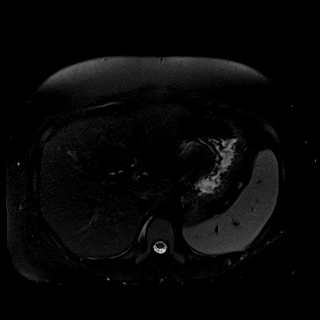
[im 90/90]
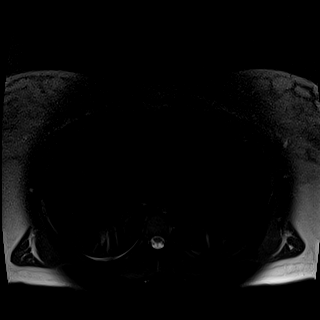

[Series 14: bSSFP · axial · 5.0mm · 0.55mm/px · z∈[-279,-129]mm · 3 of 90 slices shown (2 of 2)]
[im 1/90]
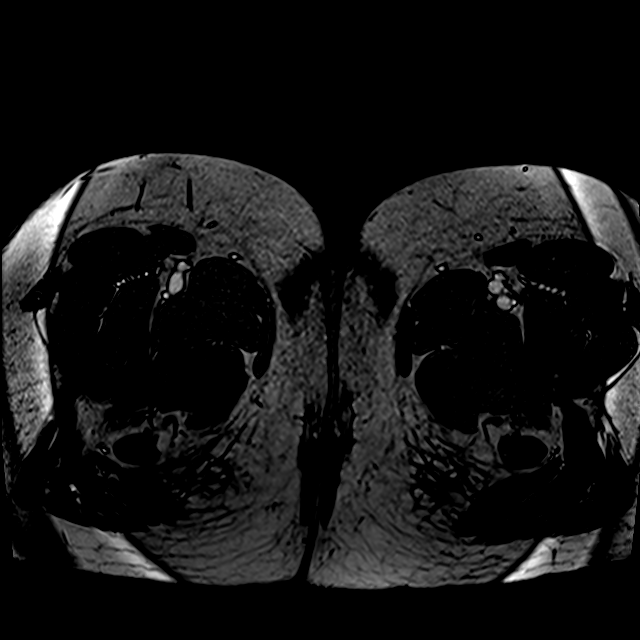
[im 13/90]
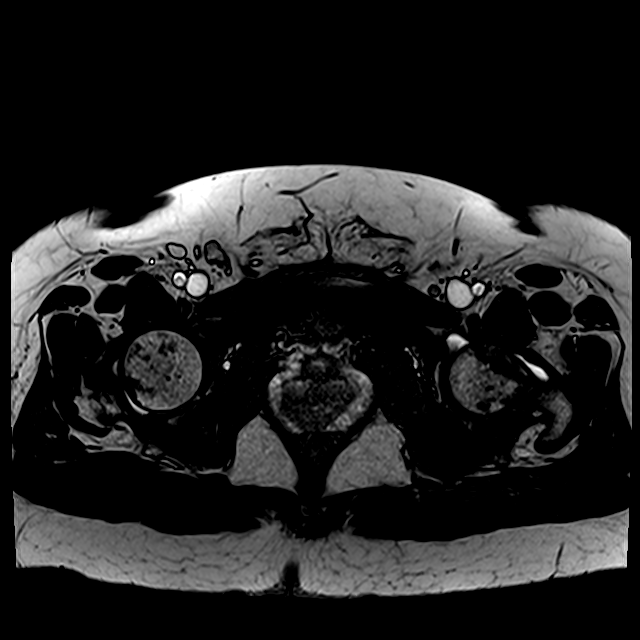
[im 26/90]
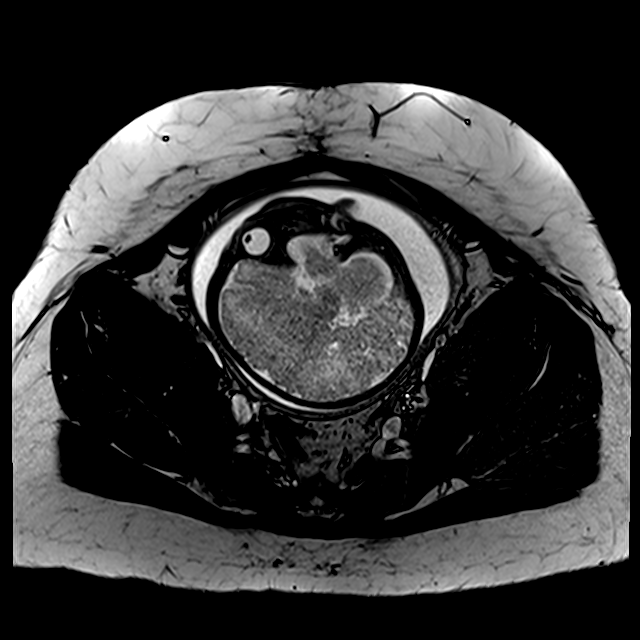

[29 of 48 positions shown; findings below may reference images not displayed]

FINDINGS: Lower chest: Lung bases are clear.

Hepatobiliary: Liver is within normal limits.

Gallbladder is unremarkable. No cholelithiasis, gallbladder wall
thickening, or pericholecystic fluid. No intrahepatic or
extrahepatic ductal dilatation. No choledocholithiasis is seen.

Pancreas:  Within normal limits.

Spleen:  Within normal limits.

Adrenals/Urinary Tract:  Adrenal glands within normal limits.

Right kidney is notable for moderate hydroureteronephrosis, likely
secondary to extrinsic compression by the gravid uterus.

Left kidney is within normal limits.

Bladder is decompressed.

Stomach/Bowel: Stomach is within normal limits.

No evidence of bowel obstruction.

Suspected appendix is normal (series 11/image 45).

Vascular/Lymphatic:  No evidence of abdominal aortic aneurysm.

No suspicious abdominal lymphadenopathy.

Reproductive: Gravid uterus.

Left lateral placenta, free of the cervical os.

Dedicated fetal evaluation was not performed. Cephalic presentation.

Other:  No abdominopelvic ascites.

Musculoskeletal: No focal osseous lesions.
IMPRESSION: No evidence of appendicitis.

No evidence of cholelithiasis or choledocholithiasis.

Moderate right hydroureteronephrosis, likely secondary to extrinsic
compression by the gravid uterus.
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
## 2019-07-30 ENCOUNTER — Ambulatory Visit (INDEPENDENT_AMBULATORY_CARE_PROVIDER_SITE_OTHER): Payer: Managed Care, Other (non HMO)

## 2019-07-30 ENCOUNTER — Ambulatory Visit (INDEPENDENT_AMBULATORY_CARE_PROVIDER_SITE_OTHER): Payer: Managed Care, Other (non HMO) | Admitting: Podiatry

## 2019-07-30 ENCOUNTER — Encounter: Payer: Self-pay | Admitting: Obstetrics and Gynecology

## 2019-07-30 ENCOUNTER — Encounter: Payer: Self-pay | Admitting: Podiatry

## 2019-07-30 ENCOUNTER — Other Ambulatory Visit: Payer: Self-pay | Admitting: Obstetrics and Gynecology

## 2019-07-30 ENCOUNTER — Ambulatory Visit: Payer: Managed Care, Other (non HMO) | Admitting: Obstetrics and Gynecology

## 2019-07-30 ENCOUNTER — Other Ambulatory Visit: Payer: Self-pay

## 2019-07-30 DIAGNOSIS — F329 Major depressive disorder, single episode, unspecified: Secondary | ICD-10-CM

## 2019-07-30 DIAGNOSIS — F32A Depression, unspecified: Secondary | ICD-10-CM

## 2019-07-30 DIAGNOSIS — S92505A Nondisplaced unspecified fracture of left lesser toe(s), initial encounter for closed fracture: Secondary | ICD-10-CM

## 2019-07-30 DIAGNOSIS — S90122A Contusion of left lesser toe(s) without damage to nail, initial encounter: Secondary | ICD-10-CM

## 2019-07-30 DIAGNOSIS — M722 Plantar fascial fibromatosis: Secondary | ICD-10-CM

## 2019-07-30 MED ORDER — TRAMADOL HCL 50 MG PO TABS: 50.0000 mg | ORAL_TABLET | Freq: Three times a day (TID) | ORAL | 0 refills | Status: DC | PRN

## 2019-07-30 MED ORDER — TRAZODONE HCL 50 MG PO TABS: 50.0000 mg | ORAL_TABLET | Freq: Every evening | ORAL | 0 refills | Status: DC | PRN

## 2019-07-30 NOTE — Telephone Encounter (Signed)
Please advise 

## 2019-07-30 NOTE — Progress Notes (Signed)
She presents today with a chief concern of a painful fifth toe left foot.  States that she dropped a barstool on her toe last night while cleaning her house.  She states that she then kicked her son's highchair and then bent down on the toe to clean something.  States that it really hurts badly as does the plantar fasciitis.  She states that she was feeling better with the plantar fasciitis to the left heel and to have improved considerably but over the past few days has become very painful once again.  She states that she continues to wear plantar fascial brace and her night splint and would like to consider orthotics.  Objective: Vital signs are stable alert and oriented x3.  Pulses are palpable.  She has bruising and swelling to the fifth toe left foot exquisitely painful on palpation.  Radiographs taken today demonstrate a fracture of the proximal phalanx that is nondisplaced noncomminuted is just a crack in the bone.  She does have tenderness on palpation medial calcaneal tubercle of the left heel consistent with plantar fasciitis.  Assessment: Plantar fasciitis left fracture fifth digit left.  Plan: I will place her in a cam walker because at this point I do not want to give her another injection of cortisone but I do think a cam walker probably would help with the plantar fasciitis as well as stabilizing his fifth toe so that it does not completely break through.  We will follow-up with her in 1 month and we will question as to whether or not the orthotics are covered.

## 2019-07-30 NOTE — Progress Notes (Signed)
Rx trazodone for insomnia. On zoloft.

## 2019-08-01 ENCOUNTER — Other Ambulatory Visit: Payer: Self-pay | Admitting: Obstetrics and Gynecology

## 2019-08-01 DIAGNOSIS — F32A Depression, unspecified: Secondary | ICD-10-CM

## 2019-08-01 DIAGNOSIS — F419 Anxiety disorder, unspecified: Secondary | ICD-10-CM

## 2019-08-01 DIAGNOSIS — F329 Major depressive disorder, single episode, unspecified: Secondary | ICD-10-CM

## 2019-08-21 ENCOUNTER — Other Ambulatory Visit: Payer: Self-pay | Admitting: Obstetrics and Gynecology

## 2019-08-24 ENCOUNTER — Other Ambulatory Visit: Payer: Self-pay | Admitting: Obstetrics and Gynecology

## 2019-08-24 DIAGNOSIS — F329 Major depressive disorder, single episode, unspecified: Secondary | ICD-10-CM

## 2019-08-24 DIAGNOSIS — F419 Anxiety disorder, unspecified: Secondary | ICD-10-CM

## 2019-08-24 DIAGNOSIS — F32A Depression, unspecified: Secondary | ICD-10-CM

## 2019-08-28 ENCOUNTER — Other Ambulatory Visit: Payer: Self-pay | Admitting: Obstetrics and Gynecology

## 2019-08-28 DIAGNOSIS — F32A Depression, unspecified: Secondary | ICD-10-CM

## 2019-08-28 DIAGNOSIS — F329 Major depressive disorder, single episode, unspecified: Secondary | ICD-10-CM

## 2019-08-28 DIAGNOSIS — F419 Anxiety disorder, unspecified: Secondary | ICD-10-CM

## 2019-09-03 ENCOUNTER — Encounter: Payer: Self-pay | Admitting: Obstetrics and Gynecology

## 2019-09-03 ENCOUNTER — Other Ambulatory Visit: Payer: Self-pay

## 2019-09-03 ENCOUNTER — Ambulatory Visit (INDEPENDENT_AMBULATORY_CARE_PROVIDER_SITE_OTHER): Payer: Managed Care, Other (non HMO) | Admitting: Obstetrics and Gynecology

## 2019-09-03 VITALS — BP 100/80 | Ht 66.0 in | Wt 198.0 lb

## 2019-09-03 DIAGNOSIS — F329 Major depressive disorder, single episode, unspecified: Secondary | ICD-10-CM

## 2019-09-03 DIAGNOSIS — G4700 Insomnia, unspecified: Secondary | ICD-10-CM

## 2019-09-03 DIAGNOSIS — Z30432 Encounter for removal of intrauterine contraceptive device: Secondary | ICD-10-CM | POA: Diagnosis not present

## 2019-09-03 DIAGNOSIS — F419 Anxiety disorder, unspecified: Secondary | ICD-10-CM | POA: Diagnosis not present

## 2019-09-03 DIAGNOSIS — F32A Depression, unspecified: Secondary | ICD-10-CM

## 2019-09-03 DIAGNOSIS — Z01419 Encounter for gynecological examination (general) (routine) without abnormal findings: Secondary | ICD-10-CM

## 2019-09-03 DIAGNOSIS — Z3169 Encounter for other general counseling and advice on procreation: Secondary | ICD-10-CM

## 2019-09-03 MED ORDER — TRAZODONE HCL 100 MG PO TABS: 100.0000 mg | ORAL_TABLET | Freq: Every evening | ORAL | 2 refills | Status: DC | PRN

## 2019-09-03 MED ORDER — SERTRALINE HCL 100 MG PO TABS: 150.0000 mg | ORAL_TABLET | Freq: Every day | ORAL | 2 refills | Status: DC

## 2019-09-03 NOTE — Progress Notes (Signed)
PCP:  Patient, No Pcp Per   Chief Complaint  Patient presents with  . Gynecologic Exam    thinking of IUD removal today, plans on conceiving  . Follow-up    anxiety/depression     HPI:      [name] is a 24 y.o. G1P1001 who LMP was Patient's last menstrual period was 09/01/2019 (exact date)., presents today for her annual examination.  Her menses are regular every 28-30 days, lasting 7 days with IUD.  Dysmenorrhea mild to mod, improved with NSAIDs. She does not have intermenstrual bleeding.  Sex activity: single partner, contraception - IUD. Mirena placed 05/23/18. Would like removed for conception. Not taking PNVs. Last Pap: September 27, 2017  Results were: no abnormalities /neg HPV DNA   There is no FH of breast cancer. There is no FH of ovarian cancer. The patient does not do self-breast exams.  Tobacco use: 3-5 cigs daily, trying hard to quit Alcohol use: none No drug use.  Exercise: moderately active  She does get adequate calcium but not  Vitamin D in her diet.  Hx of anxiety/ depression sx; initially seen 11/20. Started on zoloft 50 mg dose. Increased dose to 100 mg 1/21 due to persistent sx. Sx a little better but still persist. Pt then emailed 07/30/19 with issues with excessive cleaning/OCP tendencies and insomnia. Started trazodone 50 mg without relief of insomnia so tried 100 mg dose with sx relief. Hasn't seen therapist yet (as suggested), but has left messages for some to start being seen. Hx of ADHD in past as well, didn't do well with adderall. Now pt wants to conceive so med options limited.    Past Medical History:  Diagnosis Date  . ADD (attention deficit disorder)   . Anemia   . Anxiety and depression 05/10/2019  . Asthma    WELL CONTROLLED  . GERD (gastroesophageal reflux disease)    OCC TUMS PRN  . Headache    MIGRAINES  . History of substance abuse [facility])     Past Surgical History:  Procedure Laterality Date  . MYRINGOTOMY WITH TUBE PLACEMENT  Left 08/16/2018   Procedure: MYRINGOTOMY WITH TUBE PLACEMENT;  Surgeon: Geanie Logan, MD;  Location: [facility] ORS;  Service: ENT;  Laterality: Left;  . TONSILLECTOMY  2014   Pt not sure if addenoids taken  . TYMPANOPLASTY WITH GRAFT Right 08/16/2018   Procedure: TYMPANOMASTOIDECTOMY WITH POSSIBLE OSSICULAR GRAFT;  Surgeon: Geanie Logan, MD;  Location: [facility] ORS;  Service: ENT;  Laterality: Right;  . WISDOM TOOTH EXTRACTION  2014    Family History  Problem Relation Age of Onset  . Hypertension Mother   . Hypertension Father   . Hypertension Maternal Grandmother   . Diabetes Maternal Grandmother   . Thyroid disease Maternal Grandmother   . Hypertension Maternal Grandfather   . Thyroid disease Maternal Grandfather   . Hypertension Paternal Grandmother   . Thyroid disease Paternal Grandmother   . Hypertension Paternal Grandfather   . Thyroid disease Paternal Grandfather   . Hypertension Maternal Aunt   . Hypertension Maternal Uncle   . Hypertension Paternal Aunt   . Diabetes Paternal Aunt   . Hypertension Paternal Uncle     Social History   Socioeconomic History  . Marital status: Single    Spouse name: Not on file  . Number of children: 0  . Years of education: 21  . Highest education level: Not on file  Occupational History  . Occupation: Conservation officer, nature    Comment: [employer]  . Occupation: call center  Tobacco Use  . Smoking status: Current Every Day Smoker    Packs/day: 0.25    Years: 10.00    Pack years: 2.50    Types: Cigarettes  . Smokeless tobacco: Never Used  Substance and Sexual Activity  . Alcohol use: No  . Drug use: Yes    Types: Marijuana    Comment: hx of cocaine, meth, and acid use-Last used 2017  . Sexual activity: Yes    Birth control/protection: I.U.D.    Comment: MIrena  Other Topics Concern  . Not on file  Social History Narrative  . Not on file   Social Determinants of Health   Financial Resource Strain:   . Difficulty of Paying Living Expenses:  Not on file  Food Insecurity:   . Worried About Programme researcher, broadcasting/film/video in the Last Year: Not on file  . Ran Out of Food in the Last Year: Not on file  Transportation Needs:   . Lack of Transportation (Medical): Not on file  . Lack of Transportation (Non-Medical): Not on file  Physical Activity:   . Days of Exercise per Week: Not on file  . Minutes of Exercise per Session: Not on file  Stress:   . Feeling of Stress : Not on file  Social Connections:   . Frequency of Communication with Friends and Family: Not on file  . Frequency of Social Gatherings with Friends and Family: Not on file  . Attends Religious Services: Not on file  . Active Member of Clubs or Organizations: Not on file  . Attends Banker Meetings: Not on file  . Marital Status: Not on file  Intimate Partner Violence:   . Fear of Current or Ex-Partner: Not on file  . Emotionally Abused: Not on file  . Physically Abused: Not on file  . Sexually Abused: Not on file     Current Outpatient Medications:  .  azelastine (ASTELIN) 0.1 % nasal spray, SMARTSIG:1-2 Spray(s) Both Nares Twice Daily, Disp: , Rfl:  .  fluticasone (FLONASE) 50 MCG/ACT nasal spray, Place 2 sprays into both nostrils daily., Disp: , Rfl:  .  montelukast (SINGULAIR) 10 MG tablet, Take 10 mg by mouth daily., Disp: , Rfl:  .  sertraline (ZOLOFT) 100 MG tablet, Take 1.5 tablets (150 mg total) by mouth daily., Disp: 45 tablet, Rfl: 2 .  traMADol (ULTRAM) 50 MG tablet, Take 1 tablet (50 mg total) by mouth every 8 (eight) hours as needed., Disp: 20 tablet, Rfl: 0 .  traZODone (DESYREL) 100 MG tablet, Take 1 tablet (100 mg total) by mouth at bedtime as needed for sleep., Disp: 30 tablet, Rfl: 2     ROS:  Review of Systems  Constitutional: Negative for fatigue, fever and unexpected weight change.  Respiratory: Positive for shortness of breath. Negative for cough and wheezing.   Cardiovascular: Negative for chest pain, palpitations and leg  swelling.  Gastrointestinal: Negative for blood in stool, constipation, diarrhea, nausea and vomiting.  Endocrine: Negative for cold intolerance, heat intolerance and polyuria.  Genitourinary: Negative for dyspareunia, dysuria, flank pain, frequency, genital sores, hematuria, menstrual problem, pelvic pain, urgency, vaginal bleeding, vaginal discharge and vaginal pain.  Musculoskeletal: Negative for back pain, joint swelling and myalgias.  Skin: Negative for rash.  Neurological: Positive for headaches. Negative for dizziness, syncope, light-headedness and numbness.  Hematological: Negative for adenopathy.  Psychiatric/Behavioral: Positive for agitation. Negative for confusion, sleep disturbance and suicidal ideas. The patient is not nervous/anxious.  BREAST: No symptoms   Objective: BP 100/80   Ht 5\' 6"  (1.676 m)   Wt 198 lb (89.8 kg)   LMP 09/01/2019 (Exact Date)   Breastfeeding No   BMI 31.96 kg/m    Physical Exam Constitutional:      Appearance: She is well-developed.  Genitourinary:     Vulva, vagina, cervix, uterus, right adnexa and left adnexa normal.     No vulval lesion or tenderness noted.     No vaginal discharge, erythema or tenderness.     No cervical polyp.     IUD strings visualized.     Uterus is not enlarged or tender.     No right or left adnexal mass present.     Right adnexa not tender.     Left adnexa not tender.  Neck:     Thyroid: No thyromegaly.  Cardiovascular:     Rate and Rhythm: Normal rate and regular rhythm.     Heart sounds: Normal heart sounds. No murmur.  Pulmonary:     Effort: Pulmonary effort is normal.     Breath sounds: Normal breath sounds.  Chest:     Breasts:        Right: No mass, nipple discharge, skin change or tenderness.        Left: No mass, nipple discharge, skin change or tenderness.  Abdominal:     Palpations: Abdomen is soft.     Tenderness: There is no abdominal tenderness. There is no guarding.  Musculoskeletal:         General: Normal range of motion.     Cervical back: Normal range of motion.  Neurological:     General: No focal deficit present.     Mental Status: She is alert and oriented to person, place, and time.     Cranial Nerves: No cranial nerve deficit.  Skin:    General: Skin is warm and dry.  Psychiatric:        Mood and Affect: Mood normal.        Behavior: Behavior normal.        Thought Content: Thought content normal.        Judgment: Judgment normal.  Vitals reviewed.    IUD Removal Strings of IUD identified and grasped.  IUD removed without problem with ring forceps.  Pt tolerated this well.  IUD noted to be intact.  Results: GAD 7 : Generalized Anxiety Score 09/03/2019 07/02/2019 05/10/2019  Nervous, Anxious, on Edge 3 3 3   Control/stop worrying 3 3 3   Worry too much - different things 3 3 3   Trouble relaxing 3 3 3   Restless 3 3 2   Easily annoyed or irritable 3 3 3   Afraid - awful might happen 2 3 3   Total GAD 7 Score 20 21 20   Anxiety Difficulty Extremely difficult Extremely difficult Very difficult    Depression screen PHQ 2/9 09/03/2019  Decreased Interest 1  Down, Depressed, Hopeless 1  PHQ - 2 Score 2  Altered sleeping 2  Tired, decreased energy 1  Change in appetite 3  Feeling bad or failure about yourself  2  Trouble concentrating 3  Moving slowly or fidgety/restless 3  Suicidal thoughts 0  PHQ-9 Score 16  Difficult doing work/chores -     Assessment/Plan: Encounter for annual routine gynecological examination  Encounter for IUD removal--pt tolerated well. F/u prn NOB.  Pre-conception counseling--start PNVs. Discussed zoloft as safe option during pregnancy but trazodone is Cat C. May be fatigued anyway and not  need sleeping med.   Anxiety and depression - Plan: sertraline (ZOLOFT) 100 MG tablet. No SI. Increase to 150 mg dose, see therapist for OCD/cleaning tendencies. Paxil not safe in pregnancy, although better for OCD. Re-eval during pregnancy. Pt  may need different tx regimen with PCP post partum, especially given hx of ADHD.   Insomnia, unspecified type - Plan: traZODone (DESYREL) 100 MG tablet; Rx trazodone for now, d/c if pregnant.   Meds ordered this encounter  Medications  . sertraline (ZOLOFT) 100 MG tablet    Sig: Take 1.5 tablets (150 mg total) by mouth daily.    Dispense:  45 tablet    Refill:  2    DX Code Needed  .    Order Specific Question:   Supervising Provider    Answer:   Gae Dry [[id number]]  . traZODone (DESYREL) 100 MG tablet    Sig: Take 1 tablet (100 mg total) by mouth at bedtime as needed for sleep.    Dispense:  30 tablet    Refill:  2    Order Specific Question:   Supervising Provider    Answer:   Gae Dry [[id number]]             GYN counsel adequate intake of calcium and vitamin D, diet and exercise     F/U  Return in about 1 year (around 09/02/2020).  Alicia B. Copland, PA-C 09/03/2019 5:05 PM

## 2019-09-03 NOTE — Patient Instructions (Signed)
I value your feedback and entrusting us with your care. If you get a  patient survey, I would appreciate you taking the time to let us know about your experience today. Thank you!  As of June 07, 2019, your lab results will be released to your MyChart immediately, before I even have a chance to see them. Please give me time to review them and contact you if there are any abnormalities. Thank you for your patience.  

## 2019-09-10 ENCOUNTER — Ambulatory Visit (INDEPENDENT_AMBULATORY_CARE_PROVIDER_SITE_OTHER): Payer: Managed Care, Other (non HMO) | Admitting: Podiatry

## 2019-09-10 ENCOUNTER — Other Ambulatory Visit: Payer: Self-pay

## 2019-09-10 ENCOUNTER — Ambulatory Visit (INDEPENDENT_AMBULATORY_CARE_PROVIDER_SITE_OTHER): Payer: Managed Care, Other (non HMO)

## 2019-09-10 ENCOUNTER — Encounter: Payer: Self-pay | Admitting: Podiatry

## 2019-09-10 DIAGNOSIS — M722 Plantar fascial fibromatosis: Secondary | ICD-10-CM

## 2019-09-10 DIAGNOSIS — S92505D Nondisplaced unspecified fracture of left lesser toe(s), subsequent encounter for fracture with routine healing: Secondary | ICD-10-CM

## 2019-09-10 NOTE — Progress Notes (Signed)
She presents today for follow-up of her left heel pain.  She states that she never heard from the office as far as setting up a appointment for orthotics.  She states that the pain is starting to radiate up her leg she states that her fifth toe no longer hurts.  Objective: Vital signs are stable she is alert and oriented x3.  Pulses are palpable.  She has no pain about the fifth toe of the left foot radiographs taken today do not demonstrate any displacement of the phalanges and proximal phalanx appears to be healed.  She also has tarsal tunnel type symptoms with radiating pain proximally positive Tinel's sign in the tarsal canal.  She still has severe pain on palpation medial calcaneal tubercle of her left heel.  Assessment: Chronic intractable plantar fasciitis left heel.  Tarsal tunnel left foot.  Plan: Discussed etiology pathology and surgical therapies at this point injected her left heel with 20 mg Kenalog 5 mg of Marcaine.  Scheduled her for orthotics with Fenton Foy or Rick in [place] and we are going to try our best to go ahead and get her an MRI with contrast.

## 2019-09-13 ENCOUNTER — Telehealth: Payer: Self-pay

## 2019-09-13 NOTE — Telephone Encounter (Signed)
-----   Message from Ashley E Prevette, PMAC sent at 09/11/2019 10:56 AM EDT ----- Regarding: FW: MRI Hey Angie- he said he wanted this WITH contrast!  ----- Message ----- From: Prevette, Ashley E, PMAC Sent: 09/10/2019   4:08 PM EDT To: Mattelyn Imhoff D Alayla Dethlefs, LPN Subject: MRI                                            MRI left ankle - evaluate tarsal tunnel and plantar fascial tear left - surgical consideration   

## 2019-09-13 NOTE — Telephone Encounter (Signed)
Office notes have been uploaded to [facility] for further review

## 2019-09-17 ENCOUNTER — Telehealth: Payer: Self-pay

## 2019-09-17 DIAGNOSIS — G5752 Tarsal tunnel syndrome, left lower limb: Secondary | ICD-10-CM

## 2019-09-17 DIAGNOSIS — M722 Plantar fascial fibromatosis: Secondary | ICD-10-CM

## 2019-09-17 NOTE — Addendum Note (Signed)
Addended by: Geraldine Contras D on: 09/17/2019 05:39 PM   Modules accepted: Orders

## 2019-09-17 NOTE — Telephone Encounter (Signed)
Patient left message stating that she scheduled her MRI appt for 09/28/2019 @ 11am, but there may be a possibility that she is pregnant.  MRI tech requested an order for urine pregnancy test.   Order has been placed in chart.

## 2019-09-17 NOTE — Telephone Encounter (Signed)
-----   Message from Kristian Covey, [facility] sent at 09/11/2019 10:56 AM EDT ----- Regarding: FW: MRI Hey Angie- he said he wanted this WITH contrast!  ----- Message ----- From: Kristian Covey, PMAC Sent: 09/10/2019   4:08 PM EDT To: Constance Haw, LPN Subject: MRI                                            MRI left ankle - evaluate tarsal tunnel and plantar fascial tear left - surgical consideration

## 2019-09-17 NOTE — Telephone Encounter (Signed)
MRI approved from 09/14/2019 to 03/11/2020 Auth # [id number] Patient notified via voice mail of approval and informed to call scheduling to set up appt to her convenience

## 2019-09-18 NOTE — Addendum Note (Signed)
Addended by: Geraldine Contras D on: 09/18/2019 02:52 PM   Modules accepted: Orders

## 2019-09-23 ENCOUNTER — Emergency Department
Admission: EM | Admit: 2019-09-23 | Discharge: 2019-09-23 | Disposition: A | Discharge status: Home / Self Care | Payer: Managed Care, Other (non HMO) | Attending: Student | Admitting: Student

## 2019-09-23 ENCOUNTER — Encounter: Payer: Self-pay | Admitting: Podiatry

## 2019-09-23 ENCOUNTER — Other Ambulatory Visit: Payer: Self-pay

## 2019-09-23 DIAGNOSIS — M722 Plantar fascial fibromatosis: Secondary | ICD-10-CM | POA: Insufficient documentation

## 2019-09-23 DIAGNOSIS — J45909 Unspecified asthma, uncomplicated: Secondary | ICD-10-CM | POA: Diagnosis not present

## 2019-09-23 DIAGNOSIS — M79672 Pain in left foot: Secondary | ICD-10-CM | POA: Diagnosis present

## 2019-09-23 DIAGNOSIS — Z79899 Other long term (current) drug therapy: Secondary | ICD-10-CM | POA: Insufficient documentation

## 2019-09-23 LAB — POCT PREGNANCY, URINE: Preg Test, Ur: NEGATIVE

## 2019-09-23 MED ORDER — HYDROCODONE-ACETAMINOPHEN 5-325 MG PO TABS: 1.0000 | ORAL_TABLET | ORAL | 0 refills | Status: DC | PRN

## 2019-09-23 MED ORDER — PREDNISONE 50 MG PO TABS: 50.0000 mg | ORAL_TABLET | Freq: Every day | ORAL | 0 refills | Status: DC

## 2019-09-23 MED ORDER — MELOXICAM 7.5 MG PO TABS
15.0000 mg | ORAL_TABLET | Freq: Once | ORAL | Status: AC
  Administered 2019-09-23: 15 mg via ORAL
  Filled 2019-09-23: qty 2

## 2019-09-23 MED ORDER — MELOXICAM 15 MG PO TABS: 15.0000 mg | ORAL_TABLET | Freq: Every day | ORAL | 0 refills | Status: DC

## 2019-09-23 MED ORDER — PREDNISONE 20 MG PO TABS
60.0000 mg | ORAL_TABLET | Freq: Once | ORAL | Status: AC
  Administered 2019-09-23: 60 mg via ORAL
  Filled 2019-09-23: qty 3

## 2019-09-23 NOTE — ED Triage Notes (Signed)
Pt scheduled for MRI Friday for left foot/ankle - started with pain in Dec and has 3 cortisone shots in the heel - c/o left foot numbness
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
## 2019-09-24 ENCOUNTER — Encounter: Payer: Self-pay | Admitting: *Deleted

## 2019-09-24 ENCOUNTER — Other Ambulatory Visit: Payer: Self-pay

## 2019-09-24 DIAGNOSIS — M722 Plantar fascial fibromatosis: Secondary | ICD-10-CM

## 2019-09-25 ENCOUNTER — Encounter: Payer: Self-pay | Admitting: Podiatry

## 2019-09-25 ENCOUNTER — Telehealth: Payer: Self-pay | Admitting: Podiatry

## 2019-09-25 ENCOUNTER — Other Ambulatory Visit: Payer: Self-pay | Admitting: Podiatry

## 2019-09-25 NOTE — Telephone Encounter (Signed)
Imaging called to say that pt MRI will be canceled the pts insurance is code specific

## 2019-09-26 ENCOUNTER — Ambulatory Visit: Payer: Managed Care, Other (non HMO)

## 2019-09-26 LAB — HUMAN CHORIONIC GONADOTROPIN(HCG),B-SUBUNIT,QUANTITATIVE): HCG, Beta Chain, Quant, S: <1 m[IU]/mL

## 2019-09-27 ENCOUNTER — Ambulatory Visit (INDEPENDENT_AMBULATORY_CARE_PROVIDER_SITE_OTHER): Payer: Managed Care, Other (non HMO) | Admitting: Orthotics

## 2019-09-27 ENCOUNTER — Other Ambulatory Visit: Payer: Self-pay

## 2019-09-27 DIAGNOSIS — M7732 Calcaneal spur, left foot: Secondary | ICD-10-CM

## 2019-09-27 DIAGNOSIS — M722 Plantar fascial fibromatosis: Secondary | ICD-10-CM | POA: Diagnosis not present

## 2019-09-27 DIAGNOSIS — M2142 Flat foot [pes planus] (acquired), left foot: Secondary | ICD-10-CM

## 2019-09-27 DIAGNOSIS — M2141 Flat foot [pes planus] (acquired), right foot: Secondary | ICD-10-CM

## 2019-09-27 NOTE — Progress Notes (Signed)

## 2019-09-28 ENCOUNTER — Ambulatory Visit: Payer: Managed Care, Other (non HMO)

## 2019-09-30 ENCOUNTER — Ambulatory Visit
Admission: RE | Admit: 2019-09-30 | Discharge: 2019-09-30 | Disposition: A | Discharge status: Home / Self Care | Payer: Managed Care, Other (non HMO) | Source: Ambulatory Visit | Attending: Podiatry | Admitting: Podiatry

## 2019-09-30 DIAGNOSIS — M722 Plantar fascial fibromatosis: Secondary | ICD-10-CM | POA: Diagnosis not present

## 2019-09-30 MED ORDER — GADOBUTROL 1 MMOL/ML IV SOLN
9.0000 mL | Freq: Once | INTRAVENOUS | Status: AC | PRN
  Administered 2019-09-30: 9 mL via INTRAVENOUS

## 2019-10-01 ENCOUNTER — Ambulatory Visit: Payer: Managed Care, Other (non HMO) | Admitting: Podiatry

## 2019-10-02 ENCOUNTER — Other Ambulatory Visit: Payer: Self-pay

## 2019-10-02 ENCOUNTER — Telehealth: Payer: Self-pay | Admitting: *Deleted

## 2019-10-02 ENCOUNTER — Ambulatory Visit (INDEPENDENT_AMBULATORY_CARE_PROVIDER_SITE_OTHER): Payer: Managed Care, Other (non HMO) | Admitting: Podiatry

## 2019-10-02 ENCOUNTER — Encounter: Payer: Self-pay | Admitting: *Deleted

## 2019-10-02 DIAGNOSIS — M722 Plantar fascial fibromatosis: Secondary | ICD-10-CM | POA: Diagnosis not present

## 2019-10-02 DIAGNOSIS — M79672 Pain in left foot: Secondary | ICD-10-CM

## 2019-10-02 NOTE — Patient Instructions (Signed)
Pre-Operative Instructions  Congratulations, you have decided to take an important step towards improving your quality of life.  You can be assured that the doctors and staff at [facility] will be with you every step of the way.  Here are some important things you should know:  1. Plan to be at the surgery center/hospital at least 1 (one) hour prior to your scheduled time, unless otherwise directed by the surgical center/hospital staff.  You must have a responsible adult accompany you, remain during the surgery and drive you home.  Make sure you have directions to the surgical center/hospital to ensure you arrive on time. 2. If you are having surgery at [facility] or [facility], you will need a copy of your medical history and physical form from your family physician within one month prior to the date of surgery. We will give you a form for your primary physician to complete.  3. We make every effort to accommodate the date you request for surgery.  However, there are times where surgery dates or times have to be moved.  We will contact you as soon as possible if a change in schedule is required.   4. No aspirin/ibuprofen for one week before surgery.  If you are on aspirin, any non-steroidal anti-inflammatory medications (Mobic, Aleve, Ibuprofen) should not be taken seven (7) days prior to your surgery.  You make take Tylenol for pain prior to surgery.  5. Medications - If you are taking daily heart and blood pressure medications, seizure, reflux, allergy, asthma, anxiety, pain or diabetes medications, make sure you notify the surgery center/hospital before the day of surgery so they can tell you which medications you should take or avoid the day of surgery. 6. No food or drink after midnight the night before surgery unless directed otherwise by surgical center/hospital staff. 7. No alcoholic beverages 24-hours prior to surgery.  No smoking 24-hours prior or 24-hours after  surgery. 8. Wear loose pants or shorts. They should be loose enough to fit over bandages, boots, and casts. 9. Don't wear slip-on shoes. Sneakers are preferred. 10. Bring your boot with you to the surgery center/hospital.  Also bring crutches or a walker if your physician has prescribed it for you.  If you do not have this equipment, it will be provided for you after surgery. 11. If you have not been contacted by the surgery center/hospital by the day before your surgery, call to confirm the date and time of your surgery. 12. Leave-time from work may vary depending on the type of surgery you have.  Appropriate arrangements should be made prior to surgery with your employer. 13. Prescriptions will be provided immediately following surgery by your doctor.  Fill these as soon as possible after surgery and take the medication as directed. Pain medications will not be refilled on weekends and must be approved by the doctor. 14. Remove nail polish on the operative foot and avoid getting pedicures prior to surgery. 15. Wash the night before surgery.  The night before surgery wash the foot and leg well with water and the antibacterial soap provided. Be sure to pay special attention to beneath the toenails and in between the toes.  Wash for at least three (3) minutes. Rinse thoroughly with water and dry well with a towel.  Perform this wash unless told not to do so by your physician.  Enclosed: 1 Ice pack (please put in freezer the night before surgery)   1 Hibiclens skin cleaner     Pre-op instructions  If you have any questions regarding the instructions, please do not hesitate to call our office.  [place]: [address], [place], [place] [address] -- [phone]  [place]: [address] [place], [place] [address] -- [phone]  [place]: [address], [place],  [address] -- [phone]   Website: [email or web address] 

## 2019-10-02 NOTE — Telephone Encounter (Signed)
-----   Message from Elinor Parkinson, [place] sent at 10/02/2019  7:07 AM EDT ----- Please send for over read and inform the patient of the delay.  Thanks.

## 2019-10-03 LAB — ARTHRITIS PANEL
Anti Nuclear Antibody (ANA): NEGATIVE
Rheumatoid fact SerPl-aCnc: <10 IU/mL (ref 0.0–13.9)
Sed Rate: 3 mm/hr (ref 0–32)
Uric Acid: 2.8 mg/dL (ref 2.6–6.2)

## 2019-10-03 NOTE — Telephone Encounter (Signed)
Patient was seen by Dr. Logan Bores yesterday and has decided to schedule surgery.

## 2019-10-04 NOTE — Progress Notes (Signed)
   Subjective: 24 y.o. female presenting today for follow up evaluation of plantar fasciitis of the left foot. She states her symptoms have not improved and she reports continued severe pain. She reports intermittent associated swelling. She has been using the CAM boot and resting the foot as much as possible. She reports taking Prednisone and Meloxicam for treatment which helped at the time. She had an MRI on 09/30/2019 and is here to review the results. Patient is here for further evaluation and treatment.   Past Medical History:  Diagnosis Date  . ADD (attention deficit disorder)   . Anemia   . Anxiety and depression 05/10/2019  . Asthma    WELL CONTROLLED  . GERD (gastroesophageal reflux disease)    OCC TUMS PRN  . Headache    MIGRAINES  . History of substance abuse (HCC)      Objective: Physical Exam General: The patient is alert and oriented x3 in no acute distress.  Dermatology: Skin is warm, dry and supple bilateral lower extremities. Negative for open lesions or macerations bilateral.   Vascular: Dorsalis Pedis and Posterior Tibial pulses palpable bilateral.  Capillary fill time is immediate to all digits.  Neurological: Epicritic and protective threshold intact bilateral.   Musculoskeletal: Tenderness to palpation to the plantar aspect of the left heel along the plantar fascia. All other joints range of motion within normal limits bilateral. Strength 5/5 in all groups bilateral.   MRI Impression done on 09/30/2019:  1. Joint effusions involving the ankle, midfoot and hindfoot and tenosynovitis may suggest an inflammatory arthropathy such as rheumatoid arthritis. No erosive changes or chondral degenerative changes. 2. Mild changes of plantar fasciitis. 3. No stress fracture, bone lesion or osteochondral abnormality. 4. The medial and lateral ankle ligaments are intact.  Assessment: 1. Plantar fasciitis left foot 2. Generalized inflammation left foot   Plan of Care:  1.  Patient evaluated. MRI reviewed.   2. Today we discussed the conservative versus surgical management of the presenting pathology. The patient opts for surgical management. All possible complications and details of the procedure were explained. All patient questions were answered. No guarantees were expressed or implied. 3. Authorization for surgery was initiated today. Surgery will consist of EPF left.  4. Orders for arthritic panel placed today.  5. Continue using CAM boot as needed.  6. Continue taking Meloxicam.  7. Custom orthotics have been molded. Currently processing.  8. Return to clinic one week post op.   Works at [employer].    Felecia Shelling, DPM [facility]  Dr. Felecia Shelling, DPM    [address] [place], [place] [address]                Office [phone]  Fax [phone]

## 2019-10-10 ENCOUNTER — Telehealth: Payer: Self-pay

## 2019-10-10 NOTE — Telephone Encounter (Signed)
DOS 11/01/19   EPF LT - 65800  [employer] EFFECTIVE DATE - 06/28/2012  PLAN DEDUCTIBLE - $750 W/ $750 MET OUT OF POCKET - $2250 W/ $2250 MET COPAY $0.00 COINSURANCE - 80%  PER [employer] (CALL REF # [id number]) NO PRECERT IS REQUIRED FOR CPT [id number].

## 2019-10-18 ENCOUNTER — Ambulatory Visit (INDEPENDENT_AMBULATORY_CARE_PROVIDER_SITE_OTHER): Payer: Managed Care, Other (non HMO) | Admitting: Orthotics

## 2019-10-18 ENCOUNTER — Other Ambulatory Visit: Payer: Self-pay

## 2019-10-18 DIAGNOSIS — M79672 Pain in left foot: Secondary | ICD-10-CM

## 2019-10-18 DIAGNOSIS — M722 Plantar fascial fibromatosis: Secondary | ICD-10-CM

## 2019-10-18 NOTE — Progress Notes (Signed)
Patient came in today to pick up custom made foot orthotics.  The goals were accomplished and the patient reported no dissatisfaction with said orthotics.  Patient was advised of breakin period and how to report any issues. 

## 2019-10-24 ENCOUNTER — Telehealth: Payer: Self-pay | Admitting: Podiatry

## 2019-10-24 NOTE — Telephone Encounter (Signed)
Patient called in and request a refill of medication that was prescribed by another provider at the ER.

## 2019-10-26 ENCOUNTER — Telehealth: Payer: Self-pay

## 2019-10-26 MED ORDER — MELOXICAM 15 MG PO TABS: 15.0000 mg | ORAL_TABLET | Freq: Every day | ORAL | 2 refills | Status: DC

## 2019-10-26 NOTE — Telephone Encounter (Signed)
I spoke with patient, she stated that she was able to get her Trazodone refilled but requested a refill of her Meloxicam.  Per Dr. Geryl Rankins verbal order, ok to refill.  Script has been sent to pharmacy

## 2019-10-26 NOTE — Telephone Encounter (Signed)
-----   Message from [facility] sent at 10/24/2019  4:14 PM EDT ----- Regarding: Medication request The patient called in an requested that Dr. Al Corpus refill some medication that was prescribed at the ER x2 weeks ago. Maloxocam, and Trazodone. The patient is requesting a return call concerning whether she can get the Rx filled by Dr. Al Corpus. A message can be left on her vm. Patient is scheduled for surgery on May 06,2021.

## 2019-10-27 HISTORY — PX: FOOT SURGERY: SHX648

## 2019-10-30 ENCOUNTER — Encounter: Payer: Self-pay | Admitting: Podiatry

## 2019-11-01 ENCOUNTER — Other Ambulatory Visit: Payer: Self-pay | Admitting: Podiatry

## 2019-11-01 DIAGNOSIS — M722 Plantar fascial fibromatosis: Secondary | ICD-10-CM

## 2019-11-01 MED ORDER — OXYCODONE-ACETAMINOPHEN 5-325 MG PO TABS: 1.0000 | ORAL_TABLET | Freq: Four times a day (QID) | ORAL | 0 refills | Status: DC | PRN

## 2019-11-01 NOTE — Progress Notes (Signed)
PRN postop 

## 2019-11-02 ENCOUNTER — Other Ambulatory Visit: Payer: Self-pay

## 2019-11-02 ENCOUNTER — Ambulatory Visit (INDEPENDENT_AMBULATORY_CARE_PROVIDER_SITE_OTHER): Payer: Managed Care, Other (non HMO) | Admitting: Podiatry

## 2019-11-02 VITALS — BP 109/75 | HR 73 | Temp 98.7°F | Resp 16

## 2019-11-02 DIAGNOSIS — M722 Plantar fascial fibromatosis: Secondary | ICD-10-CM

## 2019-11-02 DIAGNOSIS — Z9889 Other specified postprocedural states: Secondary | ICD-10-CM

## 2019-11-07 NOTE — Progress Notes (Signed)
   Subjective:  Patient presents today status post EPF left. DOS: 11/01/2019. She states her surgery was yesterday and she is concerned because her foot is numb and her toes are "freezing cold". She has been using the CAM boot and is afraid her bandage may be too tight. She does not have any crutches or a knee scooter so she "is struggling to get around". Patient is here for further evaluation and treatment.    Past Medical History:  Diagnosis Date  . ADD (attention deficit disorder)   . Anemia   . Anxiety and depression 05/10/2019  . Asthma    WELL CONTROLLED  . GERD (gastroesophageal reflux disease)    OCC TUMS PRN  . Headache    MIGRAINES  . History of substance abuse (HCC)       Objective/Physical Exam Neurovascular status intact.  Skin incisions appear to be well coapted with sutures and staples intact. No sign of infectious process noted. No dehiscence. No active bleeding noted. Moderate edema noted to the surgical extremity.  Assessment: 1. s/p EPF left. DOS: 11/01/2019   Plan of Care:  1. Patient was evaluated.  2. Ace bandage loosened.  3. Post op shoe dispensed. Discontinue using CAM boot.  4. Return to clinic at next scheduled post op appointment.   Works at [employer].    Felecia Shelling, DPM [facility]  Dr. Felecia Shelling, DPM    [address]                                        [place], [place] [address]                Office [phone]  Fax [phone]

## 2019-11-09 ENCOUNTER — Other Ambulatory Visit: Payer: Self-pay

## 2019-11-09 ENCOUNTER — Ambulatory Visit (INDEPENDENT_AMBULATORY_CARE_PROVIDER_SITE_OTHER): Payer: Managed Care, Other (non HMO) | Admitting: Podiatry

## 2019-11-09 DIAGNOSIS — M722 Plantar fascial fibromatosis: Secondary | ICD-10-CM

## 2019-11-09 DIAGNOSIS — Z9889 Other specified postprocedural states: Secondary | ICD-10-CM

## 2019-11-09 MED ORDER — OXYCODONE-ACETAMINOPHEN 5-325 MG PO TABS: 1.0000 | ORAL_TABLET | Freq: Four times a day (QID) | ORAL | 0 refills | Status: DC | PRN

## 2019-11-12 NOTE — Progress Notes (Signed)
   Subjective:  Patient presents today status post EPF left. DOS: 11/01/2019. She states she is doing well. She reports some mild pain but denies anything severe. She denies any worsening factors and has been taking Percocet and using the post op shoe as directed. She is also taking Meloxicam. Patient is here for further evaluation and treatment.   Past Medical History:  Diagnosis Date  . ADD (attention deficit disorder)   . Anemia   . Anxiety and depression 05/10/2019  . Asthma    WELL CONTROLLED  . GERD (gastroesophageal reflux disease)    OCC TUMS PRN  . Headache    MIGRAINES  . History of substance abuse (HCC)       Objective/Physical Exam Neurovascular status intact.  Skin incisions appear to be well coapted with sutures and staples intact. No sign of infectious process noted. No dehiscence. No active bleeding noted. Moderate edema noted to the surgical extremity.  Assessment: 1. s/p EPF left. DOS: 11/01/2019   Plan of Care:  1. Patient was evaluated. 2. Dressing changed.  3. Ace wraps provided.  4. Continue using post op shoe.  5. Refill prescription for Percocet 5/325 mg provided to patient.  6. Continue taking Meloxicam.  7. Return to clinic in one week for suture removal.   Works at [employer].    Felecia Shelling, DPM [facility]  Dr. Felecia Shelling, DPM    [address]                                        [place], [place] [address]                Office [phone]  Fax [phone]

## 2019-11-18 ENCOUNTER — Other Ambulatory Visit: Payer: Self-pay | Admitting: Obstetrics and Gynecology

## 2019-11-18 DIAGNOSIS — G4700 Insomnia, unspecified: Secondary | ICD-10-CM

## 2019-11-19 ENCOUNTER — Other Ambulatory Visit: Payer: Self-pay | Admitting: Obstetrics and Gynecology

## 2019-11-19 DIAGNOSIS — F32A Depression, unspecified: Secondary | ICD-10-CM

## 2019-11-19 DIAGNOSIS — F419 Anxiety disorder, unspecified: Secondary | ICD-10-CM

## 2019-11-20 ENCOUNTER — Other Ambulatory Visit: Payer: Self-pay

## 2019-11-20 ENCOUNTER — Ambulatory Visit (INDEPENDENT_AMBULATORY_CARE_PROVIDER_SITE_OTHER): Payer: Managed Care, Other (non HMO) | Admitting: Podiatry

## 2019-11-20 ENCOUNTER — Encounter: Payer: Self-pay | Admitting: Podiatry

## 2019-11-20 DIAGNOSIS — Z9889 Other specified postprocedural states: Secondary | ICD-10-CM

## 2019-11-20 DIAGNOSIS — M722 Plantar fascial fibromatosis: Secondary | ICD-10-CM

## 2019-11-20 MED ORDER — OXYCODONE-ACETAMINOPHEN 5-325 MG PO TABS: 1.0000 | ORAL_TABLET | Freq: Four times a day (QID) | ORAL | 0 refills | Status: DC | PRN

## 2019-11-22 NOTE — Progress Notes (Signed)
   Subjective:  Patient presents today status post EPF left. DOS: 11/01/2019. She reports some continued throbbing pain but reports some improvement. She continues to take Meloxicam daily but is out of Percocet. She denies modifying factors. She has been using the post op shoe as directed. Patient is here for further evaluation and treatment.   Past Medical History:  Diagnosis Date  . ADD (attention deficit disorder)   . Anemia   . Anxiety and depression 05/10/2019  . Asthma    WELL CONTROLLED  . GERD (gastroesophageal reflux disease)    OCC TUMS PRN  . Headache    MIGRAINES  . History of substance abuse (HCC)       Objective/Physical Exam Neurovascular status intact.  Skin incisions appear to be well coapted with sutures and staples intact. No sign of infectious process noted. No dehiscence. No active bleeding noted. Moderate edema noted to the surgical extremity.  Assessment: 1. s/p EPF left. DOS: 11/01/2019   Plan of Care:  1. Patient was evaluated. 2. Sutures removed.  3. Discontinue using post op shoe.  4. Refill prescription for Percocet 5/325 mg provided to patient.  5. Return to clinic in 4 weeks for final post op visit and return to work.   Works at [employer].    Felecia Shelling, DPM [facility]  Dr. Felecia Shelling, DPM    [address]                                        [place], [place] [address]                Office [phone]  Fax [phone]

## 2019-11-28 ENCOUNTER — Other Ambulatory Visit: Payer: Self-pay | Admitting: Podiatry

## 2019-11-28 ENCOUNTER — Telehealth: Payer: Self-pay | Admitting: Podiatry

## 2019-11-28 MED ORDER — OXYCODONE-ACETAMINOPHEN 5-325 MG PO TABS: 1.0000 | ORAL_TABLET | Freq: Four times a day (QID) | ORAL | 0 refills | Status: DC | PRN

## 2019-11-28 NOTE — Telephone Encounter (Signed)
Rx sent 

## 2019-11-28 NOTE — Progress Notes (Signed)
PRN postop 

## 2019-11-28 NOTE — Telephone Encounter (Signed)
Pt called and is needing her pain medication refill sent to the [facility] main st in [place]. She has been out for 3 days because she is having to double up since surgery.

## 2019-12-04 ENCOUNTER — Encounter: Payer: Managed Care, Other (non HMO) | Admitting: Podiatry

## 2019-12-06 ENCOUNTER — Other Ambulatory Visit: Payer: Self-pay | Admitting: Podiatry

## 2019-12-06 ENCOUNTER — Encounter: Payer: Self-pay | Admitting: Podiatry

## 2019-12-06 MED ORDER — OXYCODONE-ACETAMINOPHEN 5-325 MG PO TABS: 1.0000 | ORAL_TABLET | Freq: Three times a day (TID) | ORAL | 0 refills | Status: DC | PRN

## 2019-12-06 NOTE — Progress Notes (Signed)
PRN postop 

## 2019-12-06 NOTE — Telephone Encounter (Signed)
Refill sent. - Dr. Kol Consuegra

## 2019-12-12 ENCOUNTER — Telehealth: Payer: Self-pay

## 2019-12-12 NOTE — Telephone Encounter (Signed)
Patient is calling requesting a refill of her pain medication.   Please advise since Dr. Logan Bores is out of office

## 2019-12-13 ENCOUNTER — Ambulatory Visit: Payer: Managed Care, Other (non HMO)

## 2019-12-13 ENCOUNTER — Other Ambulatory Visit: Payer: Self-pay | Admitting: Podiatry

## 2019-12-13 ENCOUNTER — Ambulatory Visit (INDEPENDENT_AMBULATORY_CARE_PROVIDER_SITE_OTHER): Payer: Managed Care, Other (non HMO) | Admitting: Podiatry

## 2019-12-13 ENCOUNTER — Other Ambulatory Visit: Payer: Self-pay

## 2019-12-13 ENCOUNTER — Encounter: Payer: Self-pay | Admitting: Podiatry

## 2019-12-13 ENCOUNTER — Ambulatory Visit (INDEPENDENT_AMBULATORY_CARE_PROVIDER_SITE_OTHER): Payer: Managed Care, Other (non HMO)

## 2019-12-13 DIAGNOSIS — S93492A Sprain of other ligament of left ankle, initial encounter: Secondary | ICD-10-CM | POA: Diagnosis not present

## 2019-12-13 DIAGNOSIS — S99912A Unspecified injury of left ankle, initial encounter: Secondary | ICD-10-CM

## 2019-12-13 DIAGNOSIS — S93402A Sprain of unspecified ligament of left ankle, initial encounter: Secondary | ICD-10-CM

## 2019-12-13 MED ORDER — OXYCODONE-ACETAMINOPHEN 5-325 MG PO TABS: 1.0000 | ORAL_TABLET | Freq: Three times a day (TID) | ORAL | 0 refills | Status: DC | PRN

## 2019-12-13 NOTE — Telephone Encounter (Signed)
Done

## 2019-12-16 ENCOUNTER — Encounter: Payer: Self-pay | Admitting: Podiatry

## 2019-12-16 NOTE — Progress Notes (Signed)
Subjective:  Patient ID: Girtha Rm, female    DOB: 01-28-1996,  MRN: [id number]  Chief Complaint  Patient presents with  . Post-op Problem    i rolled my left ankle and it hurts and feels like it is on fire    24 y.o. female presents with the above complaint.  Patient presents with a follow-up from left EPF date of surgery 11/01/2019 that was done by Dr. Amalia Hailey.  Patient states that she actually rolled her ankle and started to hurt on the left side.  She states is a surgical site that is hurting.  She says she rolled the ankle Monday night has progressive gotten worse.  There is some soreness tenderness associated.  The incision site has also started hurting a lot more.  She states that she has tried staying off of it but has not been helping.  The ibuprofen has not been helping.  She wants to make sure t if there is any other treatment options are available.   Review of Systems: Negative except as noted in the HPI. Denies N/V/F/Ch.  Past Medical History:  Diagnosis Date  . ADD (attention deficit disorder)   . Anemia   . Anxiety and depression 05/10/2019  . Asthma    WELL CONTROLLED  . GERD (gastroesophageal reflux disease)    OCC TUMS PRN  . Headache    MIGRAINES  . History of substance abuse ([facility])     Current Outpatient Medications:  .  azelastine (ASTELIN) 0.1 % nasal spray, SMARTSIG:1-2 Spray(s) Both Nares Twice Daily, Disp: , Rfl:  .  fluticasone (FLONASE) 50 MCG/ACT nasal spray, Place 2 sprays into both nostrils daily., Disp: , Rfl:  .  HYDROcodone-acetaminophen (NORCO/VICODIN) 5-325 MG tablet, Take 1 tablet by mouth every 4 (four) hours as needed for moderate pain., Disp: 10 tablet, Rfl: 0 .  meloxicam (MOBIC) 15 MG tablet, Take 1 tablet (15 mg total) by mouth daily., Disp: 30 tablet, Rfl: 2 .  montelukast (SINGULAIR) 10 MG tablet, Take 10 mg by mouth daily., Disp: , Rfl:  .  oxyCODONE-acetaminophen (PERCOCET) 5-325 MG tablet, Take 1 tablet by mouth every 8 (eight) hours as  needed for severe pain., Disp: 20 tablet, Rfl: 0 .  predniSONE (DELTASONE) 50 MG tablet, Take 1 tablet (50 mg total) by mouth daily with breakfast., Disp: 5 tablet, Rfl: 0 .  sertraline (ZOLOFT) 100 MG tablet, TAKE 1 AND 1/2 TABLETS BY MOUTH DAILY, Disp: 135 tablet, Rfl: 2 .  traMADol (ULTRAM) 50 MG tablet, Take 1 tablet (50 mg total) by mouth every 8 (eight) hours as needed., Disp: 20 tablet, Rfl: 0 .  traZODone (DESYREL) 100 MG tablet, Take 1 tablet (100 mg total) by mouth at bedtime as needed for sleep., Disp: 30 tablet, Rfl: 2  Social History   Tobacco Use  Smoking Status Current Every Day Smoker  . Packs/day: 0.25  . Years: 10.00  . Pack years: 2.50  . Types: Cigarettes  Smokeless Tobacco Never Used    Allergies  Allergen Reactions  . Dust Mite Extract Shortness Of Breath and Swelling  . Bee Pollen Other (See Comments)  . Penicillins Hives    Did it involve swelling of the face/tongue/throat, SOB, or low BP? Unknown Did it involve sudden or severe rash/hives, skin peeling, or any reaction on the inside of your mouth or nose? No Did you need to seek medical attention at a hospital or doctor's office? No When did it last happen?Childhood allergy  . Pollen Extract   .  Amoxicillin Hives    Did it involve swelling of the face/tongue/throat, SOB, or low BP? Unknown Did it involve sudden or severe rash/hives, skin peeling, or any reaction on the inside of your mouth or nose? No Did you need to seek medical attention at a hospital or doctor's office? No When did it last happen?Childhood allergy If all above answers are "NO", may proceed with cephalosporin use.    Objective:  There were no vitals filed for this visit. There is no height or weight on file to calculate BMI. Constitutional Well developed. Well nourished.  Vascular Dorsalis pedis pulses palpable bilaterally. Posterior tibial pulses palpable bilaterally. Capillary refill normal to all digits.  No  cyanosis or clubbing noted. Pedal hair growth normal.  Neurologic Normal speech. Oriented to person, place, and time. Epicritic sensation to light touch grossly present bilaterally.  Dermatologic Nails well groomed and normal in appearance. No open wounds. No skin lesions.  Orthopedic:  Pain on palpation to the ATFL of the lateral foot.  Pain with plantarflexion inversion of the foot.  Pain with eversion resisted.  Mild pain at the portals for EPF both medial lateral side.  Tenderness at the plantar fascia at the calcaneal tuber.   Radiographs: 3 views of skeletally mature adult ankle left foot: Ankle mortise within normal limits no fracture line noted.  No other osseous abnormalities noted. Assessment:   1. Injury of left ankle, initial encounter    Plan:  Patient was evaluated and treated and all questions answered.  Left ankle sprain grade 2 -I explained patient the etiology of ankle sprain and various treatment options were extensively discussed.  It appears that patient has undergone a moderate grade 2 ankle sprain with swelling.Patient may have also injured the surgical site as well. -Patient can place her self back in the cam boot. -She will follow up with Dr. Logan Bores for transition to regular sneakers via ankle brace. -Pain medication was prescribed to be taken as needed.  No follow-ups on file.

## 2019-12-17 ENCOUNTER — Telehealth: Payer: Self-pay

## 2019-12-17 NOTE — Telephone Encounter (Signed)
Patient called requesting another refill for her pain medication.   She stated that she spoke to her pharmacist and they told her you could send in 3 different scripts for pain so she doesn't have to keep calling.  Please send in medication if you approve  Thanks!

## 2019-12-18 ENCOUNTER — Other Ambulatory Visit: Payer: Self-pay | Admitting: Podiatry

## 2019-12-18 MED ORDER — OXYCODONE-ACETAMINOPHEN 5-325 MG PO TABS: 1.0000 | ORAL_TABLET | Freq: Three times a day (TID) | ORAL | 0 refills | Status: DC | PRN

## 2019-12-18 NOTE — Telephone Encounter (Signed)
Rx sent 

## 2020-01-01 ENCOUNTER — Other Ambulatory Visit: Payer: Self-pay

## 2020-01-01 ENCOUNTER — Ambulatory Visit (INDEPENDENT_AMBULATORY_CARE_PROVIDER_SITE_OTHER): Payer: Managed Care, Other (non HMO) | Admitting: Podiatry

## 2020-01-01 DIAGNOSIS — Z9889 Other specified postprocedural states: Secondary | ICD-10-CM

## 2020-01-01 DIAGNOSIS — S93402D Sprain of unspecified ligament of left ankle, subsequent encounter: Secondary | ICD-10-CM

## 2020-01-01 DIAGNOSIS — M722 Plantar fascial fibromatosis: Secondary | ICD-10-CM

## 2020-01-01 MED ORDER — MELOXICAM 15 MG PO TABS: 15.0000 mg | ORAL_TABLET | Freq: Every day | ORAL | 3 refills | Status: DC

## 2020-01-01 MED ORDER — HYDROCODONE-ACETAMINOPHEN 5-325 MG PO TABS: 1.0000 | ORAL_TABLET | Freq: Three times a day (TID) | ORAL | 0 refills | Status: DC | PRN

## 2020-01-01 NOTE — Progress Notes (Signed)
   Subjective:  Patient presents today status post EPF of the left foot. DOS: 11/01/2019.  Patient states she is continuing to have some heel pain to the left heel.  She also states that since she saw me last she has sustained multiple ankle sprains to the left ankle.  She actually came into the office on 12/13/2019 and was seen by Dr. Allena Katz and he instructed her to resume the cam boot.  She has been wearing the cam boot since then until today's visit.  She states that the ankle has actually improved however she continues to have some residual heel pain.  Past Medical History:  Diagnosis Date  . ADD (attention deficit disorder)   . Anemia   . Anxiety and depression 05/10/2019  . Asthma    WELL CONTROLLED  . GERD (gastroesophageal reflux disease)    OCC TUMS PRN  . Headache    MIGRAINES  . History of substance abuse ([facility])       Objective/Physical Exam Neurovascular status intact.  There continues to be some tenderness to palpation along the plantar heel.  Limited dorsiflexion of the ankle joint also noted.  There is also pain with stretching of the plantar fascia. Negative for any pain on palpation along the anterior lateral aspect of the ankle joint.  Assessment: 1. s/p EPF left. DOS: 11/01/2019 2.  Ankle sprain left   Plan of Care:  1. Patient was evaluated.  2.  Prescription for Vicodin 5/325 mg every 8 hours #30 3.  Continue meloxicam daily 4.  Stressed the importance of daily stretching exercises minimum 3 times daily.  Demonstration provided today.  After stretching out her calf and plantar fascia she stated today that she actually felt much better after simply stretching here in the office. 5.  Discontinue cam boot.  Recommend good supportive tennis shoes 6.  Return to clinic in 4 weeks   Felecia Shelling, DPM [facility]  Dr. Felecia Shelling, DPM    [address]                                        [place], [place] [address]                Office [phone]  Fax [phone]

## 2020-01-02 NOTE — Progress Notes (Signed)
Virtual Visit via Telephone Note  Pt not feeling well for past wk so doesn't want to come into office.   I connected with [name] on 01/03/20 at 10:50 AM EDT by telephone and verified that I am speaking with the correct person using two identifiers.   I discussed the limitations, risks, security and privacy concerns of performing an evaluation and management service by telephone and the availability of in person appointments. I also discussed with the patient that there may be a patient responsible charge related to this service. The patient expressed understanding and agreed to proceed. Location of pt: home Location of provider: [facility] Name of CMA for history intake: Donnetta Hail    Chief Complaint  Patient presents with  . Follow-up    Anxiety/Depression    HPI:      [name] is a 24 y.o. G1P1001 who LMP was No LMP recorded., presents today for anxiety/depression f/u. Pt was up to zoloft 150 mg without sx control. Felt worse, with frequent headaches. Stopped about 2 wks ago and feels much better emotionally and feels more like herself. Anxiety is most bothersome to her. Is trying to conceive so med options limited. Was having insomnia but takes trazodone 100 mg QHS with good sleep. Not having OCD tendancies now. Would like to treat the anxiety. Needs RF on trazodone.  Also with UTI sx of LBP, frequency, urgency. Drinking a lot of Dr. Reino Kent. Hx of UTIs in past, last one 11/20.    3/21 NOTE: Hx of anxiety/ depression sx; initially seen 11/20. Started on zoloft 50 mg dose. Increased dose to 100 mg 1/21 due to persistent sx. Sx a little better but still persist. Pt then emailed 07/30/19 with issues with excessive cleaning/OCP tendencies and insomnia. Started trazodone 50 mg without relief of insomnia so tried 100 mg dose with sx relief. Hasn't seen therapist yet (as suggested), but has left messages for some to start being seen. Hx of ADHD in past as  well, didn't do well with adderall. Now pt wants to conceive so med options limited.    Past Medical History:  Diagnosis Date  . ADD (attention deficit disorder)   . Anemia   . Anxiety and depression 05/10/2019  . Asthma    WELL CONTROLLED  . GERD (gastroesophageal reflux disease)    OCC TUMS PRN  . Headache    MIGRAINES  . History of substance abuse [facility]     Past Surgical History:  Procedure Laterality Date  . FOOT SURGERY  10/2019  . MYRINGOTOMY WITH TUBE PLACEMENT Left 08/16/2018   Procedure: MYRINGOTOMY WITH TUBE PLACEMENT;  Surgeon: Geanie Logan, MD;  Location: [facility] ORS;  Service: ENT;  Laterality: Left;  . TONSILLECTOMY  2014   Pt not sure if addenoids taken  . TYMPANOPLASTY WITH GRAFT Right 08/16/2018   Procedure: TYMPANOMASTOIDECTOMY WITH POSSIBLE OSSICULAR GRAFT;  Surgeon: Geanie Logan, MD;  Location: [facility] ORS;  Service: ENT;  Laterality: Right;  . WISDOM TOOTH EXTRACTION  2014    Family History  Problem Relation Age of Onset  . Hypertension Mother   . Hypertension Father   . Hypertension Maternal Grandmother   . Diabetes Maternal Grandmother   . Thyroid disease Maternal Grandmother   . Hypertension Maternal Grandfather   . Thyroid disease Maternal Grandfather   . Hypertension Paternal Grandmother   . Thyroid disease Paternal Grandmother   . Hypertension Paternal Grandfather   . Thyroid disease Paternal Grandfather   .  Hypertension Maternal Aunt   . Hypertension Maternal Uncle   . Hypertension Paternal Aunt   . Diabetes Paternal Aunt   . Hypertension Paternal Uncle      Current Outpatient Medications:  .  fluticasone (FLONASE) 50 MCG/ACT nasal spray, Place 2 sprays into both nostrils daily., Disp: , Rfl:  .  HYDROcodone-acetaminophen (NORCO/VICODIN) 5-325 MG tablet, Take 1 tablet by mouth every 8 (eight) hours as needed for moderate pain., Disp: 30 tablet, Rfl: 0 .  meloxicam (MOBIC) 15 MG tablet, Take 1 tablet (15 mg total) by mouth daily., Disp: 30  tablet, Rfl: 3 .  montelukast (SINGULAIR) 10 MG tablet, Take 10 mg by mouth daily., Disp: , Rfl:  .  traZODone (DESYREL) 100 MG tablet, Take 1 tablet (100 mg total) by mouth at bedtime as needed for sleep., Disp: 30 tablet, Rfl: 2 .  busPIRone (BUSPAR) 7.5 MG tablet, Take 1 tablet (7.5 mg total) by mouth 2 (two) times daily., Disp: 60 tablet, Rfl: 0 .  nitrofurantoin, macrocrystal-monohydrate, (MACROBID) 100 MG capsule, Take 1 capsule (100 mg total) by mouth 2 (two) times daily for 5 days., Disp: 10 capsule, Rfl: 0 .  sertraline (ZOLOFT) 100 MG tablet, Take by mouth. (Patient not taking: Reported on 01/03/2020), Disp: , Rfl:    OBJECTIVE:    GAD 7 : Generalized Anxiety Score 01/03/2020 09/03/2019 07/02/2019 05/10/2019  Nervous, Anxious, on Edge 2 3 3 3   Control/stop worrying 3 3 3 3   Worry too much - different things 3 3 3 3   Trouble relaxing 3 3 3 3   Restless 0 3 3 2   Easily annoyed or irritable 3 3 3 3   Afraid - awful might happen 3 2 3 3   Total GAD 7 Score 17 20 21 20   Anxiety Difficulty Extremely difficult Extremely difficult Extremely difficult Very difficult    Depression screen PHQ 2/9 01/03/2020  Decreased Interest 2  Down, Depressed, Hopeless 1  PHQ - 2 Score 3  Altered sleeping 0  Tired, decreased energy 3  Change in appetite 3  Feeling bad or failure about yourself  3  Trouble concentrating 3  Moving slowly or fidgety/restless 3  Suicidal thoughts 0  PHQ-9 Score 18  Difficult doing work/chores Extremely dIfficult    Assessment/Plan: Anxiety and depression - Plan: busPIRone (BUSPAR) 7.5 MG tablet; Depression sx not bothersome although scored high on PHQ-9. Will treat the anxiety which is most bothersome. Try buspar BID. F/u in 4 wks/sooner prn. If depression sx increase, will try prozac. Wellbutrin or effexor options when pt not trying to conceive.   UTI symptoms - Plan: nitrofurantoin, macrocrystal-monohydrate, (MACROBID) 100 MG capsule; Rx macrobid. Unable to check UA since  pt doesn't want to come into office due to not feeling well. Hx of UTIs in past.  D/C Dr. and add water. Will check UA if sx persist.   Insomnia, unspecified type - Plan: traZODone (DESYREL) 100 MG tablet; Rx RF. F/u prn.    Meds ordered this encounter  Medications  . busPIRone (BUSPAR) 7.5 MG tablet    Sig: Take 1 tablet (7.5 mg total) by mouth 2 (two) times daily.    Dispense:  60 tablet    Refill:  0    Order Specific Question:   Supervising Provider    Answer:     . nitrofurantoin, macrocrystal-monohydrate, (MACROBID) 100 MG capsule    Sig: Take 1 capsule (100 mg total) by mouth 2 (two) times daily for 5 days.  Dispense:  10 capsule    Refill:  0    Order Specific Question:   Supervising Provider    Answer:   Nadara Mustard [id number]  . traZODone (DESYREL) 100 MG tablet    Sig: Take 1 tablet (100 mg total) by mouth at bedtime as needed for sleep.    Dispense:  30 tablet    Refill:  2    Order Specific Question:   Supervising Provider    Answer:   Nadara Mustard [[id number]]    I discussed the assessment and treatment plan with the patient. The patient was provided an opportunity to ask questions and all were answered. The patient agreed with the plan and demonstrated an understanding of the instructions.   The patient was advised to call back or seek an in-person evaluation if the symptoms worsen or if the condition fails to improve as anticipated.  I provided 16 minutes of non-face-to-face time during this encounter.   Winston Misner B. Destinie Thornsberry, PA-C 01/03/2020 11:05 AM

## 2020-01-03 ENCOUNTER — Encounter: Payer: Self-pay | Admitting: Obstetrics and Gynecology

## 2020-01-03 ENCOUNTER — Other Ambulatory Visit: Payer: Self-pay

## 2020-01-03 ENCOUNTER — Ambulatory Visit (INDEPENDENT_AMBULATORY_CARE_PROVIDER_SITE_OTHER): Payer: Managed Care, Other (non HMO) | Admitting: Obstetrics and Gynecology

## 2020-01-03 DIAGNOSIS — G4700 Insomnia, unspecified: Secondary | ICD-10-CM

## 2020-01-03 DIAGNOSIS — R399 Unspecified symptoms and signs involving the genitourinary system: Secondary | ICD-10-CM | POA: Diagnosis not present

## 2020-01-03 DIAGNOSIS — F32A Depression, unspecified: Secondary | ICD-10-CM

## 2020-01-03 DIAGNOSIS — F329 Major depressive disorder, single episode, unspecified: Secondary | ICD-10-CM | POA: Diagnosis not present

## 2020-01-03 DIAGNOSIS — F419 Anxiety disorder, unspecified: Secondary | ICD-10-CM

## 2020-01-03 MED ORDER — TRAZODONE HCL 100 MG PO TABS: 100.0000 mg | ORAL_TABLET | Freq: Every evening | ORAL | 2 refills | Status: DC | PRN

## 2020-01-03 MED ORDER — BUSPIRONE HCL 7.5 MG PO TABS: 7.5000 mg | ORAL_TABLET | Freq: Two times a day (BID) | ORAL | 0 refills | Status: DC

## 2020-01-03 MED ORDER — NITROFURANTOIN MONOHYD MACRO 100 MG PO CAPS: 100.0000 mg | ORAL_CAPSULE | Freq: Two times a day (BID) | ORAL | 0 refills | Status: AC

## 2020-01-03 NOTE — Patient Instructions (Signed)
I value your feedback and entrusting us with your care. If you get a [place] [facility] patient survey, I would appreciate you taking the time to let us know about your experience today. Thank you!  As of June 07, 2019, your lab results will be released to your MyChart immediately, before I even have a chance to see them. Please give me time to review them and contact you if there are any abnormalities. Thank you for your patience.  

## 2020-01-25 ENCOUNTER — Other Ambulatory Visit: Payer: Self-pay | Admitting: Obstetrics and Gynecology

## 2020-01-25 DIAGNOSIS — F419 Anxiety disorder, unspecified: Secondary | ICD-10-CM

## 2020-01-25 DIAGNOSIS — F32A Depression, unspecified: Secondary | ICD-10-CM

## 2020-01-31 ENCOUNTER — Ambulatory Visit: Payer: Managed Care, Other (non HMO) | Admitting: Obstetrics and Gynecology

## 2020-02-01 ENCOUNTER — Encounter: Payer: Managed Care, Other (non HMO) | Admitting: Podiatry

## 2020-02-01 ENCOUNTER — Encounter: Payer: Self-pay | Admitting: Obstetrics and Gynecology

## 2020-02-01 ENCOUNTER — Other Ambulatory Visit: Payer: Self-pay | Admitting: Advanced Practice Midwife

## 2020-02-01 ENCOUNTER — Telehealth: Payer: Self-pay

## 2020-02-01 ENCOUNTER — Other Ambulatory Visit: Payer: Self-pay | Admitting: Obstetrics and Gynecology

## 2020-02-01 DIAGNOSIS — F32A Depression, unspecified: Secondary | ICD-10-CM

## 2020-02-01 MED ORDER — BUSPIRONE HCL 7.5 MG PO TABS: 7.5000 mg | ORAL_TABLET | Freq: Two times a day (BID) | ORAL | 0 refills | Status: DC

## 2020-02-01 NOTE — Telephone Encounter (Signed)
Pt took her last Buspar today she has an appointment with [facility] on 8/13, can you send in a refill for her please. she does not need to go with out the medicine, [facility] [place]

## 2020-02-01 NOTE — Telephone Encounter (Signed)
Alicia sent a refill 1 week ago to the [facility] in [place]- enough for a whole month

## 2020-02-01 NOTE — Progress Notes (Signed)
Rx RF buspar until appt next wk

## 2020-02-05 ENCOUNTER — Encounter: Payer: Self-pay | Admitting: Obstetrics and Gynecology

## 2020-02-08 ENCOUNTER — Ambulatory Visit (INDEPENDENT_AMBULATORY_CARE_PROVIDER_SITE_OTHER): Payer: Managed Care, Other (non HMO) | Admitting: Obstetrics and Gynecology

## 2020-02-08 ENCOUNTER — Encounter: Payer: Self-pay | Admitting: Obstetrics and Gynecology

## 2020-02-08 ENCOUNTER — Other Ambulatory Visit: Payer: Self-pay

## 2020-02-08 ENCOUNTER — Ambulatory Visit (INDEPENDENT_AMBULATORY_CARE_PROVIDER_SITE_OTHER): Payer: Managed Care, Other (non HMO) | Admitting: Podiatry

## 2020-02-08 DIAGNOSIS — M7752 Other enthesopathy of left foot: Secondary | ICD-10-CM

## 2020-02-08 DIAGNOSIS — M722 Plantar fascial fibromatosis: Secondary | ICD-10-CM | POA: Diagnosis not present

## 2020-02-08 DIAGNOSIS — F419 Anxiety disorder, unspecified: Secondary | ICD-10-CM

## 2020-02-08 DIAGNOSIS — F329 Major depressive disorder, single episode, unspecified: Secondary | ICD-10-CM

## 2020-02-08 DIAGNOSIS — F32A Depression, unspecified: Secondary | ICD-10-CM

## 2020-02-08 MED ORDER — MELOXICAM 15 MG PO TABS: 15.0000 mg | ORAL_TABLET | Freq: Every day | ORAL | 3 refills | Status: DC

## 2020-02-08 MED ORDER — BUSPIRONE HCL 7.5 MG PO TABS: 7.5000 mg | ORAL_TABLET | Freq: Two times a day (BID) | ORAL | 1 refills | Status: DC

## 2020-02-08 NOTE — Patient Instructions (Signed)
I value your feedback and entrusting us with your care. If you get a Beth Willis patient survey, I would appreciate you taking the time to let us know about your experience today. Thank you!  As of June 07, 2019, your lab results will be released to your MyChart immediately, before I even have a chance to see them. Please give me time to review them and contact you if there are any abnormalities. Thank you for your patience.  

## 2020-02-08 NOTE — Progress Notes (Signed)
Virtual Visit via Telephone Note  I connected with [name] on 02/08/20 at 10:50 AM EDT by telephone and verified that I am speaking with the correct person using two identifiers.   I discussed the limitations, risks, security and privacy concerns of performing an evaluation and management service by telephone and the availability of in person appointments. I also discussed with the patient that there may be a patient responsible charge related to this service. The patient expressed understanding and agreed to proceed. Location of pt: in car at store Location of provider: [facility] Name of CMA for history intake: Cherylann Ratel Complaint  Patient presents with  . Follow-up    anxiety/depression    HPI:      [name] is a 24 y.o. G1P1001 who LMP was No LMP recorded., presents today for anxiety and depression f/u. Started on buspar 7.5 mg BID 7/21 for anxiety sx. Pt feels much better (even though GAD score is still high). She is not worrying as much and able to relax. Is sleeping ok, too. Depression sx much improved. Would like to cont buspar for now. Also trying to conceive still. Plans to start seeing therapist next month when insurance changes.    01/03/20 NOTE: Pt was up to zoloft 150 mg without sx control. Felt worse, with frequent headaches. Stopped about 2 wks ago and feels much better emotionally and feels more like herself. Anxiety is most bothersome to her. Is trying to conceive so med options limited. Was having insomnia but takes trazodone 100 mg QHS with good sleep. Not having OCD tendancies now. Would like to treat the anxiety. Needs RF on trazodone.  Also with UTI sx of LBP, frequency, urgency. Drinking a lot of Dr. Reino Kent. Hx of UTIs in past, last one 11/20.    3/21 NOTE: Hx of anxiety/depression sx; initially seen 11/20. Started on zoloft 50 mg dose. Increased dose to100 mg1/21 due to persistent sx. Sx a little better but still  persist. Pt then emailed 07/30/19 with issues with excessive cleaning/OCP tendencies and insomnia. Startedtrazodone 50 mgwithout relief of insomnia so tried 100 mg dose with sx relief.Hasn't seen therapist yet (as suggested), but has left messages for some to start being seen. Hx of ADHD in past as well, didn't do well with adderall. Now pt wants to conceive so med options limited.  Past Medical History:  Diagnosis Date  . ADD (attention deficit disorder)   . Anemia   . Anxiety and depression 05/10/2019  . Asthma    WELL CONTROLLED  . GERD (gastroesophageal reflux disease)    OCC TUMS PRN  . Headache    MIGRAINES  . History of substance abuse [facility])     Past Surgical History:  Procedure Laterality Date  . FOOT SURGERY  10/2019  . MYRINGOTOMY WITH TUBE PLACEMENT Left 08/16/2018   Procedure: MYRINGOTOMY WITH TUBE PLACEMENT;  Surgeon: Geanie Logan, MD;  Location: [facility] ORS;  Service: ENT;  Laterality: Left;  . TONSILLECTOMY  2014   Pt not sure if addenoids taken  . TYMPANOPLASTY WITH GRAFT Right 08/16/2018   Procedure: TYMPANOMASTOIDECTOMY WITH POSSIBLE OSSICULAR GRAFT;  Surgeon: Geanie Logan, MD;  Location: [facility] ORS;  Service: ENT;  Laterality: Right;  . WISDOM TOOTH EXTRACTION  2014    Family History  Problem Relation Age of Onset  . Hypertension Mother   . Hypertension Father   . Hypertension Maternal Grandmother   . Diabetes Maternal Grandmother   .  Thyroid disease Maternal Grandmother   . Hypertension Maternal Grandfather   . Thyroid disease Maternal Grandfather   . Hypertension Paternal Grandmother   . Thyroid disease Paternal Grandmother   . Hypertension Paternal Grandfather   . Thyroid disease Paternal Grandfather   . Hypertension Maternal Aunt   . Hypertension Maternal Uncle   . Hypertension Paternal Aunt   . Diabetes Paternal Aunt   . Hypertension Paternal Uncle      Current Outpatient Medications:  .  busPIRone (BUSPAR) 7.5 MG tablet, Take 1 tablet (7.5 mg  total) by mouth 2 (two) times daily., Disp: 180 tablet, Rfl: 1 .  fluticasone (FLONASE) 50 MCG/ACT nasal spray, Place 2 sprays into both nostrils daily., Disp: , Rfl:  .  meloxicam (MOBIC) 15 MG tablet, Take 1 tablet (15 mg total) by mouth daily., Disp: 30 tablet, Rfl: 3 .  montelukast (SINGULAIR) 10 MG tablet, Take 10 mg by mouth daily., Disp: , Rfl:  .  HYDROcodone-acetaminophen (NORCO/VICODIN) 5-325 MG tablet, Take 1 tablet by mouth every 8 (eight) hours as needed for moderate pain., Disp: 30 tablet, Rfl: 0 .  traZODone (DESYREL) 100 MG tablet, Take 1 tablet (100 mg total) by mouth at bedtime as needed for sleep. (Patient not taking: Reported on 02/08/2020), Disp: 30 tablet, Rfl: 2   OBJECTIVE:    Physical Exam  GAD 7 : Generalized Anxiety Score 02/08/2020 01/03/2020 09/03/2019 07/02/2019  Nervous, Anxious, on Edge 2 2 3 3   Control/stop worrying 3 3 3 3   Worry too much - different things 3 3 3 3   Trouble relaxing 3 3 3 3   Restless 1 0 3 3  Easily annoyed or irritable 3 3 3 3   Afraid - awful might happen 3 3 2 3   Total GAD 7 Score 18 17 20 21   Anxiety Difficulty Somewhat difficult Extremely difficult Extremely difficult Extremely difficult    Depression screen PHQ 2/9 02/08/2020  Decreased Interest 0  Down, Depressed, Hopeless 1  PHQ - 2 Score 1  Altered sleeping 0  Tired, decreased energy 1  Change in appetite 0  Feeling bad or failure about yourself  0  Trouble concentrating 1  Moving slowly or fidgety/restless 0  Suicidal thoughts 0  PHQ-9 Score 3  Difficult doing work/chores Very difficult    Assessment/Plan: Anxiety and depression - Plan: busPIRone (BUSPAR) 7.5 MG tablet; Anxiety sx improved with buspar. Rx RF. F/u prn. Depression sx improved.    Meds ordered this encounter  Medications  . busPIRone (BUSPAR) 7.5 MG tablet    Sig: Take 1 tablet (7.5 mg total) by mouth 2 (two) times daily.    Dispense:  180 tablet    Refill:  1    Pls put on hold    Order Specific  Question:   Supervising Provider    Answer:       I discussed the assessment and treatment plan with the patient. The patient was provided an opportunity to ask questions and all were answered. The patient agreed with the plan and demonstrated an understanding of the instructions.   The patient was advised to call back or seek an in-person evaluation if the symptoms worsen or if the condition fails to improve as anticipated.  I provided 9.3 minutes of non-face-to-face time during this encounter.   Marzetta Lanza B. Fenris Cauble, PA-C 02/08/2020 11:13 AM

## 2020-02-08 NOTE — Progress Notes (Signed)
   Subjective:  Patient presents today status post EPF of the left foot. DOS: 11/01/2019.  Patient states that she is doing very well and actually she does not have any heel pain at the moment.  She states that her heel is good.  She has been wearing her custom molded orthotics with significant relief.  She states that recently she has been having pain to the dorsum of the foot.  She has been stretching and exercising daily but she still has some dorsal foot pain.  Pain has been ongoing since last visit.  No new complaints at this time  Past Medical History:  Diagnosis Date  . ADD (attention deficit disorder)   . Anemia   . Anxiety and depression 05/10/2019  . Asthma    WELL CONTROLLED  . GERD (gastroesophageal reflux disease)    OCC TUMS PRN  . Headache    MIGRAINES  . History of substance abuse (HCC)     Objective: Physical Exam General: The patient is alert and oriented x3 in no acute distress.  Dermatology: Skin is cool, dry and supple bilateral lower extremities. Negative for open lesions or macerations.  Vascular: Palpable pedal pulses bilaterally. No edema or erythema noted. Capillary refill within normal limits.  Neurological: Epicritic and protective threshold grossly intact bilaterally.   Musculoskeletal Exam: All pedal and ankle joints range of motion within normal limits bilateral. Muscle strength 5/5 in all groups bilateral.  There is some tenderness to palpation along the dorsal aspect of the foot along the extensor tendons consistent with findings of an extensor tendinitis    Assessment: 1. s/p EPF left. DOS: 11/01/2019 2.  Extensor tendinitis left   Plan of Care:  1. Patient was evaluated.  2.  Refill prescription for meloxicam 15 mg daily 4.  Continue wearing custom molded orthotics 5.  Injection of 0.5 cc Celestone Soluspan injected along the extensor tendon sheath left foot 6.  Return to clinic as needed  Felecia Shelling, DPM [facility]  Dr.  Felecia Shelling, DPM    [address]                                        [place], [place] [address]                Office [phone]  Fax [phone]

## 2020-02-23 ENCOUNTER — Other Ambulatory Visit: Payer: Self-pay | Admitting: Obstetrics and Gynecology

## 2020-02-23 DIAGNOSIS — F32A Depression, unspecified: Secondary | ICD-10-CM

## 2020-04-16 ENCOUNTER — Other Ambulatory Visit: Payer: Self-pay

## 2020-04-16 ENCOUNTER — Encounter: Payer: Self-pay | Admitting: Obstetrics and Gynecology

## 2020-04-16 ENCOUNTER — Ambulatory Visit (INDEPENDENT_AMBULATORY_CARE_PROVIDER_SITE_OTHER): Payer: Managed Care, Other (non HMO) | Admitting: Obstetrics and Gynecology

## 2020-04-16 VITALS — BP 120/80 | Ht 67.0 in | Wt 208.0 lb

## 2020-04-16 DIAGNOSIS — L0231 Cutaneous abscess of buttock: Secondary | ICD-10-CM

## 2020-04-16 DIAGNOSIS — L03316 Cellulitis of umbilicus: Secondary | ICD-10-CM

## 2020-04-16 NOTE — Progress Notes (Signed)
Patient, No Pcp Per   Chief Complaint  Patient presents with  . Vaginal Exam    small lump in rectal area, painful, also has rash in belly button    HPI:      Ms. Zeda Gangwer is a 24 y.o. G1P1001 whose LMP was Patient's last menstrual period was 03/22/2020 (exact date)., presents today to f/u. Went to urgent care 04/14/20, started on doxy for umbilical cellulitis and perianal infection. Belly button improving. Had a clear, odorous d/c. Provider thought it could be due to a urachal remnant.  Also had painful lesion LT perianal area that still hurts. Pt had shaved previously and been in damp underwear. Doing sitz baths and ice. Noticed blood with wiping after BM recently. No pain/constipation. Never had this before. No sx since.   Past Medical History:  Diagnosis Date  . ADD (attention deficit disorder)   . Anemia   . Anxiety and depression 05/10/2019  . Asthma    WELL CONTROLLED  . GERD (gastroesophageal reflux disease)    OCC TUMS PRN  . Headache    MIGRAINES  . History of substance abuse [facility])     Past Surgical History:  Procedure Laterality Date  . FOOT SURGERY  10/2019  . MYRINGOTOMY WITH TUBE PLACEMENT Left 08/16/2018   Procedure: MYRINGOTOMY WITH TUBE PLACEMENT;  Surgeon: Geanie Logan, MD;  Location: [facility] ORS;  Service: ENT;  Laterality: Left;  . TONSILLECTOMY  2014   Pt not sure if addenoids taken  . TYMPANOPLASTY WITH GRAFT Right 08/16/2018   Procedure: TYMPANOMASTOIDECTOMY WITH POSSIBLE OSSICULAR GRAFT;  Surgeon: Geanie Logan, MD;  Location: [facility] ORS;  Service: ENT;  Laterality: Right;  . WISDOM TOOTH EXTRACTION  2014    Family History  Problem Relation Age of Onset  . Hypertension Mother   . Hypertension Father   . Hypertension Maternal Grandmother   . Diabetes Maternal Grandmother   . Thyroid disease Maternal Grandmother   . Hypertension Maternal Grandfather   . Thyroid disease Maternal Grandfather   . Hypertension Paternal Grandmother   . Thyroid  disease Paternal Grandmother   . Hypertension Paternal Grandfather   . Thyroid disease Paternal Grandfather   . Hypertension Maternal Aunt   . Hypertension Maternal Uncle   . Hypertension Paternal Aunt   . Diabetes Paternal Aunt   . Hypertension Paternal Uncle     Social History   Socioeconomic History  . Marital status: Single    Spouse name: Not on file  . Number of children: 0  . Years of education: 33  . Highest education level: Not on file  Occupational History  . Occupation: Conservation officer, nature    Comment: Actor  . Occupation: call center  Tobacco Use  . Smoking status: Current Every Day Smoker    Packs/day: 0.10    Years: 10.00    Pack years: 1.00    Types: Cigarettes  . Smokeless tobacco: Never Used  Vaping Use  . Vaping Use: Former  Substance and Sexual Activity  . Alcohol use: No  . Drug use: Yes    Types: Marijuana    Comment: hx of cocaine, meth, and acid use-Last used 2017  . Sexual activity: Yes    Birth control/protection: None  Other Topics Concern  . Not on file  Social History Narrative  . Not on file   Social Determinants of Health   Financial Resource Strain:   . Difficulty of Paying Living Expenses: Not on file  Food Insecurity:   .  Worried About Programme researcher, broadcasting/film/video in the Last Year: Not on file  . Ran Out of Food in the Last Year: Not on file  Transportation Needs:   . Lack of Transportation (Medical): Not on file  . Lack of Transportation (Non-Medical): Not on file  Physical Activity:   . Days of Exercise per Week: Not on file  . Minutes of Exercise per Session: Not on file  Stress:   . Feeling of Stress : Not on file  Social Connections:   . Frequency of Communication with Friends and Family: Not on file  . Frequency of Social Gatherings with Friends and Family: Not on file  . Attends Religious Services: Not on file  . Active Member of Clubs or Organizations: Not on file  . Attends Banker Meetings: Not on file  . Marital  Status: Not on file  Intimate Partner Violence:   . Fear of Current or Ex-Partner: Not on file  . Emotionally Abused: Not on file  . Physically Abused: Not on file  . Sexually Abused: Not on file    Outpatient Medications Prior to Visit  Medication Sig Dispense Refill  . albuterol (VENTOLIN HFA) 108 (90 Base) MCG/ACT inhaler Inhale into the lungs.    . busPIRone (BUSPAR) 7.5 MG tablet Take 1 tablet (7.5 mg total) by mouth 2 (two) times daily. 180 tablet 1  . doxycycline (VIBRAMYCIN) 100 MG capsule Take 100 mg by mouth 2 (two) times daily.    . fluticasone (FLONASE) 50 MCG/ACT nasal spray Place 2 sprays into both nostrils daily.    . meloxicam (MOBIC) 15 MG tablet Take 1 tablet (15 mg total) by mouth daily. 30 tablet 3  . montelukast (SINGULAIR) 10 MG tablet Take 10 mg by mouth daily.    Marland Kitchen ofloxacin (OCUFLOX) 0.3 % ophthalmic solution SMARTSIG:4 Drop(s) Left Ear Twice Daily    . traZODone (DESYREL) 100 MG tablet Take 1 tablet (100 mg total) by mouth at bedtime as needed for sleep. (Patient not taking: Reported on 04/16/2020) 30 tablet 2  . HYDROcodone-acetaminophen (NORCO/VICODIN) 5-325 MG tablet Take 1 tablet by mouth every 8 (eight) hours as needed for moderate pain. 30 tablet 0   No facility-administered medications prior to visit.     ROS:  Review of Systems  Constitutional: Negative for fever.  Gastrointestinal: Positive for blood in stool. Negative for constipation, diarrhea, nausea and vomiting.  Genitourinary: Positive for genital sores. Negative for dyspareunia, dysuria, flank pain, frequency, hematuria, urgency, vaginal bleeding, vaginal discharge and vaginal pain.  Musculoskeletal: Negative for back pain.  Skin: Negative for rash.    OBJECTIVE:   Vitals:  BP 120/80   Ht 5\' 7"  (1.702 m)   Wt 208 lb (94.3 kg)   LMP 03/22/2020 (Exact Date)   Breastfeeding No   BMI 32.58 kg/m   Physical Exam Abdominal:     Hernia: There is no hernia in the umbilical area or  ventral area.     Comments: UMBILICUS WITH MINIMAL ERYTHEMA, NO D/C  Genitourinary:    General: Normal vulva.     Rectum: External hemorrhoid present.       Assessment/Plan: Cellulitis, umbilical--sx improving on abx. Cont doxy. F/u prn. If sx recur, may need gen surg eval.   Cutaneous abscess of buttock--has drained, resolving. Warm compresses/sitz baths. Cont doxy. F/u prn.   Ext hemorrhoid--most likely cause of blood with wiping vs draining abscess. F/u prn.     Return if symptoms worsen or fail to improve.  Adyson Vanburen B. Markevious Ehmke, PA-C 04/16/2020 5:18 PM

## 2020-04-16 NOTE — Patient Instructions (Signed)
I value your feedback and entrusting us with your care. If you get a [facility] patient survey, I would appreciate you taking the time to let us know about your experience today. Thank you!  As of June 07, 2019, your lab results will be released to your MyChart immediately, before I even have a chance to see them. Please give me time to review them and contact you if there are any abnormalities. Thank you for your patience.  

## 2020-05-06 ENCOUNTER — Other Ambulatory Visit: Payer: Self-pay

## 2020-05-06 ENCOUNTER — Ambulatory Visit: Payer: Managed Care, Other (non HMO) | Admitting: Podiatry

## 2020-05-06 ENCOUNTER — Ambulatory Visit (INDEPENDENT_AMBULATORY_CARE_PROVIDER_SITE_OTHER): Payer: Managed Care, Other (non HMO) | Admitting: Podiatry

## 2020-05-06 DIAGNOSIS — M722 Plantar fascial fibromatosis: Secondary | ICD-10-CM

## 2020-05-06 MED ORDER — MELOXICAM 15 MG PO TABS: 15.0000 mg | ORAL_TABLET | Freq: Every day | ORAL | 3 refills | Status: DC

## 2020-05-06 MED ORDER — METHYLPREDNISOLONE 4 MG PO TBPK: ORAL_TABLET | ORAL | 0 refills | Status: DC

## 2020-05-06 NOTE — Progress Notes (Signed)
   Subjective: 24 y.o. female presenting for evaluation of left heel pain.  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  She does have history of EPF to the left foot.  DOS: 11/01/2019.  Patient states that over the past month she has had an increase in pain due to the increase in activity.  She presents for further treatment and evaluation   Past Medical History:  Diagnosis Date  . ADD (attention deficit disorder)   . Anemia   . Anxiety and depression 05/10/2019  . Asthma    WELL CONTROLLED  . GERD (gastroesophageal reflux disease)    OCC TUMS PRN  . Headache    MIGRAINES  . History of substance abuse (HCC)      Objective: Physical Exam General: The patient is alert and oriented x3 in no acute distress.  Dermatology: Skin is warm, dry and supple bilateral lower extremities. Negative for open lesions or macerations bilateral.   Vascular: Dorsalis Pedis and Posterior Tibial pulses palpable bilateral.  Capillary fill time is immediate to all digits.  Neurological: Epicritic and protective threshold intact bilateral.   Musculoskeletal: Tenderness to palpation to the plantar aspect of the left heel along the plantar fascia. All other joints range of motion within normal limits bilateral. Strength 5/5 in all groups bilateral.   Assessment: 1. Plantar fasciitis left foot 2.  History of EPF left.  DOS: 11/01/2019  Plan of Care:  1. Patient evaluated. 2. OTC power step insoles were provided today.  Wear either the OTC insoles or custom molded insoles that the patient has at her home.  She states that they are in a box packed up somewhere. 3. Rx for Medrol Dose Pak placed 4. Rx for Meloxicam ordered for patient. 5. Instructed patient regarding therapies and modalities at home to alleviate symptoms.  6. Return to clinic in 4 weeks.     Felecia Shelling, DPM [facility]  Dr. Felecia Shelling, DPM    [address]  [place], [place] [address]                Office [phone]  Fax [phone]

## 2020-06-03 ENCOUNTER — Other Ambulatory Visit: Payer: Self-pay

## 2020-06-03 ENCOUNTER — Ambulatory Visit (INDEPENDENT_AMBULATORY_CARE_PROVIDER_SITE_OTHER): Payer: Managed Care, Other (non HMO) | Admitting: Podiatry

## 2020-06-03 DIAGNOSIS — M722 Plantar fascial fibromatosis: Secondary | ICD-10-CM | POA: Diagnosis not present

## 2020-06-03 MED ORDER — BETAMETHASONE SOD PHOS & ACET 6 (3-3) MG/ML IJ SUSP
12.0000 mg | Freq: Once | INTRAMUSCULAR | Status: AC
  Administered 2020-06-03: 12 mg via INTRAMUSCULAR

## 2020-06-03 NOTE — Progress Notes (Signed)
   Subjective: 24 y.o. female presenting for evaluation of left heel pain.  She does have history of EPF to the left foot.  DOS: 11/01/2019.  Patient states there has been minimal improvement since last visit.  The medication did not help alleviate any of her symptoms.  She continues to wear the OTC power step insoles as instructed.  No new complaints at this time   Past Medical History:  Diagnosis Date  . ADD (attention deficit disorder)   . Anemia   . Anxiety and depression 05/10/2019  . Asthma    WELL CONTROLLED  . GERD (gastroesophageal reflux disease)    OCC TUMS PRN  . Headache    MIGRAINES  . History of substance abuse (HCC)      Objective: Physical Exam General: The patient is alert and oriented x3 in no acute distress.  Dermatology: Skin is warm, dry and supple bilateral lower extremities. Negative for open lesions or macerations bilateral.   Vascular: Dorsalis Pedis and Posterior Tibial pulses palpable bilateral.  Capillary fill time is immediate to all digits.  Neurological: Epicritic and protective threshold intact bilateral.   Musculoskeletal: Tenderness to palpation to the plantar aspect of the left heel along the plantar fascia. All other joints range of motion within normal limits bilateral. Strength 5/5 in all groups bilateral.   Assessment: 1. Plantar fasciitis left foot 2.  History of EPF left.  DOS: 11/01/2019  Plan of Care:  1. Patient evaluated. 2.  Continue meloxicam daily 3.  Continue OTC power step insoles daily 4.  Injection of 0.5 cc Celestone Soluspan injected into the plantar fascia left 5.  Return to clinic in 4 weeks  *New job working at [employer] across the street   Felecia Shelling, DPM [facility]  Dr. Felecia Shelling, DPM    [address] [place], [place] [address]                Office [phone]  Fax [phone]

## 2020-07-04 ENCOUNTER — Ambulatory Visit (INDEPENDENT_AMBULATORY_CARE_PROVIDER_SITE_OTHER): Payer: Managed Care, Other (non HMO) | Admitting: Podiatry

## 2020-07-04 ENCOUNTER — Other Ambulatory Visit: Payer: Self-pay

## 2020-07-04 ENCOUNTER — Encounter: Payer: Self-pay | Admitting: Podiatry

## 2020-07-04 DIAGNOSIS — M722 Plantar fascial fibromatosis: Secondary | ICD-10-CM | POA: Diagnosis not present

## 2020-07-04 MED ORDER — CYCLOBENZAPRINE HCL 10 MG PO TABS: 10.0000 mg | ORAL_TABLET | Freq: Three times a day (TID) | ORAL | 1 refills | Status: DC | PRN

## 2020-07-04 NOTE — Progress Notes (Signed)
   Subjective: 25 y.o. female presenting for evaluation of left heel pain.  She does have history of EPF to the left foot.  DOS: 11/01/2019.  Patient states there has been minimal improvement since last visit.  The medication did not help alleviate any of her symptoms.  She continues to wear the OTC power step insoles as instructed.  No new complaints at this time   Past Medical History:  Diagnosis Date  . ADD (attention deficit disorder)   . Anemia   . Anxiety and depression 05/10/2019  . Asthma    WELL CONTROLLED  . GERD (gastroesophageal reflux disease)    OCC TUMS PRN  . Headache    MIGRAINES  . History of substance abuse (HCC)      Objective: Physical Exam General: The patient is alert and oriented x3 in no acute distress.  Dermatology: Skin is warm, dry and supple bilateral lower extremities. Negative for open lesions or macerations bilateral.   Vascular: Dorsalis Pedis and Posterior Tibial pulses palpable bilateral.  Capillary fill time is immediate to all digits.  Neurological: Epicritic and protective threshold intact bilateral.   Musculoskeletal: Tenderness to palpation to the plantar aspect of the left heel along the plantar fascia. All other joints range of motion within normal limits bilateral. Strength 5/5 in all groups bilateral.   Assessment: 1. Plantar fasciitis left foot 2.  History of EPF left.  DOS: 11/01/2019  Plan of Care:  1. Patient evaluated. 2.  Continue meloxicam daily 3.  Continue OTC power step insoles daily 4. Rx Flexeril 10 mg 3 times daily 5.  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 Felecia Shelling, DPM    [address] [place], [place] [address]                Office [phone]  Fax [phone]

## 2020-08-24 ENCOUNTER — Other Ambulatory Visit: Payer: Self-pay | Admitting: Obstetrics and Gynecology

## 2020-08-24 DIAGNOSIS — F32A Depression, unspecified: Secondary | ICD-10-CM

## 2020-08-24 DIAGNOSIS — F419 Anxiety disorder, unspecified: Secondary | ICD-10-CM

## 2020-09-17 ENCOUNTER — Other Ambulatory Visit: Payer: Self-pay

## 2020-09-17 ENCOUNTER — Encounter: Payer: Self-pay | Admitting: Obstetrics and Gynecology

## 2020-09-17 ENCOUNTER — Ambulatory Visit (INDEPENDENT_AMBULATORY_CARE_PROVIDER_SITE_OTHER): Payer: Managed Care, Other (non HMO) | Admitting: Obstetrics and Gynecology

## 2020-09-17 ENCOUNTER — Other Ambulatory Visit (HOSPITAL_COMMUNITY)
Admission: RE | Admit: 2020-09-17 | Discharge: 2020-09-17 | Disposition: A | Discharge status: Home / Self Care | Payer: Managed Care, Other (non HMO) | Source: Ambulatory Visit | Attending: Obstetrics and Gynecology | Admitting: Obstetrics and Gynecology

## 2020-09-17 VITALS — BP 110/80 | Ht 67.0 in | Wt 212.0 lb

## 2020-09-17 DIAGNOSIS — Z3169 Encounter for other general counseling and advice on procreation: Secondary | ICD-10-CM

## 2020-09-17 DIAGNOSIS — F419 Anxiety disorder, unspecified: Secondary | ICD-10-CM | POA: Diagnosis not present

## 2020-09-17 DIAGNOSIS — Z124 Encounter for screening for malignant neoplasm of cervix: Secondary | ICD-10-CM | POA: Diagnosis not present

## 2020-09-17 DIAGNOSIS — Z01419 Encounter for gynecological examination (general) (routine) without abnormal findings: Secondary | ICD-10-CM | POA: Diagnosis not present

## 2020-09-17 DIAGNOSIS — F32A Depression, unspecified: Secondary | ICD-10-CM

## 2020-09-17 MED ORDER — BUSPIRONE HCL 10 MG PO TABS: 10.0000 mg | ORAL_TABLET | Freq: Two times a day (BID) | ORAL | 0 refills | Status: DC

## 2020-09-17 NOTE — Patient Instructions (Signed)
I value your feedback and you entrusting us with your care. If you get a [facility] patient survey, I would appreciate you taking the time to let us know about your experience today. Thank you! ? ? ?

## 2020-09-17 NOTE — Progress Notes (Addendum)
PCP:  Patient, No Pcp Per   Chief Complaint  Patient presents with  . Gynecologic Exam     HPI:      [name] is a 25 y.o. G1P1001 who LMP was Patient's last menstrual period was 09/13/2020 (exact date)., presents today for her annual examination.  Her menses are regular every 28-30 days, lasting 3 days.  Dysmenorrhea mild, improved with NSAIDs. She does not have intermenstrual bleeding.  Sex activity: single partner, contraception - none, trying to conceive for a year. Not taking PNVs. Partner is FOB of their 3 yr son. Pt has had positive urine ovulation pred kits, s/p SVD. Not taking PNVs. Is getting married 9/22 so not sure if she wants to pursue fertility eval before hand.  Last Pap: September 27, 2017  Results were: no abnormalities /neg HPV DNA   There is no FH of breast cancer. There is no FH of ovarian cancer. The patient self-breast exams.  Tobacco use: quit cigs Alcohol use: none No drug use.  Exercise: min active  She does get adequate calcium but not  Vitamin D in her diet.  Hx of anxiety/ depression sx; initially seen 11/20. Was on zoloft 150 mg but didn't help with sx and pt didn't feel well. Changed to buspar 7.5 mg BID 7/21 and was doing better at first, now doesn't feel it works as well. Feels better when she doesn't take it; has little emotion after taking it; but needs something for worry/irritability. Anxiety is biggest sx for pt. Was taking trazodone for sleep. Wants to conceive so limited in tx options. Hx of ADHD in past as well, didn't do well with adderall.   Hx of neck and back pain, due to pinched nerves. Seeing chiro with sx improvement. Needs note for insurance to cont therapy.   Past Medical History:  Diagnosis Date  . ADD (attention deficit disorder)   . Anemia   . Anxiety and depression 05/10/2019  . Asthma    WELL CONTROLLED  . GERD (gastroesophageal reflux disease)    OCC TUMS PRN  . Headache    MIGRAINES  . History of substance abuse  [facility])     Past Surgical History:  Procedure Laterality Date  . FOOT SURGERY  10/2019  . MYRINGOTOMY WITH TUBE PLACEMENT Left 08/16/2018   Procedure: MYRINGOTOMY WITH TUBE PLACEMENT;  Surgeon: Geanie Logan, MD;  Location: [facility] ORS;  Service: ENT;  Laterality: Left;  . TONSILLECTOMY  2014   Pt not sure if addenoids taken  . TYMPANOPLASTY WITH GRAFT Right 08/16/2018   Procedure: TYMPANOMASTOIDECTOMY WITH POSSIBLE OSSICULAR GRAFT;  Surgeon: Geanie Logan, MD;  Location: [facility] ORS;  Service: ENT;  Laterality: Right;  . WISDOM TOOTH EXTRACTION  2014    Family History  Problem Relation Age of Onset  . Hypertension Mother   . Hypertension Father   . Hypertension Maternal Grandmother   . Diabetes Maternal Grandmother   . Thyroid disease Maternal Grandmother   . Hypertension Maternal Grandfather   . Thyroid disease Maternal Grandfather   . Hypertension Paternal Grandmother   . Thyroid disease Paternal Grandmother   . Hypertension Paternal Grandfather   . Thyroid disease Paternal Grandfather   . Hypertension Maternal Aunt   . Hypertension Maternal Uncle   . Hypertension Paternal Aunt   . Diabetes Paternal Aunt   . Hypertension Paternal Uncle     Social History   Socioeconomic History  . Marital status: Single    Spouse name: Not on file  .  Number of children: 0  . Years of education: 50  . Highest education level: Not on file  Occupational History  . Occupation: Conservation officer, [employer]    Comment: Actor  . Occupation: call center  Tobacco Use  . Smoking status: Current Every Day Smoker    Packs/day: 0.10    Years: 10.00    Pack years: 1.00    Types: Cigarettes  . Smokeless tobacco: Never Used  Vaping Use  . Vaping Use: Former  Substance and Sexual Activity  . Alcohol use: No  . Drug use: Yes    Types: Marijuana    Comment: hx of cocaine, meth, and acid use-Last used 2017  . Sexual activity: Yes    Birth control/protection: None  Other Topics Concern  . Not on file  Social  History Narrative  . Not on file   Social Determinants of Health   Financial Resource Strain: Not on file  Food Insecurity: Not on file  Transportation Needs: Not on file  Physical Activity: Not on file  Stress: Not on file  Social Connections: Not on file  Intimate Partner Violence: Not on file     Current Outpatient Medications:  .  albuterol (VENTOLIN HFA) 108 (90 Base) MCG/ACT inhaler, Inhale into the lungs., Disp: , Rfl:  .  cyclobenzaprine (FLEXERIL) 10 MG tablet, Take 1 tablet (10 mg total) by mouth 3 (three) times daily as needed for muscle spasms., Disp: 90 tablet, Rfl: 1 .  fluticasone (FLONASE) 50 MCG/ACT nasal spray, Place 2 sprays into both nostrils daily., Disp: , Rfl:  .  meloxicam (MOBIC) 15 MG tablet, Take 1 tablet (15 mg total) by mouth daily., Disp: 30 tablet, Rfl: 3 .  montelukast (SINGULAIR) 10 MG tablet, Take 10 mg by mouth daily., Disp: , Rfl:  .  ofloxacin (OCUFLOX) 0.3 % ophthalmic solution, SMARTSIG:4 Drop(s) Left Ear Twice Daily, Disp: , Rfl:  .  busPIRone (BUSPAR) 10 MG tablet, Take 1 tablet (10 mg total) by mouth 2 (two) times daily., Disp: 180 tablet, Rfl: 0 .  traZODone (DESYREL) 100 MG tablet, Take 1 tablet (100 mg total) by mouth at bedtime as needed for sleep. (Patient not taking: No sig reported), Disp: 30 tablet, Rfl: 2     ROS:  Review of Systems  Constitutional: Negative for fatigue, fever and unexpected weight change.  Respiratory: Negative for cough, shortness of breath and wheezing.   Cardiovascular: Negative for chest pain, palpitations and leg swelling.  Gastrointestinal: Negative for blood in stool, constipation, diarrhea, nausea and vomiting.  Endocrine: Negative for cold intolerance, heat intolerance and polyuria.  Genitourinary: Negative for dyspareunia, dysuria, flank pain, frequency, genital sores, hematuria, menstrual problem, pelvic pain, urgency, vaginal bleeding, vaginal discharge and vaginal pain.  Musculoskeletal: Negative  for back pain, joint swelling and myalgias.  Skin: Negative for rash.  Neurological: Negative for dizziness, syncope, light-headedness, numbness and headaches.  Hematological: Negative for adenopathy.  Psychiatric/Behavioral: Positive for agitation and dysphoric mood. Negative for confusion, sleep disturbance and suicidal ideas. The patient is not nervous/anxious.   BREAST: No symptoms   Objective: BP 110/80   Ht 5\' 7"  (1.702 m)   Wt 212 lb (96.2 kg)   LMP 09/13/2020 (Exact Date)   BMI 33.20 kg/m    Physical Exam Constitutional:      Appearance: She is well-developed.  Genitourinary:     Vulva normal.     Right Labia: No rash, tenderness or lesions.    Left Labia: No tenderness, lesions or rash.  No vaginal discharge, erythema or tenderness.      Right Adnexa: not tender and no mass present.    Left Adnexa: not tender and no mass present.    No cervical friability or polyp.     IUD strings visualized.     Uterus is not enlarged or tender.  Breasts:     Right: No mass, nipple discharge, skin change or tenderness.     Left: No mass, nipple discharge, skin change or tenderness.    Neck:     Thyroid: No thyromegaly.  Cardiovascular:     Rate and Rhythm: Normal rate and regular rhythm.     Heart sounds: Normal heart sounds. No murmur heard.   Pulmonary:     Effort: Pulmonary effort is normal.     Breath sounds: Normal breath sounds.  Abdominal:     Palpations: Abdomen is soft.     Tenderness: There is no abdominal tenderness. There is no guarding or rebound.  Musculoskeletal:        General: Normal range of motion.     Cervical back: Normal range of motion.  Lymphadenopathy:     Cervical: No cervical adenopathy.  Neurological:     General: No focal deficit present.     Mental Status: She is alert and oriented to person, place, and time.     Cranial Nerves: No cranial nerve deficit.  Skin:    General: Skin is warm and dry.  Psychiatric:        Mood and Affect:  Mood normal.        Behavior: Behavior normal.        Thought Content: Thought content normal.        Judgment: Judgment normal.  Vitals reviewed.     Results: GAD 7 : Generalized Anxiety Score 09/17/2020 02/08/2020 01/03/2020 09/03/2019  Nervous, Anxious, on Edge 3 2 2 3   Control/stop worrying 3 3 3 3   Worry too much - different things 3 3 3 3   Trouble relaxing 2 3 3 3   Restless 2 1 0 3  Easily annoyed or irritable 3 3 3 3   Afraid - awful might happen 3 3 3 2   Total GAD 7 Score 19 18 17 20   Anxiety Difficulty Very difficult Somewhat difficult Extremely difficult Extremely difficult    Depression screen PHQ 2/9 09/17/2020  Decreased Interest 1  Down, Depressed, Hopeless 1  PHQ - 2 Score 2  Altered sleeping 0  Tired, decreased energy 2  Change in appetite 3  Feeling bad or failure about yourself  0  Trouble concentrating 3  Moving slowly or fidgety/restless 1  Suicidal thoughts 0  PHQ-9 Score 11  Difficult doing work/chores Somewhat difficult     Assessment/Plan: Encounter for annual routine gynecological examination  Cervical cancer screening - Plan: Cytology - PAP  Anxiety and depression - Plan: busPIRone (BUSPAR) 10 MG tablet; will increase buspar dose to see if she only needs it QD instead of BID. Rx eRxd. Can try prozac if sx persist. No SI  Pre-conception counseling--start PNVs. F/u with MD for fertility eval/mgmt when ready.    Meds ordered this encounter  Medications  . busPIRone (BUSPAR) 10 MG tablet    Sig: Take 1 tablet (10 mg total) by mouth 2 (two) times daily.    Dispense:  180 tablet    Refill:  0    Order Specific Question:   Supervising Provider    Answer:     GYN counsel adequate intake of calcium and vitamin D, diet and exercise     F/U  Return in about 1 year (around 09/17/2021).  Lavon Bothwell B. Taber Sweetser, PA-C 09/17/2020 3:55 PM

## 2020-09-19 ENCOUNTER — Encounter: Payer: Self-pay | Admitting: Obstetrics and Gynecology

## 2020-09-19 LAB — CYTOLOGY - PAP: Diagnosis: NEGATIVE

## 2020-10-31 ENCOUNTER — Other Ambulatory Visit: Payer: Self-pay

## 2020-10-31 ENCOUNTER — Encounter: Payer: Self-pay | Admitting: Podiatry

## 2020-10-31 ENCOUNTER — Ambulatory Visit (INDEPENDENT_AMBULATORY_CARE_PROVIDER_SITE_OTHER): Payer: Medicaid Other | Admitting: Podiatry

## 2020-10-31 DIAGNOSIS — M722 Plantar fascial fibromatosis: Secondary | ICD-10-CM | POA: Diagnosis not present

## 2020-10-31 NOTE — Progress Notes (Signed)
   Subjective: 25 y.o. female presenting for evaluation of left heel pain.  She does have history of EPF to the left foot.  DOS: 11/01/2019.  Patient states there has been minimal improvement since last visit.  The medication did not help alleviate any of her symptoms.  She continues to wear the OTC power step insoles as instructed.  No new complaints at this time   Past Medical History:  Diagnosis Date  . ADD (attention deficit disorder)   . Anemia   . Anxiety and depression 05/10/2019  . Asthma    WELL CONTROLLED  . GERD (gastroesophageal reflux disease)    OCC TUMS PRN  . Headache    MIGRAINES  . History of substance abuse (HCC)      Objective: Physical Exam General: The patient is alert and oriented x3 in no acute distress.  Dermatology: Skin is warm, dry and supple bilateral lower extremities. Negative for open lesions or macerations bilateral.   Vascular: Dorsalis Pedis and Posterior Tibial pulses palpable bilateral.  Capillary fill time is immediate to all digits.  Neurological: Epicritic and protective threshold intact bilateral.   Musculoskeletal: Tenderness to palpation to the plantar aspect of the left heel along the plantar fascia. All other joints range of motion within normal limits bilateral. Strength 5/5 in all groups bilateral.   Assessment: 1. Plantar fasciitis left foot 2.  History of EPF left.  DOS: 11/01/2019  Plan of Care:  1. Patient evaluated. 2. Today we discussed the conservative versus surgical management of the presenting pathology.  The patient now has this recurrent plantar fasciitis for almost a year now.  I explained to the patient that the more aggressive surgical procedure would be an open plantar fasciotomy with a steindler stripping type release. the patient opts for surgical management. All possible complications and details of the procedure were explained. All patient questions were answered. No guarantees were expressed or implied. 3.  Authorization for surgery was initiated today. Surgery will consist of open plantar fasciotomy left 4.  Return to clinic 1 week postop  *New job working at [employer] across the street night shift  Felecia Shelling, DPM [facility]  Dr. Felecia Shelling, DPM    [address] [place], [place] [address]                Office [phone]  Fax [phone]

## 2020-11-19 ENCOUNTER — Telehealth: Payer: Self-pay

## 2020-11-19 NOTE — Telephone Encounter (Signed)
DOS 11/27/2020  OPEN PLANTAR FASCIOTOMY LT - [id number]  [facility] MEDICIAD   NOTIFICATION/PRIOR AUTHORIZATION NUMBER CASE STATUS CASE STATUS REASON PRIMARY CARE PHYSICIAN [id number] Closed Case Was Managed And Is Now Complete - ADVANCE NOTIFY DATE/TIME ADMISSION NOTIFY DATE/TIME 11/10/2020 10:04 AM CDT - COVERAGE STATUS OVERALL COVERAGE STATUS Covered/Approved 1-1 CODE DESCRIPTION COVERAGE STATUS DECISION DATE FAC [facility] Coverage determination is reflected for the facility admission and is not a guarantee of payment for ongoing services. Covered/Approved 11/18/2020 1 28008 Fasciotomy, foot and/or toe Covered/Approved 11/18/2020

## 2020-11-27 ENCOUNTER — Encounter: Payer: Self-pay | Admitting: Podiatry

## 2020-11-27 ENCOUNTER — Other Ambulatory Visit: Payer: Self-pay | Admitting: Podiatry

## 2020-11-27 DIAGNOSIS — M722 Plantar fascial fibromatosis: Secondary | ICD-10-CM

## 2020-11-27 MED ORDER — OXYCODONE-ACETAMINOPHEN 5-325 MG PO TABS: 1.0000 | ORAL_TABLET | ORAL | 0 refills | Status: DC | PRN

## 2020-11-27 MED ORDER — IBUPROFEN 800 MG PO TABS: 800.0000 mg | ORAL_TABLET | Freq: Three times a day (TID) | ORAL | 1 refills | Status: DC

## 2020-11-27 NOTE — Progress Notes (Signed)
PRN postop 

## 2020-11-28 ENCOUNTER — Encounter: Payer: Self-pay | Admitting: Podiatry

## 2020-11-28 HISTORY — PX: FOOT SURGERY: SHX648

## 2020-12-03 ENCOUNTER — Encounter: Payer: Medicaid Other | Admitting: Podiatry

## 2020-12-04 ENCOUNTER — Other Ambulatory Visit: Payer: Self-pay

## 2020-12-05 ENCOUNTER — Ambulatory Visit (INDEPENDENT_AMBULATORY_CARE_PROVIDER_SITE_OTHER): Payer: Medicaid Other | Admitting: Podiatry

## 2020-12-05 ENCOUNTER — Other Ambulatory Visit: Payer: Self-pay

## 2020-12-05 VITALS — BP 138/93 | HR 78 | Temp 97.6°F | Resp 16

## 2020-12-05 DIAGNOSIS — Z9889 Other specified postprocedural states: Secondary | ICD-10-CM

## 2020-12-05 MED ORDER — OXYCODONE-ACETAMINOPHEN 5-325 MG PO TABS: 1.0000 | ORAL_TABLET | Freq: Four times a day (QID) | ORAL | 0 refills | Status: DC | PRN

## 2020-12-05 NOTE — Addendum Note (Signed)
Addended by: Felecia Shelling on: 12/05/2020 03:35 PM   Modules accepted: Orders

## 2020-12-05 NOTE — Progress Notes (Signed)
   Subjective:  Patient presents today status post left foot open plantar fasciectomy. DOS: 11/01/2019.  Patient states that she is doing well.  Pain is tolerable.  She is kept the dressings clean dry and intact as instructed.  No new complaints at this time  Past Medical History:  Diagnosis Date   ADD (attention deficit disorder)    Anemia    Anxiety and depression 05/10/2019   Asthma    WELL CONTROLLED   GERD (gastroesophageal reflux disease)    OCC TUMS PRN   Headache    MIGRAINES   History of substance abuse (HCC)       Objective/Physical Exam Neurovascular status intact.  Skin incisions appear to be well coapted with sutures intact. No sign of infectious process noted. No dehiscence. No active bleeding noted. Moderate edema noted to the surgical extremity.  Assessment: 1. s/p open plantar fasciectomy left. DOS: 11/27/2020   Plan of Care:  1. Patient was evaluated.  2.  Dressings changed today.  Clean dry and intact. 3.  Continue weightbearing in the cam boot with the assistance of crutches.  Recommend that she wears a night splint while sleeping.  Patient has a night splint at home 4.  Return to clinic in 2 weeks for suture removal   Felecia Shelling, DPM [facility]  Dr. Felecia Shelling, DPM    [address] [place], [place] [address]                Office [phone]  Fax [phone]

## 2020-12-12 ENCOUNTER — Telehealth: Payer: Self-pay

## 2020-12-12 ENCOUNTER — Other Ambulatory Visit: Payer: Self-pay | Admitting: Obstetrics and Gynecology

## 2020-12-12 DIAGNOSIS — F32A Depression, unspecified: Secondary | ICD-10-CM

## 2020-12-12 DIAGNOSIS — F419 Anxiety disorder, unspecified: Secondary | ICD-10-CM

## 2020-12-12 MED ORDER — OXYCODONE-ACETAMINOPHEN 5-325 MG PO TABS: 1.0000 | ORAL_TABLET | Freq: Four times a day (QID) | ORAL | 0 refills | Status: DC | PRN

## 2020-12-12 NOTE — Telephone Encounter (Signed)
Pt called and stated that she is out of pain medication because she was only able to pick up a 4 day supply on 12/05/2020 due to insurance. Pt would like to have another 4 day supply sent in to [facility] [address]. Advised pt that Dr Logan Bores was out of the office and I would forward this message to the provider on call.

## 2020-12-15 ENCOUNTER — Encounter: Payer: Medicaid Other | Admitting: Podiatry

## 2020-12-16 ENCOUNTER — Telehealth: Payer: Self-pay

## 2020-12-16 ENCOUNTER — Encounter: Payer: Medicaid Other | Admitting: Podiatry

## 2020-12-16 ENCOUNTER — Ambulatory Visit (INDEPENDENT_AMBULATORY_CARE_PROVIDER_SITE_OTHER): Payer: Medicaid Other | Admitting: Podiatry

## 2020-12-16 ENCOUNTER — Other Ambulatory Visit: Payer: Self-pay | Admitting: Obstetrics and Gynecology

## 2020-12-16 ENCOUNTER — Other Ambulatory Visit: Payer: Self-pay

## 2020-12-16 DIAGNOSIS — Z9889 Other specified postprocedural states: Secondary | ICD-10-CM

## 2020-12-16 DIAGNOSIS — F32A Depression, unspecified: Secondary | ICD-10-CM

## 2020-12-16 DIAGNOSIS — F419 Anxiety disorder, unspecified: Secondary | ICD-10-CM

## 2020-12-16 MED ORDER — BUSPIRONE HCL 10 MG PO TABS: 10.0000 mg | ORAL_TABLET | Freq: Two times a day (BID) | ORAL | 2 refills | Status: DC

## 2020-12-16 MED ORDER — OXYCODONE-ACETAMINOPHEN 5-325 MG PO TABS: 1.0000 | ORAL_TABLET | ORAL | 0 refills | Status: DC | PRN

## 2020-12-16 NOTE — Telephone Encounter (Signed)
Spoke with pt. Buspar dose increased 3/22 to 10 mg BID. Pt doing much better with increased dose. Wants to cont. Rx eRxd. F/u prn.

## 2020-12-16 NOTE — Telephone Encounter (Signed)
Patient is calling to follow up on Medication refill on anxiety medication and if she is needing to scheduled an follow up appointment. Please advise

## 2020-12-16 NOTE — Progress Notes (Signed)
   Subjective:  Patient presents today status post left foot open plantar fasciectomy. DOS: 11/28/2019.  Patient continues to be weightbearing in the cam boot as instructed.    Patient does state that she is having some midfoot pain to the left foot.  This occurred during her first procedure as well which may be an irritation from the cam boot.  Last time an injection was given and it alleviated her symptoms.  She is requesting injection today  Past Medical History:  Diagnosis Date   ADD (attention deficit disorder)    Anemia    Anxiety and depression 05/10/2019   Asthma    WELL CONTROLLED   GERD (gastroesophageal reflux disease)    OCC TUMS PRN   Headache    MIGRAINES   History of substance abuse (HCC)       Objective/Physical Exam Neurovascular status intact.  Skin incisions appear to be well coapted with sutures intact. No sign of infectious process noted. No dehiscence. No active bleeding noted. Moderate edema noted to the surgical extremity.  There is pain on palpation directly to the Lisfranc joint/TMT left foot  Assessment: 1. s/p open plantar fasciectomy left. DOS: 11/27/2020 2.  Capsulitis left foot   Plan of Care:  1. Patient was evaluated.  Sutures removed today 2.  Injection of 0.5 cc Celestone Soluspan injection to the TMT Lisfranc joint left 3.  Continue weightbearing in the cam boot with the assistance of crutches.  Recommend that she wears a night splint while sleeping.  Patient has a night splint at home 4.  Return to clinic in 3 weeks to discontinue cam boot and initiate physical therapy   Felecia Shelling, DPM [facility]  Dr. Felecia Shelling, DPM    [address] [place], [place] [address]                Office [phone]  Fax [phone]

## 2020-12-30 ENCOUNTER — Encounter: Payer: Medicaid Other | Admitting: Podiatry

## 2020-12-31 ENCOUNTER — Encounter: Payer: Medicaid Other | Admitting: Podiatry

## 2021-01-06 ENCOUNTER — Ambulatory Visit (INDEPENDENT_AMBULATORY_CARE_PROVIDER_SITE_OTHER): Payer: Medicaid Other | Admitting: Podiatry

## 2021-01-06 ENCOUNTER — Other Ambulatory Visit: Payer: Self-pay

## 2021-01-06 DIAGNOSIS — Z9889 Other specified postprocedural states: Secondary | ICD-10-CM

## 2021-01-06 NOTE — Progress Notes (Signed)
   Subjective:  Patient presents today status post left foot open plantar fasciectomy. DOS: 11/28/2019.  Patient states that she is doing very well.  She has no pain associated to the area.  Patient states that she has been wearing shoes and sandals and been on her feet for several hours with no pain or sensitivity.  She is very satisfied and has no new complaints at this time  Past Medical History:  Diagnosis Date   ADD (attention deficit disorder)    Anemia    Anxiety and depression 05/10/2019   Asthma    WELL CONTROLLED   GERD (gastroesophageal reflux disease)    OCC TUMS PRN   Headache    MIGRAINES   History of substance abuse (HCC)       Objective/Physical Exam Neurovascular status intact.  Skin incisions appear to be well coapted and healed.  No sign of infectious process noted. No dehiscence. No active bleeding noted. Moderate edema noted to the surgical extremity.   Assessment: 1. s/p open plantar fasciectomy left. DOS: 11/27/2020    Plan of Care:  1. Patient was evaluated.   2.  Patient states that she is feeling well and has no pain.  She is able to stand in regular shoes for 2-4 hours with no pain or symptoms 3.  Recommend good supportive shoes and sneakers 4.  Patient may slowly increase to full activity no restrictions 5.  Return to clinic as needed   Felecia Shelling, DPM [facility]  Dr. Felecia Shelling, DPM    [address] [place], [place] [address]                Office [phone]  Fax [phone]

## 2021-02-17 ENCOUNTER — Encounter: Payer: Self-pay | Admitting: Obstetrics and Gynecology

## 2021-02-17 DIAGNOSIS — F988 Other specified behavioral and emotional disorders with onset usually occurring in childhood and adolescence: Secondary | ICD-10-CM

## 2021-04-02 ENCOUNTER — Ambulatory Visit (INDEPENDENT_AMBULATORY_CARE_PROVIDER_SITE_OTHER): Payer: Medicaid Other | Admitting: Obstetrics and Gynecology

## 2021-04-02 ENCOUNTER — Other Ambulatory Visit: Payer: Self-pay

## 2021-04-02 ENCOUNTER — Encounter: Payer: Self-pay | Admitting: Obstetrics and Gynecology

## 2021-04-02 VITALS — BP 110/70 | Ht 66.0 in | Wt 218.0 lb

## 2021-04-02 DIAGNOSIS — L732 Hidradenitis suppurativa: Secondary | ICD-10-CM

## 2021-04-02 DIAGNOSIS — Z319 Encounter for procreative management, unspecified: Secondary | ICD-10-CM | POA: Diagnosis not present

## 2021-04-02 NOTE — Progress Notes (Addendum)
Patient, No Pcp Per (Inactive)   Chief Complaint  Patient presents with   Vaginal Exam    2 cysts in vag area, painful x couple of days    HPI:      [name] is a 24 y.o. G1P1001 whose LMP was Patient's last menstrual period was 03/29/2021 (exact date)., presents today for cysts in the vaginal area for a few days, very painful. One has drained and is now bleeding. There are 2 on RT inner thigh/inguinal area and 1 on LT inner thigh. Hx of "boils" in past on legs/mons but these are more painful due to location. Had used warm compresses/sitz baths. No fevers.  Has been trying to get pregnant for over 1 1/2 yrs. Has monthly menses and ovulation confirmed with urine ovulation pred kit. Husband is FOB of 22 yo son.  Pt seeing psych today for ADD eval. Anxiety improved with buspar but almost too sedated.   Past Medical History:  Diagnosis Date   ADD (attention deficit disorder)    Anemia    Anxiety and depression 05/10/2019   Asthma    WELL CONTROLLED   GERD (gastroesophageal reflux disease)    OCC TUMS PRN   Headache    MIGRAINES   History of substance abuse [facility])     Past Surgical History:  Procedure Laterality Date   FOOT SURGERY  10/2019   FOOT SURGERY  11/28/2020   MYRINGOTOMY WITH TUBE PLACEMENT Left 08/16/2018   Procedure: MYRINGOTOMY WITH TUBE PLACEMENT;  Surgeon: Beth Canterbury, MD;  Location: [facility] ORS;  Service: ENT;  Laterality: Left;   TONSILLECTOMY  2014   Pt not sure if addenoids taken   TYMPANOPLASTY WITH GRAFT Right 08/16/2018   Procedure: TYMPANOMASTOIDECTOMY WITH POSSIBLE OSSICULAR GRAFT;  Surgeon: Beth Canterbury, MD;  Location: [facility] ORS;  Service: ENT;  Laterality: Right;   [place] EXTRACTION  2014    Family History  Problem Relation Age of Onset   Hypertension Mother    Hypertension Father    Hypertension Maternal Aunt    Hypertension Maternal Uncle    Hypertension Paternal Aunt    Diabetes Paternal Aunt    Hypertension Paternal Uncle     Hypertension Maternal Grandmother    Diabetes Maternal Grandmother    Thyroid disease Maternal Grandmother    Hypertension Maternal Grandfather    Thyroid disease Maternal Grandfather    Hypertension Paternal Grandmother    Thyroid disease Paternal Grandmother    Hypertension Paternal Grandfather    Thyroid disease Paternal Grandfather     Social History   Socioeconomic History   Marital status: Married    Spouse name: Not on file   Number of children: 0   Years of education: 12   Highest education level: Not on file  Occupational History   Occupation: Scientist, water quality    Comment: Advertising copywriter   Occupation: call center  Tobacco Use   Smoking status: Every Day    Packs/day: 0.10    Years: 10.00    Pack years: 1.00    Types: Cigarettes   Smokeless tobacco: Never  Vaping Use   Vaping Use: Former  Substance and Sexual Activity   Alcohol use: No   Drug use: Yes    Types: Marijuana    Comment: hx of cocaine, meth, and acid use-Last used 2017   Sexual activity: Yes    Birth control/protection: None  Other Topics Concern   Not on file  Social History Narrative   Not on file  Social Determinants of Health   Financial Resource Strain: Not on file  Food Insecurity: Not on file  Transportation Needs: Not on file  Physical Activity: Not on file  Stress: Not on file  Social Connections: Not on file  Intimate Partner Violence: Not on file    Outpatient Medications Prior to Visit  Medication Sig Dispense Refill   busPIRone (BUSPAR) 10 MG tablet Take 1 tablet (10 mg total) by mouth 2 (two) times daily. 180 tablet 2   cyclobenzaprine (FLEXERIL) 10 MG tablet Take 1 tablet (10 mg total) by mouth 3 (three) times daily as needed for muscle spasms. 90 tablet 1   fluticasone (FLONASE) 50 MCG/ACT nasal spray Place 2 sprays into both nostrils daily.     meloxicam (MOBIC) 15 MG tablet Take 1 tablet (15 mg total) by mouth daily. 30 tablet 3   montelukast (SINGULAIR) 10 MG tablet Take 10 mg  by mouth daily.     traZODone (DESYREL) 100 MG tablet Take 1 tablet (100 mg total) by mouth at bedtime as needed for sleep. 30 tablet 2   ibuprofen (ADVIL) 800 MG tablet Take 1 tablet (800 mg total) by mouth 3 (three) times daily. 90 tablet 1   ofloxacin (OCUFLOX) 0.3 % ophthalmic solution SMARTSIG:4 Drop(s) Left Ear Twice Daily     oxyCODONE-acetaminophen (PERCOCET) 5-325 MG tablet Take 1 tablet by mouth every 4 (four) hours as needed for severe pain. 20 tablet 0   No facility-administered medications prior to visit.      ROS:  Review of Systems  Constitutional:  Negative for fever.  Gastrointestinal:  Negative for blood in stool, constipation, diarrhea, nausea and vomiting.  Genitourinary:  Positive for genital sores. Negative for dyspareunia, dysuria, flank pain, frequency, hematuria, urgency, vaginal bleeding, vaginal discharge and vaginal pain.  Musculoskeletal:  Negative for back pain.  Skin:  Negative for rash.  BREAST: No symptoms   OBJECTIVE:   Vitals:  BP 110/70   Ht _0  (1.676 m)   Wt 218 lb (98.9 kg)   LMP 03/29/2021 (Exact Date)   BMI 35.19 kg/m   Physical Exam Constitutional:      Appearance: Normal appearance.  Pulmonary:     Effort: Pulmonary effort is normal.  Genitourinary:    Labia:        Right: Tenderness and lesion present. No rash.        Left: Tenderness and lesion present. No rash.        Comments: SEVERAL RESOLVED LESIONS ON INNER THIGHS AND MONS WITH SCARRING, C/W HS Musculoskeletal:        General: Normal range of motion.  Neurological:     Mental Status: She is alert and oriented to person, place, and time.  Psychiatric:        Judgment: Judgment normal.    Assessment/Plan: Hidradenitis suppurativa--lesions don't require abx tx today. Sitz baths/warm compresses. Dial soap, don't shave, use trimmers instead; abx tx prn. HS discussed; oral abx preventive treatment are not recommended in pregnancy. F/u prn. Can see derm if sx persist.    Infertility management--f/u with MD for further mgmt.     Return if symptoms worsen or fail to improve.  Beth Kamara B. Margherita Collyer, PA-C 04/02/2021 2:21 PM

## 2021-04-02 NOTE — Patient Instructions (Signed)
I value your feedback and you entrusting us with your care. If you get a [facility] patient survey, I would appreciate you taking the time to let us know about your experience today. Thank you! ? ? ?

## 2021-04-03 ENCOUNTER — Ambulatory Visit: Payer: Medicaid Other | Admitting: Podiatry

## 2021-04-07 ENCOUNTER — Encounter: Payer: Self-pay | Admitting: Obstetrics and Gynecology

## 2021-04-07 ENCOUNTER — Other Ambulatory Visit: Payer: Self-pay | Admitting: Obstetrics and Gynecology

## 2021-04-07 MED ORDER — DOXYCYCLINE HYCLATE 100 MG PO CAPS: 100.0000 mg | ORAL_CAPSULE | Freq: Two times a day (BID) | ORAL | 0 refills | Status: AC

## 2021-04-07 NOTE — Progress Notes (Signed)
Rx doxy for vulvar abscess

## 2021-04-13 ENCOUNTER — Encounter: Payer: Self-pay | Admitting: Obstetrics and Gynecology

## 2021-04-13 ENCOUNTER — Ambulatory Visit (INDEPENDENT_AMBULATORY_CARE_PROVIDER_SITE_OTHER): Payer: Medicaid Other | Admitting: Obstetrics and Gynecology

## 2021-04-13 ENCOUNTER — Other Ambulatory Visit: Payer: Self-pay | Admitting: Podiatry

## 2021-04-13 ENCOUNTER — Other Ambulatory Visit: Payer: Self-pay

## 2021-04-13 VITALS — BP 126/84 | Ht 67.0 in | Wt 222.0 lb

## 2021-04-13 DIAGNOSIS — E669 Obesity, unspecified: Secondary | ICD-10-CM | POA: Diagnosis not present

## 2021-04-13 DIAGNOSIS — Z6834 Body mass index (BMI) 34.0-34.9, adult: Secondary | ICD-10-CM

## 2021-04-13 DIAGNOSIS — R6889 Other general symptoms and signs: Secondary | ICD-10-CM

## 2021-04-13 DIAGNOSIS — Z319 Encounter for procreative management, unspecified: Secondary | ICD-10-CM | POA: Diagnosis not present

## 2021-04-13 DIAGNOSIS — Z131 Encounter for screening for diabetes mellitus: Secondary | ICD-10-CM
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
## 2021-04-17 ENCOUNTER — Ambulatory Visit: Payer: Medicaid Other | Admitting: Podiatry

## 2021-04-19 LAB — 17-HYDROXYPROGESTERONE: 17-Hydroxyprogesterone: 98 ng/dL

## 2021-04-19 LAB — ANDROSTENEDIONE: Androstenedione: 152 ng/dL (ref 41–262)

## 2021-04-19 LAB — PROLACTIN: Prolactin: 13.8 ng/mL (ref 4.8–23.3)

## 2021-04-19 LAB — TESTOSTERONE,FREE AND TOTAL
Testosterone, Free: 3.4 pg/mL (ref 0.0–4.2)
Testosterone: 32 ng/dL (ref 13–71)

## 2021-04-19 LAB — HEMOGLOBIN A1C
Est. average glucose Bld gHb Est-mCnc: 117 mg/dL
Hgb A1c MFr Bld: 5.7 % — ABNORMAL HIGH (ref 4.8–5.6)

## 2021-04-19 LAB — FSH/LH
FSH: 4.6 m[IU]/mL
LH: 10.4 m[IU]/mL

## 2021-04-19 LAB — DHEA-SULFATE: DHEA-SO4: 184 ug/dL (ref 84.8–378.0)

## 2021-04-19 LAB — TSH: TSH: 5.25 u[IU]/mL — ABNORMAL HIGH (ref 0.450–4.500)

## 2021-04-21 ENCOUNTER — Other Ambulatory Visit: Payer: Self-pay

## 2021-04-21 ENCOUNTER — Other Ambulatory Visit: Payer: Self-pay | Admitting: Obstetrics and Gynecology

## 2021-04-21 ENCOUNTER — Other Ambulatory Visit: Payer: Medicaid Other

## 2021-04-21 DIAGNOSIS — N979 Female infertility, unspecified: Secondary | ICD-10-CM

## 2021-04-21 DIAGNOSIS — R7989 Other specified abnormal findings of blood chemistry: Secondary | ICD-10-CM

## 2021-04-21 NOTE — Telephone Encounter (Signed)
Called and left voicemail for patient to call back to be scheduled. 

## 2021-04-21 NOTE — Telephone Encounter (Signed)
-----   Message from Vena Austria, MD sent at 04/21/2021  9:41 AM EDT ----- Need to come in to have a thyroid panel drawn order is in

## 2021-04-21 NOTE — Progress Notes (Signed)
Need to come in to have a thyroid panel drawn order is in

## 2021-04-22 LAB — THYROID PANEL WITH TSH
Free Thyroxine Index: 1.9 (ref 1.2–4.9)
T3 Uptake Ratio: 27 % (ref 24–39)
T4, Total: 7 ug/dL (ref 4.5–12.0)
TSH: 2.86 u[IU]/mL (ref 0.450–4.500)

## 2021-04-24 ENCOUNTER — Telehealth: Payer: Self-pay

## 2021-04-24 NOTE — Telephone Encounter (Addendum)
Rec'd call from pt stating that she started her period late last night/early this morning and would like to schedule the HSG. Please place orders and is 11/1 to soon to do study? She adv that cycle normally lasts 3-5 days.

## 2021-04-28 ENCOUNTER — Ambulatory Visit: Payer: Medicaid Other | Admitting: Podiatry

## 2021-04-28 ENCOUNTER — Telehealth: Payer: Self-pay

## 2021-04-28 ENCOUNTER — Other Ambulatory Visit: Payer: Self-pay | Admitting: Obstetrics and Gynecology

## 2021-04-28 ENCOUNTER — Telehealth: Payer: Self-pay | Admitting: *Deleted

## 2021-04-28 DIAGNOSIS — N979 Female infertility, unspecified: Secondary | ICD-10-CM

## 2021-04-28 NOTE — Telephone Encounter (Signed)
TC from pt asking about HSG order/appt. She called last Friday to advise that she started her cycle. A msg was sent to [facility] requesting the order.   Pt said that she is no longer on her period. I told her that the test is ideally done between day 7-10 of the cycle. (11/3 or 11/4 for her).  I told her he was not in the office but that I would send him a note.

## 2021-04-28 NOTE — Telephone Encounter (Signed)
I am returning your call.  "I don't remember."  You had concerns about your co-pay.  "I'm not worried about it.  I have an appointment on this afternoon."

## 2021-04-28 NOTE — Telephone Encounter (Signed)
Order is in, not sure if we can make it happen this Friday 05/01/2021 otherwise we'll need to wait till next cycle but I'm off tomorrow and in [place] on Thursday

## 2021-04-28 NOTE — Progress Notes (Signed)
infer

## 2021-04-29 NOTE — Telephone Encounter (Signed)
I called pt to advise that I scheduled her HSG for Friday, 05/01/21 because I did not want to risk availability in radiology. I told her I was going to have to discuss if having the study done was possible due to [facility] clinic schedule.   She said that she will just wait until next cycle to have study done since he was completely booked.  I advised her to call me when she starts and I can go ahead and schedule her since the order is in the system

## 2021-05-01 ENCOUNTER — Ambulatory Visit: Payer: Managed Care, Other (non HMO)

## 2021-05-11 ENCOUNTER — Other Ambulatory Visit: Payer: Medicaid Other

## 2021-05-11 ENCOUNTER — Other Ambulatory Visit: Payer: Self-pay

## 2021-05-11 ENCOUNTER — Other Ambulatory Visit: Payer: Self-pay | Admitting: Obstetrics and Gynecology

## 2021-05-11 DIAGNOSIS — N926 Irregular menstruation, unspecified: Secondary | ICD-10-CM

## 2021-05-11 NOTE — Telephone Encounter (Signed)
Patient is scheduled for 05/11/21 for labs

## 2021-05-11 NOTE — Progress Notes (Signed)
Serum beta for late menses. Neg UPT

## 2021-05-12 ENCOUNTER — Ambulatory Visit: Payer: Medicaid Other | Admitting: Podiatry

## 2021-05-12 ENCOUNTER — Telehealth: Payer: Self-pay | Admitting: Podiatry

## 2021-05-12 LAB — BETA HCG QUANT (REF LAB): hCG Quant: <1 m[IU]/mL

## 2021-05-12 NOTE — Telephone Encounter (Signed)
Patient called she has an appointmemnt today but cant come due to her son having RSV. Patient missed her last 2 appts and she needs to talk to you. Patient was wondering if you could call her. Pt tele number [phone].

## 2021-05-25 ENCOUNTER — Encounter: Payer: Self-pay | Admitting: Obstetrics and Gynecology

## 2021-05-25 ENCOUNTER — Other Ambulatory Visit: Payer: Self-pay

## 2021-05-25 ENCOUNTER — Ambulatory Visit: Payer: Medicaid Other | Admitting: Obstetrics and Gynecology

## 2021-05-25 VITALS — BP 128/74 | Ht 65.0 in | Wt 222.0 lb

## 2021-05-25 DIAGNOSIS — R102 Pelvic and perineal pain: Secondary | ICD-10-CM

## 2021-05-25 DIAGNOSIS — N911 Secondary amenorrhea: Secondary | ICD-10-CM | POA: Diagnosis not present

## 2021-05-25 MED ORDER — MEDROXYPROGESTERONE ACETATE 10 MG PO TABS: 10.0000 mg | ORAL_TABLET | Freq: Every day | ORAL | 0 refills | Status: DC
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
## 2021-06-03 ENCOUNTER — Other Ambulatory Visit: Payer: Self-pay

## 2021-06-03 ENCOUNTER — Ambulatory Visit
Admission: RE | Admit: 2021-06-03 | Discharge: 2021-06-03 | Disposition: A | Discharge status: Home / Self Care | Payer: Medicaid Other | Source: Ambulatory Visit | Attending: Obstetrics and Gynecology | Admitting: Obstetrics and Gynecology

## 2021-06-03 DIAGNOSIS — N911 Secondary amenorrhea: Secondary | ICD-10-CM | POA: Insufficient documentation

## 2021-06-03 DIAGNOSIS — R102 Pelvic and perineal pain: Secondary | ICD-10-CM | POA: Insufficient documentation

## 2021-06-10 ENCOUNTER — Encounter: Payer: Self-pay | Admitting: Obstetrics and Gynecology

## 2021-06-11 ENCOUNTER — Encounter: Payer: Self-pay | Admitting: Obstetrics and Gynecology

## 2021-06-11 ENCOUNTER — Other Ambulatory Visit: Payer: Self-pay

## 2021-06-11 ENCOUNTER — Ambulatory Visit (INDEPENDENT_AMBULATORY_CARE_PROVIDER_SITE_OTHER): Payer: Medicaid Other | Admitting: Obstetrics and Gynecology

## 2021-06-11 VITALS — BP 133/85 | Ht 65.0 in | Wt 222.0 lb

## 2021-06-11 DIAGNOSIS — R102 Pelvic and perineal pain: Secondary | ICD-10-CM

## 2021-06-11 NOTE — Progress Notes (Signed)
Gynecology Ultrasound Follow Up  Chief Complaint:  Chief Complaint  Patient presents with   Follow-up    TVUS - Procedure RM     History of Present Illness: Patient is a 25 y.o. female who presents today for ultrasound evaluation of pelvic pain.  Ultrasound demonstrates the following findgins Adnexa: no masses seen  Uterus: None enlarged, no evidence of uterine masses with endometrial stripe  normal without focal abnormalities Additional: no free fluid  Review of Systems: Review of Systems  Constitutional: Negative.   Gastrointestinal: Negative.   Genitourinary: Negative.    Past Medical History:  Past Medical History:  Diagnosis Date   ADD (attention deficit disorder)    Anemia    Anxiety and depression 05/10/2019   Asthma    WELL CONTROLLED   GERD (gastroesophageal reflux disease)    OCC TUMS PRN   Headache    MIGRAINES   History of substance abuse [facility])     Past Surgical History:  Past Surgical History:  Procedure Laterality Date   FOOT SURGERY  10/2019   FOOT SURGERY  11/28/2020   MYRINGOTOMY WITH TUBE PLACEMENT Left 08/16/2018   Procedure: MYRINGOTOMY WITH TUBE PLACEMENT;  Surgeon: Geanie Logan, MD;  Location: [facility] ORS;  Service: ENT;  Laterality: Left;   TONSILLECTOMY  2014   Pt not sure if addenoids taken   TYMPANOPLASTY WITH GRAFT Right 08/16/2018   Procedure: TYMPANOMASTOIDECTOMY WITH POSSIBLE OSSICULAR GRAFT;  Surgeon: Geanie Logan, MD;  Location: [facility] ORS;  Service: ENT;  Laterality: Right;   WISDOM TOOTH EXTRACTION  2014    Gynecologic History:  Patient's last menstrual period was 05/23/2021.  Family History:  Family History  Problem Relation Age of Onset   Hypertension Mother    Hypertension Father    Hypertension Maternal Aunt    Hypertension Maternal Uncle    Hypertension Paternal Aunt    Diabetes Paternal Aunt    Hypertension Paternal Uncle    Hypertension Maternal Grandmother    Diabetes Maternal Grandmother    Thyroid disease  Maternal Grandmother    Hypertension Maternal Grandfather    Thyroid disease Maternal Grandfather    Hypertension Paternal Grandmother    Thyroid disease Paternal Grandmother    Hypertension Paternal Grandfather    Thyroid disease Paternal Grandfather     Social History:  Social History   Socioeconomic History   Marital status: Married    Spouse name: Not on file   Number of children: 0   Years of education: 12   Highest education level: Not on file  Occupational History   Occupation: Conservation officer, nature    Comment: Actor   Occupation: call center  Tobacco Use   Smoking status: Every Day    Packs/day: 0.10    Years: 10.00    Pack years: 1.00    Types: Cigarettes   Smokeless tobacco: Never  Vaping Use   Vaping Use: Former  Substance and Sexual Activity   Alcohol use: No   Drug use: Yes    Types: Marijuana    Comment: hx of cocaine, meth, and acid use-Last used 2017   Sexual activity: Yes    Birth control/protection: None  Other Topics Concern   Not on file  Social History Narrative   Not on file   Social Determinants of Health   Financial Resource Strain: Not on file  Food Insecurity: Not on file  Transportation Needs: Not on file  Physical Activity: Not on file  Stress: Not on file  Social Connections:  Not on file  Intimate Partner Violence: Not on file    Allergies:  Allergies  Allergen Reactions   Dust Mite Extract Shortness Of Breath and Swelling   Bee Pollen Other (See Comments)   Penicillins Hives    Did it involve swelling of the face/tongue/throat, SOB, or low BP? Unknown Did it involve sudden or severe rash/hives, skin peeling, or any reaction on the inside of your mouth or nose? No Did you need to seek medical attention at a hospital or doctor's office? No When did it last happen?      Childhood allergy   Pollen Extract    Amoxicillin Hives    Did it involve swelling of the face/tongue/throat, SOB, or low BP? Unknown Did it involve sudden or severe  rash/hives, skin peeling, or any reaction on the inside of your mouth or nose? No Did you need to seek medical attention at a hospital or doctor's office? No When did it last happen?      Childhood allergy If all above answers are NO, may proceed with cephalosporin use.     Medications: Prior to Admission medications   Medication Sig Start Date End Date Taking? Authorizing Provider  busPIRone (BUSPAR) 10 MG tablet Take 1 tablet (10 mg total) by mouth 2 (two) times daily. 12/16/20   Copland, Ilona Sorrel, PA-C  clindamycin (CLEOCIN) 300 MG capsule Take 300 mg by mouth 3 (three) times daily. 05/22/21   [provider]  cyclobenzaprine (FLEXERIL) 10 MG tablet Take 1 tablet (10 mg total) by mouth 3 (three) times daily as needed for muscle spasms. 07/04/20   Felecia Shelling, DPM  fluticasone (FLONASE) 50 MCG/ACT nasal spray Place 2 sprays into both nostrils daily. 11/22/18   [provider]  medroxyPROGESTERone (PROVERA) 10 MG tablet Take 1 tablet (10 mg total) by mouth daily. Use for ten days 05/25/21   Vena Austria, MD  meloxicam (MOBIC) 15 MG tablet TAKE 1 TABLET BY MOUTH EVERY DAY 04/13/21   Felecia Shelling, DPM  montelukast (SINGULAIR) 10 MG tablet Take 10 mg by mouth daily. 03/20/19   [provider]    Physical Exam Vitals: Blood pressure 133/85, height 5\' 5"  (1.651 m), weight 222 lb (100.7 kg), last menstrual period 05/23/2021.  General: NAD HEENT: normocephalic, anicteric Pulmonary: No increased work of breathing Extremities: no edema, erythema, or tenderness Neurologic: Grossly intact, normal gait Psychiatric: mood appropriate, affect full   Assessment: 25 y.o. G1P1001 follow up for pelvic pain   Plan: Problem List Items Addressed This Visit   None   1) Normal TVUS  2) Did have appropriate withdrawal bleed on 10 day course of provera.  Call if no menses by cycle day 35 (LMP 06/05/21 so 07/08/21).    3) Return call if no menses by  07/08/21.    09/05/21, MD, [facility], [facility] 06/11/2021, 11:57 AM

## 2021-06-30 ENCOUNTER — Encounter: Payer: Self-pay | Admitting: Podiatry

## 2021-06-30 ENCOUNTER — Other Ambulatory Visit: Payer: Self-pay

## 2021-06-30 ENCOUNTER — Ambulatory Visit: Payer: Medicaid Other | Admitting: Podiatry

## 2021-06-30 DIAGNOSIS — G5792 Unspecified mononeuropathy of left lower limb: Secondary | ICD-10-CM | POA: Diagnosis not present

## 2021-06-30 MED ORDER — METHYLPREDNISOLONE 4 MG PO TBPK: ORAL_TABLET | ORAL | 0 refills | Status: DC

## 2021-06-30 MED ORDER — GABAPENTIN 100 MG PO CAPS: 100.0000 mg | ORAL_CAPSULE | Freq: Three times a day (TID) | ORAL | 1 refills | Status: DC

## 2021-06-30 NOTE — Progress Notes (Signed)
HPI: 26 y.o. female presenting today for new complaint regarding pain and tenderness to the left lower extremity.  Patient states that developed 6 weeks ago before Thanksgiving she went to a trampoline park.  After jumping for a few time she noticed numbness with pins-and-needles going down the lateral aspect of her left leg.  It has been intermittent and consistent ever since the injury.  She presents for further treatment and evaluation  Past Medical History:  Diagnosis Date   ADD (attention deficit disorder)    Anemia    Anxiety and depression 05/10/2019   Asthma    WELL CONTROLLED   GERD (gastroesophageal reflux disease)    OCC TUMS PRN   Headache    MIGRAINES   History of substance abuse [facility])     Past Surgical History:  Procedure Laterality Date   FOOT SURGERY  10/2019   FOOT SURGERY  11/28/2020   MYRINGOTOMY WITH TUBE PLACEMENT Left 08/16/2018   Procedure: MYRINGOTOMY WITH TUBE PLACEMENT;  Surgeon: Geanie Logan, MD;  Location: [facility] ORS;  Service: ENT;  Laterality: Left;   TONSILLECTOMY  2014   Pt not sure if addenoids taken   TYMPANOPLASTY WITH GRAFT Right 08/16/2018   Procedure: TYMPANOMASTOIDECTOMY WITH POSSIBLE OSSICULAR GRAFT;  Surgeon: Geanie Logan, MD;  Location: [facility] ORS;  Service: ENT;  Laterality: Right;   WISDOM TOOTH EXTRACTION  2014    Allergies  Allergen Reactions   Dust Mite Extract Shortness Of Breath and Swelling   Bee Pollen Other (See Comments)   Penicillins Hives    Did it involve swelling of the face/tongue/throat, SOB, or low BP? Unknown Did it involve sudden or severe rash/hives, skin peeling, or any reaction on the inside of your mouth or nose? No Did you need to seek medical attention at a hospital or doctor's office? No When did it last happen?      Childhood allergy   Pollen Extract    Amoxicillin Hives    Did it involve swelling of the face/tongue/throat, SOB, or low BP? Unknown Did it involve sudden or severe rash/hives, skin peeling,  or any reaction on the inside of your mouth or nose? No Did you need to seek medical attention at a hospital or doctor's office? No When did it last happen?      Childhood allergy If all above answers are NO, may proceed with cephalosporin use.      Physical Exam: General: The patient is alert and oriented x3 in no acute distress.  Dermatology: Skin is warm, dry and supple bilateral lower extremities. Negative for open lesions or macerations.  Vascular: Palpable pedal pulses bilaterally. Capillary refill within normal limits.  Negative for any significant edema or erythema  Neurological: Light touch and protective threshold grossly intact.  There is some paresthesia with light touch noted along the lateral aspect of the leg and foot.  Coursing the superficial peroneal nerve  Musculoskeletal Exam: No pedal deformities noted.  Muscle strength is 5/5 all compartments.  There is no pinpoint tenderness with palpation   Assessment: 1.  Peroneal neuritis left foot and leg   Plan of Care:  1. Patient evaluated.  2.  Prescription for Medrol Dosepak.  After completion of the Dosepak resume meloxicam 15 mg daily 3.  Prescription for gabapentin 100 mg 3 times daily to see if this helps alleviate some of her nerve symptoms.  This will be a short course and not expected to be long-term 4.  Cam boot dispensed.  Weightbearing as tolerated  x3 weeks 5.  Return to clinic in 4 weeks      Felecia Shelling, DPM [facility]  Dr. Felecia Shelling, DPM    [address] [place], [place] [address]                Office [phone]  Fax [phone]

## 2021-07-06 ENCOUNTER — Encounter: Payer: Self-pay | Admitting: Obstetrics and Gynecology

## 2021-07-09 ENCOUNTER — Encounter: Payer: Self-pay | Admitting: Otolaryngology

## 2021-07-24 NOTE — Discharge Instructions (Signed)
[facility] ENDOSCOPIC SINUS SURGERY [facility]  What is Functional Endoscopic Sinus Surgery?  The Surgery involves making the natural openings of the sinuses larger by removing the bony partitions that separate the sinuses from the nasal cavity.  The natural sinus lining is preserved as much as possible to allow the sinuses to resume normal function after the surgery.  In some patients nasal polyps (excessively swollen lining of the sinuses) may be removed to relieve obstruction of the sinus openings.  The surgery is performed through the nose using lighted scopes, which eliminates the need for incisions on the face.  A septoplasty is a different procedure which is sometimes performed with sinus surgery.  It involves straightening the boy partition that separates the two sides of your nose.  A crooked or deviated septum may need repair if is obstructing the sinuses or nasal airflow.  Turbinate reduction is also often performed during sinus surgery.  The turbinates are bony proturberances from the side walls of the nose which swell and can obstruct the nose in patients with sinus and allergy problems.  Their size can be surgically reduced to help relieve nasal obstruction.  What Can Sinus Surgery Do For Me?  Sinus surgery can reduce the frequency of sinus infections requiring antibiotic treatment.  This can provide improvement in nasal congestion, post-nasal drainage, facial pressure and nasal obstruction.  Surgery will NOT prevent you from ever having an infection again, so it usually only for patients who get infections 4 or more times yearly requiring antibiotics, or for infections that do not clear with antibiotics.  It will not cure nasal allergies, so patients with allergies may still require medication to treat their allergies after surgery. Surgery may improve headaches related to sinusitis, however, some people will continue to  require medication to control sinus headaches related to allergies.  Surgery will do nothing for other forms of headache (migraine, tension or cluster).  What Are the Risks of Endoscopic Sinus Surgery?  Current techniques allow surgery to be performed safely with little risk, however, there are rare complications that patients should be aware of.  Because the sinuses are located around the eyes, there is risk of eye injury, including blindness, though again, this would be quite rare. This is usually a result of bleeding behind the eye during surgery, which can effect vision, though there are treatments to protect the vision and prevent permanent injury. More serious complications would include bleeding inside the brain cavity or damage to the brain.This happens when the fluid around the brain leaks out into the sinus cavity.  Again, all of these complications are uncommon, and spinal fluid leaks can be safely managed surgically if they occur.  The most common complication of sinus surgery is bleeding from the nose, which may require packing or cauterization of the nose.  Patients with polyps may experience recurrence of the polyps that would require revision surgery.  Alterations of sense of smell or injury to the tear ducts are also rare complications.   What is the Surgery Like, and what is the Recovery?  The Surgery usually takes a couple of hours to perform, and is usually performed under a general anesthetic (completely asleep).  Patients are usually discharged home after a couple of hours.  Sometimes during surgery it is necessary to pack the nose to control bleeding, and the packing is left in place for 24 - 48 hours, and removed by your surgeon.  If   a septoplasty was performed during the procedure, there is often a splint placed which must be removed after 5-7 days.   Discomfort: Pain is usually mild to moderate, and can be controlled by prescription pain medication or acetaminophen (Tylenol).   Aspirin, Ibuprofen (Advil, Motrin), or Naprosyn (Aleve) should be avoided, as they can cause increased bleeding.  Most patients feel sinus pressure like they have a bad head cold for several days.  Sleeping with your head elevated can help reduce swelling and facial pressure, as can ice packs over the face.  A humidifier may be helpful to keep the mucous and blood from drying in the nose.   Diet: There are no specific diet restrictions, however, you should generally start with clear liquids and a light diet of bland foods because the anesthetic can cause some nausea.  Advance your diet depending on how your stomach feels.  Taking your pain medication with food will often help reduce stomach upset which pain medications can cause.  Nasal Saline Irrigation: It is important to remove blood clots and dried mucous from the nose as it is healing.  This is done by having you irrigate the nose at least 3 - 4 times daily with a salt water solution.  We recommend using NeilMed Sinus Rinse (available at the drug store).  Fill the squeeze bottle with the solution, bend over a sink, and insert the tip of the squeeze bottle into the nose  of an inch.  Point the tip of the squeeze bottle towards the inside corner of the eye on the same side your irrigating.  Squeeze the bottle and gently irrigate the nose.  If you bend forward as you do this, most of the fluid will flow back out of the nose, instead of down your throat.   The solution should be warm, near body temperature, when you irrigate.   Each time you irrigate, you should use a full squeeze bottle.   Note that if you are instructed to use Nasal Steroid Sprays at any time after your surgery, irrigate with saline BEFORE using the steroid spray, so you do not wash it all out of the nose. Another product, Nasal Saline Gel (such as AYR Nasal Saline Gel) can be applied in each nostril 3 - 4 times daily to moisture the nose and reduce scabbing or crusting.  Bleeding:   Bloody drainage from the nose can be expected for several days, and patients are instructed to irrigate their nose frequently with salt water to help remove mucous and blood clots.  The drainage may be dark red or brown, though some fresh blood may be seen intermittently, especially after irrigation.  Do not blow you nose, as bleeding may occur. If you must sneeze, keep your mouth open to allow air to escape through your mouth.  If heavy bleeding occurs: Irrigate the nose with saline to rinse out clots, then spray the nose 3 - 4 times with Afrin Nasal Decongestant Spray.  The spray will constrict the blood vessels to slow bleeding.  Pinch the lower half of your nose shut to apply pressure, and lay down with your head elevated.  Ice packs over the nose may help as well. If bleeding persists despite these measures, you should notify your doctor.  Do not use the Afrin routinely to control nasal congestion after surgery, as it can result in worsening congestion and may affect healing.     Activity: Return to work varies among patients. Most patients will be out   of work at least 5 - 7 days to recover.  Patient may return to work after they are off of narcotic pain medication, and feeling well enough to perform the functions of their job.  Patients must avoid heavy lifting (over 10 pounds) or strenuous physical for 2 weeks after surgery, so your employer may need to assign you to light duty, or keep you out of work longer if light duty is not possible.  NOTE: you should not drive, operate dangerous machinery, do any mentally demanding tasks or make any important legal or financial decisions while on narcotic pain medication and recovering from the general anesthetic.    Call Your Doctor Immediately if You Have Any of the Following: Bleeding that you cannot control with the above measures Loss of vision, double vision, bulging of the eye or black eyes. Fever over 101 degrees Neck stiffness with severe headache,  fever, nausea and change in mental state. You are always encouraged to call anytime with concerns, however, please call with requests for pain medication refills during office hours.  Office Endoscopy: During follow-up visits your doctor will remove any packing or splints that may have been placed and evaluate and clean your sinuses endoscopically.  Topical anesthetic will be used to make this as comfortable as possible, though you may want to take your pain medication prior to the visit.  How often this will need to be done varies from patient to patient.  After complete recovery from the surgery, you may need follow-up endoscopy from time to time, particularly if there is concern of recurrent infection or nasal polyps.  

## 2021-07-28 ENCOUNTER — Ambulatory Visit
Admission: RE | Admit: 2021-07-28 | Discharge: 2021-07-28 | Disposition: A | Discharge status: Home / Self Care | Payer: Medicaid Other | Attending: Otolaryngology | Admitting: Otolaryngology

## 2021-07-28 ENCOUNTER — Ambulatory Visit: Payer: Medicaid Other | Admitting: Anesthesiology

## 2021-07-28 ENCOUNTER — Other Ambulatory Visit: Payer: Self-pay

## 2021-07-28 ENCOUNTER — Encounter: Payer: Self-pay | Admitting: Otolaryngology

## 2021-07-28 ENCOUNTER — Encounter
Admission: RE | Disposition: A | Discharge status: Home / Self Care | Payer: Self-pay | Source: Home / Self Care | Attending: Otolaryngology

## 2021-07-28 DIAGNOSIS — F419 Anxiety disorder, unspecified: Secondary | ICD-10-CM | POA: Diagnosis not present

## 2021-07-28 DIAGNOSIS — F32A Depression, unspecified: Secondary | ICD-10-CM | POA: Diagnosis not present

## 2021-07-28 DIAGNOSIS — K219 Gastro-esophageal reflux disease without esophagitis: Secondary | ICD-10-CM | POA: Diagnosis not present

## 2021-07-28 DIAGNOSIS — J45909 Unspecified asthma, uncomplicated: Secondary | ICD-10-CM | POA: Diagnosis not present

## 2021-07-28 DIAGNOSIS — F1290 Cannabis use, unspecified, uncomplicated: Secondary | ICD-10-CM | POA: Insufficient documentation

## 2021-07-28 DIAGNOSIS — Z793 Long term (current) use of hormonal contraceptives: Secondary | ICD-10-CM | POA: Diagnosis not present

## 2021-07-28 DIAGNOSIS — J329 Chronic sinusitis, unspecified: Secondary | ICD-10-CM | POA: Insufficient documentation

## 2021-07-28 DIAGNOSIS — Z791 Long term (current) use of non-steroidal anti-inflammatories (NSAID): Secondary | ICD-10-CM | POA: Insufficient documentation

## 2021-07-28 DIAGNOSIS — E669 Obesity, unspecified: Secondary | ICD-10-CM | POA: Insufficient documentation

## 2021-07-28 DIAGNOSIS — F17200 Nicotine dependence, unspecified, uncomplicated: Secondary | ICD-10-CM | POA: Diagnosis not present

## 2021-07-28 DIAGNOSIS — Z7951 Long term (current) use of inhaled steroids: Secondary | ICD-10-CM | POA: Insufficient documentation

## 2021-07-28 DIAGNOSIS — Z6834 Body mass index (BMI) 34.0-34.9, adult: Secondary | ICD-10-CM | POA: Insufficient documentation

## 2021-07-28 DIAGNOSIS — Z79899 Other long term (current) drug therapy: Secondary | ICD-10-CM | POA: Diagnosis not present

## 2021-07-28 HISTORY — PX: ENDOSCOPIC CONCHA BULLOSA RESECTION: SHX6395

## 2021-07-28 HISTORY — PX: ETHMOIDECTOMY: SHX5197

## 2021-07-28 HISTORY — PX: MAXILLARY ANTROSTOMY: SHX2003

## 2021-07-28 HISTORY — PX: IMAGE GUIDED SINUS SURGERY: SHX6570

## 2021-07-28 LAB — POCT PREGNANCY, URINE: Preg Test, Ur: NEGATIVE

## 2021-07-28 SURGERY — SINUS SURGERY, WITH IMAGING GUIDANCE
Anesthesia: General | Site: Nose

## 2021-07-28 MED ORDER — MIDAZOLAM HCL 5 MG/5ML IJ SOLN
INTRAMUSCULAR | Status: DC | PRN
  Administered 2021-07-28: 2 mg via INTRAVENOUS

## 2021-07-28 MED ORDER — PROPOFOL 10 MG/ML IV BOLUS
INTRAVENOUS | Status: DC | PRN
Start: 2021-07-28 — End: 2021-07-28
  Administered 2021-07-28: 200 mg via INTRAVENOUS

## 2021-07-28 MED ORDER — DEXMEDETOMIDINE (PRECEDEX) IN NS 20 MCG/5ML (4 MCG/ML) IV SYRINGE
PREFILLED_SYRINGE | INTRAVENOUS | Status: DC | PRN
  Administered 2021-07-28: 10 ug via INTRAVENOUS

## 2021-07-28 MED ORDER — SUCCINYLCHOLINE CHLORIDE 200 MG/10ML IV SOSY
PREFILLED_SYRINGE | INTRAVENOUS | Status: DC | PRN
  Administered 2021-07-28: 100 mg via INTRAVENOUS

## 2021-07-28 MED ORDER — GLYCOPYRROLATE 0.2 MG/ML IJ SOLN
INTRAMUSCULAR | Status: DC | PRN
  Administered 2021-07-28: .1 mg via INTRAVENOUS

## 2021-07-28 MED ORDER — LACTATED RINGERS IV SOLN: INTRAVENOUS | Status: DC

## 2021-07-28 MED ORDER — OXYMETAZOLINE HCL 0.05 % NA SOLN
NASAL | Status: DC | PRN
  Administered 2021-07-28: 1 via TOPICAL

## 2021-07-28 MED ORDER — OXYCODONE HCL 5 MG PO TABS: 5.0000 mg | ORAL_TABLET | Freq: Once | ORAL | Status: AC | PRN

## 2021-07-28 MED ORDER — FENTANYL CITRATE PF 50 MCG/ML IJ SOSY
25.0000 ug | PREFILLED_SYRINGE | INTRAMUSCULAR | Status: DC | PRN
  Administered 2021-07-28 (×2): 25 ug via INTRAVENOUS

## 2021-07-28 MED ORDER — SCOPOLAMINE 1 MG/3DAYS TD PT72
1.0000 | MEDICATED_PATCH | Freq: Once | TRANSDERMAL | Status: DC
  Administered 2021-07-28: 1.5 mg via TRANSDERMAL

## 2021-07-28 MED ORDER — HYDROCODONE-ACETAMINOPHEN 5-325 MG PO TABS
1.0000 | ORAL_TABLET | Freq: Four times a day (QID) | ORAL | 0 refills | Status: DC | PRN
Start: 2021-07-28 — End: 2021-10-14

## 2021-07-28 MED ORDER — DOXYCYCLINE HYCLATE 100 MG PO CAPS: ORAL_CAPSULE | ORAL | 0 refills | Status: DC

## 2021-07-28 MED ORDER — DEXAMETHASONE SODIUM PHOSPHATE 4 MG/ML IJ SOLN
INTRAMUSCULAR | Status: DC | PRN
  Administered 2021-07-28: 8 mg via INTRAVENOUS

## 2021-07-28 MED ORDER — ACETAMINOPHEN 325 MG PO TABS: 325.0000 mg | ORAL_TABLET | ORAL | Status: DC | PRN

## 2021-07-28 MED ORDER — FENTANYL CITRATE (PF) 100 MCG/2ML IJ SOLN
INTRAMUSCULAR | Status: DC | PRN
  Administered 2021-07-28: 100 ug via INTRAVENOUS

## 2021-07-28 MED ORDER — LIDOCAINE HCL (CARDIAC) PF 100 MG/5ML IV SOSY
PREFILLED_SYRINGE | INTRAVENOUS | Status: DC | PRN
  Administered 2021-07-28: 50 mg via INTRAVENOUS

## 2021-07-28 MED ORDER — ACETAMINOPHEN 10 MG/ML IV SOLN
1000.0000 mg | Freq: Once | INTRAVENOUS | Status: AC
  Administered 2021-07-28: 1000 mg via INTRAVENOUS

## 2021-07-28 MED ORDER — OXYCODONE HCL 5 MG/5ML PO SOLN
5.0000 mg | Freq: Once | ORAL | Status: AC | PRN
  Administered 2021-07-28: 5 mg via ORAL

## 2021-07-28 MED ORDER — LIDOCAINE-EPINEPHRINE 1 %-1:100000 IJ SOLN
INTRAMUSCULAR | Status: DC | PRN
  Administered 2021-07-28: 10 mL

## 2021-07-28 MED ORDER — ACETAMINOPHEN 160 MG/5ML PO SOLN: 325.0000 mg | ORAL | Status: DC | PRN

## 2021-07-28 SURGICAL SUPPLY — 23 items
BATTERY INSTRU NAVIGATION (MISCELLANEOUS) ×9 IMPLANT
BTRY SRG DRVR LF (MISCELLANEOUS) ×6
CANISTER SUCT 1200ML W/VALVE (MISCELLANEOUS) ×3 IMPLANT
COAG SUCT 10F 3.5MM HAND CTRL (MISCELLANEOUS) ×3 IMPLANT
ELECT REM PT RETURN 9FT ADLT (ELECTROSURGICAL) ×3
ELECTRODE REM PT RTRN 9FT ADLT (ELECTROSURGICAL) ×2 IMPLANT
GLOVE SURG ENC MOIS LTX SZ7.5 (GLOVE) ×7 IMPLANT
GOWN STRL REUS W/ TWL LRG LVL3 (GOWN DISPOSABLE) ×2 IMPLANT
GOWN STRL REUS W/TWL LRG LVL3 (GOWN DISPOSABLE) ×3
IV NS 500ML (IV SOLUTION) ×3
IV NS 500ML BAXH (IV SOLUTION) ×2 IMPLANT
KIT TURNOVER KIT A (KITS) ×3 IMPLANT
NS IRRIG 500ML POUR BTL (IV SOLUTION) ×3 IMPLANT
PACK ENT CUSTOM (PACKS) ×3 IMPLANT
PACKING NASAL EPIS 4X2.4 XEROG (MISCELLANEOUS) ×1 IMPLANT
PATTIES SURGICAL .5 X3 (DISPOSABLE) ×3 IMPLANT
SHAVER DIEGO BLD STD TYPE A (BLADE) ×3 IMPLANT
SOL ANTI-FOG 6CC FOG-OUT (MISCELLANEOUS) ×2 IMPLANT
SOL FOG-OUT ANTI-FOG 6CC (MISCELLANEOUS) ×1
SYR 10ML LL (SYRINGE) ×3 IMPLANT
TRACKER CRANIALMASK (MASK) ×3 IMPLANT
TUBING DECLOG MULTIDEBRIDER (TUBING) ×3 IMPLANT
WATER STERILE IRR 250ML POUR (IV SOLUTION) IMPLANT

## 2021-07-28 NOTE — Anesthesia Procedure Notes (Signed)
Procedure Name: Intubation Date/Time: 07/28/2021 10:16 AM Performed by: Mayme Genta, CRNA Pre-anesthesia Checklist: Patient identified, Emergency Drugs available, Suction available, Patient being monitored and Timeout performed Patient Re-evaluated:Patient Re-evaluated prior to induction Oxygen Delivery Method: Circle system utilized Preoxygenation: Pre-oxygenation with 100% oxygen Induction Type: IV induction Ventilation: Mask ventilation without difficulty Laryngoscope Size: Miller and 2 Grade View: Grade I Tube type: Oral Rae Tube size: 7.0 mm Number of attempts: 1 Placement Confirmation: ETT inserted through vocal cords under direct vision, positive ETCO2 and breath sounds checked- equal and bilateral Tube secured with: Tape Dental Injury: Teeth and Oropharynx as per pre-operative assessment

## 2021-07-28 NOTE — H&P (Signed)
Beth Willis, Beth Willis [id number] 01/01/1996  Date of Admission: @TODAY @ Admitting Physician: Sandi MealyPaul S Emmaly Leech  Chief Complaint: Chronic sinusitis  HPI: This 26 y.o. year old female with chronic sinusitis despite antibiotic management, nasal steroids, prednisone. See prior H&P for full details.  Medications:  Medications Prior to Admission  Medication Sig Dispense Refill   Biotin 10 MG TABS Take by mouth daily.     busPIRone (BUSPAR) 10 MG tablet Take 1 tablet (10 mg total) by mouth 2 (two) times daily. 180 tablet 2   Cholecalciferol (D3-1000) 25 MCG (1000 UT) tablet Take 2,000 Units by mouth daily.     cyclobenzaprine (FLEXERIL) 10 MG tablet Take 1 tablet (10 mg total) by mouth 3 (three) times daily as needed for muscle spasms. 90 tablet 1   fluticasone (FLONASE) 50 MCG/ACT nasal spray Place 2 sprays into both nostrils daily.     montelukast (SINGULAIR) 10 MG tablet Take 10 mg by mouth daily.     clindamycin (CLEOCIN) 300 MG capsule Take 300 mg by mouth 3 (three) times daily. (Patient not taking: Reported on 27/05/2022)     gabapentin (NEURONTIN) 100 MG capsule Take 1 capsule (100 mg total) by mouth 3 (three) times daily. (Patient not taking: Reported on 07/28/2021) 90 capsule 1   medroxyPROGESTERone (PROVERA) 10 MG tablet Take 1 tablet (10 mg total) by mouth daily. Use for ten days (Patient not taking: Reported on 27/05/2022) 10 tablet 0   meloxicam (MOBIC) 15 MG tablet TAKE 1 TABLET BY MOUTH EVERY DAY 30 tablet 3   methylPREDNISolone (MEDROL DOSEPAK) 4 MG TBPK tablet 6 day dose pack - take as directed (Patient not taking: Reported on 27/05/2022) 21 tablet 0    Allergies:  Allergies  Allergen Reactions   Dust Mite Extract Shortness Of Breath and Swelling   Bee Pollen Other (See Comments)   Penicillins Hives    Did it involve swelling of the face/tongue/throat, SOB, or low BP? Unknown Did it involve sudden or severe rash/hives, skin peeling, or any reaction on the inside of your mouth or nose?  No Did you need to seek medical attention at a hospital or doctor's office? No When did it last happen?      Childhood allergy   Pollen Extract    Amoxicillin Hives    Did it involve swelling of the face/tongue/throat, SOB, or low BP? Unknown Did it involve sudden or severe rash/hives, skin peeling, or any reaction on the inside of your mouth or nose? No Did you need to seek medical attention at a hospital or doctor's office? No When did it last happen?      Childhood allergy If all above answers are NO, may proceed with cephalosporin use.     PMH:  Past Medical History:  Diagnosis Date   ADD (attention deficit disorder)    Anemia    Anxiety and depression 05/10/2019   Asthma    WELL CONTROLLED   GERD (gastroesophageal reflux disease)    OCC TUMS PRN   Headache    MIGRAINES   History of substance abuse (HCC)     Fam Hx:  Family History  Problem Relation Age of Onset   Hypertension Mother    Hypertension Father    Hypertension Maternal Aunt    Hypertension Maternal Uncle    Hypertension Paternal Aunt    Diabetes Paternal Aunt    Hypertension Paternal Uncle    Hypertension Maternal Grandmother    Diabetes Maternal Grandmother    Thyroid disease Maternal Grandmother  Hypertension Maternal Grandfather    Thyroid disease Maternal Grandfather    Hypertension Paternal Grandmother    Thyroid disease Paternal Grandmother    Hypertension Paternal Grandfather    Thyroid disease Paternal Grandfather     Soc Hx:  Social History   Socioeconomic History   Marital status: Married    Spouse name: Not on file   Number of children: 0   Years of education: 12   Highest education level: Not on file  Occupational History   Occupation: Conservation officer, nature    Comment: Actor   Occupation: call center  Tobacco Use   Smoking status: Some Days    Types: E-cigarettes   Smokeless tobacco: Never  Vaping Use   Vaping Use: Former  Substance and Sexual Activity   Alcohol use: No    Drug use: Yes    Types: Marijuana    Comment: hx of cocaine, meth, and acid use-Last used 2017   Sexual activity: Yes    Birth control/protection: None  Other Topics Concern   Not on file  Social History Narrative   Not on file   Social Determinants of Health   Financial Resource Strain: Not on file  Food Insecurity: Not on file  Transportation Needs: Not on file  Physical Activity: Not on file  Stress: Not on file  Social Connections: Not on file  Intimate Partner Violence: Not on file    PSH:  Past Surgical History:  Procedure Laterality Date   FOOT SURGERY  10/2019   FOOT SURGERY  11/28/2020   MYRINGOTOMY WITH TUBE PLACEMENT Left 08/16/2018   Procedure: MYRINGOTOMY WITH TUBE PLACEMENT;  Surgeon: Geanie Logan, MD;  Location: [facility] ORS;  Service: ENT;  Laterality: Left;   TONSILLECTOMY  2014   Pt not sure if addenoids taken   TYMPANOPLASTY WITH GRAFT Right 08/16/2018   Procedure: TYMPANOMASTOIDECTOMY WITH POSSIBLE OSSICULAR GRAFT;  Surgeon: Geanie Logan, MD;  Location: [facility] OR[facility];  Service: ENT;  Laterality: Right;   WISDOM TOOTH EXTRACTION  2014  .   ROS: Negative for fever. No change in health status  PHYSICAL EXAM  Vitals: Blood pressure 109/70, pulse 74, temperature 97.8 F (36.6 C), temperature source Temporal, resp. rate 18, height 5\' 7"  (1.702 m), weight 99.8 kg, last menstrual period 06/30/2021, SpO2 97 %.. General: Well-developed, Well-nourished in no acute distress Mood: Mood and affect well adjusted, pleasant and cooperative. Orientation: Grossly alert and oriented. Vocal Quality: No hoarseness. Communicates verbally. head and Face: NCAT. No facial asymmetry. No visible skin lesions. No significant facial scars. No tenderness with sinus percussion. Facial strength normal and symmetric. Ears: External ears with normal landmarks, no lesions.  Respiratory: Normal respiratory effort without labored breathing. Lungs CTA bilaterally Cardiovascular: Heart has  regular rate and rhythm Neurologic: Cranial Nerves II through XII are grossly intact. Eyes: Gaze and Ocular Motility are grossly normal. PERRLA. No visible nystagmus.  MEDICAL DECISION MAKING: Data Review:  Results for orders placed or performed during the hospital encounter of 07/28/21 (from the past 48 hour(s))  Pregnancy, urine POC     Status: None   Collection Time: 07/28/21  9:34 AM  Result Value Ref Range   Preg Test, Ur NEGATIVE NEGATIVE    Comment:        THE SENSITIVITY OF THIS METHODOLOGY IS >24 mIU/mL   . No results found..   ASSESSMENT: Chronic sinusitis  PLAN: Bilateral endoscopic maxillary antrostomies with tissue removal, anterior ethmoidectomies, concha bullosa resection. Image guided surgery   07/30/21 07/28/2021  9:54 AM

## 2021-07-28 NOTE — Op Note (Signed)
07/28/2021  11:13 AM    [facility] Ex[facility]  [id number]   Pre-Op Diagnosis:  Chronic sinusitis  Post-op Diagnosis: Chronic sinusitis  Procedure:  1)  Image Guided Sinus Surgery,   2)  Bilateral Endoscopic Maxillary Antrostomy with Tissue Removal   3)  Bilateral concha bullosa resection   4)  Bilateral anterior Ethmoidectomy       Surgeon:  Sandi Mealy  Anesthesia:  General endotracheal  EBL:  less than 25 cc   Complications:  None  Findings: Mucosal thickening in the ethmoid and maxillary sinuses with mucous filling the right maxillary sinus along with a small sinus polyp  Procedure: After the patient was identified in holding and the benefits of the procedure were reviewed as well as the consent and risks, the patient was taken to the operating room and with the patient in a comfortable supine position,  general orotracheal anesthesia was induced without difficulty.  A proper time-out was performed.  The [employer] image guidance system was set up and calibrated in the normal fashion and felt to be acceptable.  Next 1% Xylocaine with 1:100,000 epinephrine was infiltrated into the inferior turbinates, septum, and anterior middle turbinates bilaterally.  Several minutes were allowed for this to take effect.  Cottoniod pledgets soaked in Afrin were placed into both nasal cavities and left while the patient was prepped and draped in the standard fashion. The image guided suction was calibrated and used to inspect known points in the nasal cavity to assess accuracy of the image guided system. Accuracy was felt to be excellent.   The left middle turbinate was incised with a sickle knife, entering the concha bullosa.  The lateral wall of the concha bullosa was then resected with endoscopic scissors.   the uncinate process then resected with through-cutting forceps as well as the microdebrider. In this fashion the uncinate was completely removed along with soft tissue and bone of the medial  wall of the maxillary sinus to create a large patent maxillary antrostomy. The left maxillary sinus was suctioned to clear secretions.   Next the left anterior ethmoid sinuses were dissected beginning inferomedially, entering the ethmoid bulla. Thru cut forceps were used to open the anterior ethmoids. The microdebrider was used as needed to trim loose mucosal edges in the anterior ethmoids and the edges of the concha bullosa along the middle turbinate.   Attention was then turned to the right side where the same procedure was performed, opening the concha bullosa, maxillary sinus, anterior ethmoid cavities.  Mucus was suctioned from the maxillary sinus on the right side.  A polyp was identified within the sinus and was removed with an angled maxillary sinus forcep.  The nose was suctioned and inspected. The maxillary sinuses were irrigated with saline.  Xerogel absorbable sinus packing was then placed in the ethmoid cavities bilaterally.   The patient was then returned to the anesthesiologist for awakening and taken to recovery room in good condition postoperatively.  Disposition:   PACU and d/c home  Plan: Ice, elevation, narcotic analgesia and prophylactic antibiotics. Begin sinus irrigations with saline tomrrow, irrigating 3-4 times daily. Return to the office in 7 days.  Return to work in 7-10 days, no strenuous activities for two weeks.   Sandi Mealy 07/28/2021 11:13 AM

## 2021-07-28 NOTE — Anesthesia Preprocedure Evaluation (Signed)
Anesthesia Evaluation  Patient identified by MRN, date of birth, ID band Patient awake    Reviewed: Allergy & Precautions, NPO status   Airway Mallampati: II  TM Distance: >3 FB     Dental   Pulmonary asthma , Current Smoker and Patient abstained from smoking.,    Pulmonary exam normal        Cardiovascular negative cardio ROS   Rhythm:Regular Rate:Normal     Neuro/Psych  Headaches, PSYCHIATRIC DISORDERS Anxiety Depression ADD    GI/Hepatic GERD  ,  Endo/Other  Obesity - BMI 34  Renal/GU      Musculoskeletal   Abdominal   Peds  Hematology   Anesthesia Other Findings   Reproductive/Obstetrics                             Anesthesia Physical Anesthesia Plan  ASA: 2  Anesthesia Plan: General   Post-op Pain Management:    Induction: Intravenous  PONV Risk Score and Plan: Ondansetron, Dexamethasone, Midazolam and Treatment may vary due to age or medical condition  Airway Management Planned: Oral ETT  Additional Equipment:   Intra-op Plan:   Post-operative Plan:   Informed Consent: I have reviewed the patients History and Physical, chart, labs and discussed the procedure including the risks, benefits and alternatives for the proposed anesthesia with the patient or authorized representative who has indicated his/her understanding and acceptance.     Dental advisory given  Plan Discussed with: CRNA  Anesthesia Plan Comments:         Anesthesia Quick Evaluation

## 2021-07-28 NOTE — Transfer of Care (Signed)
Immediate Anesthesia Transfer of Care Note  Patient: Beth Willis  Procedure(s) Performed: IMAGE GUIDED SINUS SURGERY (Nose) ENDOSCOPIC MAXILLARY ANTROSTOMY WITH TISSUE REMOVAL (Bilateral: Nose) ENDOSCOPIC CONCHA BULLOSA RESECTION (Bilateral: Nose) ANTERIOR ETHMOIDECTOMY (Bilateral: Nose)  Patient Location: PACU  Anesthesia Type: General  Level of Consciousness: awake, alert  and patient cooperative  Airway and Oxygen Therapy: Patient Spontanous Breathing and Patient connected to supplemental oxygen  Post-op Assessment: Post-op Vital signs reviewed, Patient's Cardiovascular Status Stable, Respiratory Function Stable, Patent Airway and No signs of Nausea or vomiting  Post-op Vital Signs: Reviewed and stable  Complications: No notable events documented.

## 2021-07-28 NOTE — Anesthesia Postprocedure Evaluation (Signed)
Anesthesia Post Note  Patient: Beth Willis  Procedure(s) Performed: IMAGE GUIDED SINUS SURGERY (Nose) ENDOSCOPIC MAXILLARY ANTROSTOMY WITH TISSUE REMOVAL (Bilateral: Nose) ENDOSCOPIC CONCHA BULLOSA RESECTION (Bilateral: Nose) ANTERIOR ETHMOIDECTOMY (Bilateral: Nose)     Patient location during evaluation: PACU Anesthesia Type: General Level of consciousness: awake Pain management: pain level controlled Vital Signs Assessment: post-procedure vital signs reviewed and stable Respiratory status: respiratory function stable Cardiovascular status: stable Postop Assessment: no signs of nausea or vomiting Anesthetic complications: no   No notable events documented.  Veda Canning

## 2021-07-29 LAB — SURGICAL PATHOLOGY

## 2021-07-31 ENCOUNTER — Encounter: Payer: Self-pay | Admitting: Podiatry

## 2021-07-31 ENCOUNTER — Other Ambulatory Visit: Payer: Self-pay

## 2021-07-31 ENCOUNTER — Ambulatory Visit: Payer: Medicaid Other | Admitting: Podiatry

## 2021-07-31 DIAGNOSIS — G5761 Lesion of plantar nerve, right lower limb: Secondary | ICD-10-CM

## 2021-07-31 DIAGNOSIS — G5792 Unspecified mononeuropathy of left lower limb: Secondary | ICD-10-CM

## 2021-07-31 NOTE — Progress Notes (Signed)
HPI: 26 y.o. female presenting today for follow-up evaluation of neuritis to the left leg.  Patient states that she is doing very well.  She says the Medrol Dosepak helped significantly.  She no longer has any of the numbness associated to her leg that she described last visit.  She did not take the gabapentin.  Past Medical History:  Diagnosis Date   ADD (attention deficit disorder)    Anemia    Anxiety and depression 05/10/2019   Asthma    WELL CONTROLLED   GERD (gastroesophageal reflux disease)    OCC TUMS PRN   Headache    MIGRAINES   History of substance abuse [facility])     Past Surgical History:  Procedure Laterality Date   ENDOSCOPIC CONCHA BULLOSA RESECTION Bilateral 07/28/2021   Procedure: ENDOSCOPIC CONCHA BULLOSA RESECTION;  Surgeon: Geanie Logan, MD;  Location: [facility];  Service: ENT;  Laterality: Bilateral;   ETHMOIDECTOMY Bilateral 07/28/2021   Procedure: ANTERIOR ETHMOIDECTOMY;  Surgeon: Geanie Logan, MD;  Location: [facility];  Service: ENT;  Laterality: Bilateral;   FOOT SURGERY  10/2019   FOOT SURGERY  11/28/2020   IMAGE GUIDED SINUS SURGERY N/A 07/28/2021   Procedure: IMAGE GUIDED SINUS SURGERY;  Surgeon: Geanie Logan, MD;  Location: [facility];  Service: ENT;  Laterality: N/A;  need stryker disk placed disk on OR charge nurse desk 1-16 kp   MAXILLARY ANTROSTOMY Bilateral 07/28/2021   Procedure: ENDOSCOPIC MAXILLARY ANTROSTOMY WITH TISSUE REMOVAL;  Surgeon: Geanie Logan, MD;  Location: [facility];  Service: ENT;  Laterality: Bilateral;   MYRINGOTOMY WITH TUBE PLACEMENT Left 08/16/2018   Procedure: MYRINGOTOMY WITH TUBE PLACEMENT;  Surgeon: Geanie Logan, MD;  Location: [facility] ORS;  Service: ENT;  Laterality: Left;   TONSILLECTOMY  2014   Pt not sure if addenoids taken   TYMPANOPLASTY WITH GRAFT Right 08/16/2018   Procedure: TYMPANOMASTOIDECTOMY WITH POSSIBLE OSSICULAR GRAFT;  Surgeon: Geanie Logan, MD;  Location: [facility] OR[facility];   Service: ENT;  Laterality: Right;   WISDOM TOOTH EXTRACTION  2014    Allergies  Allergen Reactions   Dust Mite Extract Shortness Of Breath and Swelling   Bee Pollen Other (See Comments)   Penicillins Hives    Did it involve swelling of the face/tongue/throat, SOB, or low BP? Unknown Did it involve sudden or severe rash/hives, skin peeling, or any reaction on the inside of your mouth or nose? No Did you need to seek medical attention at a hospital or doctor's office? No When did it last happen?      Childhood allergy   Pollen Extract    Amoxicillin Hives    Did it involve swelling of the face/tongue/throat, SOB, or low BP? Unknown Did it involve sudden or severe rash/hives, skin peeling, or any reaction on the inside of your mouth or nose? No Did you need to seek medical attention at a hospital or doctor's office? No When did it last happen?      Childhood allergy If all above answers are NO, may proceed with cephalosporin use.      Physical Exam: General: The patient is alert and oriented x3 in no acute distress.  Dermatology: Skin is warm, dry and supple bilateral lower extremities. Negative for open lesions or macerations.  Vascular: Palpable pedal pulses bilaterally. Capillary refill within normal limits.  Negative for any significant edema or erythema  Neurological: Light touch and protective threshold grossly intact  Musculoskeletal Exam: No pedal deformities noted   Assessment: 1.  Peroneal neuritis left foot and leg; resolved   Plan of Care:  1. Patient evaluated. 2.  Patient may now resume full activity no restrictions 3.  Continue meloxicam 15 mg daily as needed  4.  Patient may discontinue the cam boot.  Patient states that she does wear the cam boot on occasion when she is on her feet a significant amount of time.   5.  Return to clinic as needed      Felecia Shelling, DPM [facility]  Dr. Felecia Shelling, DPM    [address] [place], [place] [address]                Office [phone]  Fax [phone]

## 2021-08-12 ENCOUNTER — Ambulatory Visit: Payer: Medicaid Other | Admitting: Obstetrics and Gynecology

## 2021-08-17 ENCOUNTER — Encounter: Payer: Self-pay | Admitting: Podiatry

## 2021-08-31 ENCOUNTER — Other Ambulatory Visit: Payer: Self-pay | Admitting: Podiatry

## 2021-09-01 ENCOUNTER — Telehealth: Payer: Self-pay | Admitting: Podiatry

## 2021-09-01 DIAGNOSIS — G5792 Unspecified mononeuropathy of left lower limb: Secondary | ICD-10-CM

## 2021-09-01 DIAGNOSIS — M722 Plantar fascial fibromatosis: Secondary | ICD-10-CM

## 2021-09-01 NOTE — Telephone Encounter (Signed)
Patient just came into the office and picked up.

## 2021-09-01 NOTE — Telephone Encounter (Signed)
Pt called upset that she picked up the referral today for the physical therapy and the place does not accept her insurance. Pt stated she was told she would need to go to [place] for physical therapy. She was also upset about the handicap placard only being for 3 months could you extend it?  ?

## 2021-09-01 NOTE — Telephone Encounter (Signed)
Patient called she is waiting on a referral for PT , please advise.

## 2021-09-01 NOTE — Telephone Encounter (Signed)
Spoke with patient this evening on the phone addressing concerns and complaints regarding patient waitt time and office staff interactions.  ? ?Regarding wait times at the office visit explained to the patient that unfortunately we have a busy practice and sometimes there is a wait to be seen. Her last 2 appts of this year she has checked in and out almost exactly 1 hour for total visit which is standard. Patient understands. She mentioned a wait time of 1 1/2 hours during a visit last year that she was not happy with. I apologized and explained that we do our best to stay on time throughout the day. ? ?Regarding office staff interactions, patient had some complaints regarding front office staff when she came in today, no appt, to pick up a prescription for physical therapy and a handicap parking placard form.  Patient states that she had to wait 15 minutes while the front receptionist was checking in and checking out patients.  She did not understand why she could not be taken care of first before patient is checked in or checked out.  Patient states that she felt discriminated against.  I apologized to the patient over the phone and explained that we only had 1 office staff at that time and she was doing her best to manage patient workflow and ensured her that there is no discrimination with our front office staff and we treat all patients equally and fairly as possible, wanting all patients to have a positive patient care experience.  ? ?There was some miscommunication on the physicians part today.  The handicap parking placard was completed for 3 months, standard, however the patient requested 6 months.  I am more than happy to have the patient come in for a new temporary handicap parking placard for 6 months. ? ?Also the patient was given a prescription for physical therapy at [facility].  Patient has Medicaid and [facility] does not accept Medicaid.  We will cancel that order and place a new order for PT at  [facility].  ? ?Patient appreciative for the call. Will f/u in office PRN.  ? ?Felecia Shelling, DPM ?[facility] ? ?Dr. Felecia Shelling, DPM  ?  ?[address]                                    ?[place], [place] [address]                ?Office [phone]  ?Fax [phone] ? ? ? ?

## 2021-09-09 ENCOUNTER — Ambulatory Visit: Payer: Medicaid Other | Admitting: Physical Therapy

## 2021-09-16 ENCOUNTER — Ambulatory Visit: Payer: Medicaid Other | Admitting: Obstetrics & Gynecology

## 2021-09-17 ENCOUNTER — Ambulatory Visit: Payer: Medicaid Other | Attending: Podiatry | Admitting: Physical Therapy

## 2021-09-17 ENCOUNTER — Other Ambulatory Visit: Payer: Self-pay

## 2021-09-17 ENCOUNTER — Encounter: Payer: Self-pay | Admitting: Physical Therapy

## 2021-09-17 DIAGNOSIS — M722 Plantar fascial fibromatosis: Secondary | ICD-10-CM | POA: Diagnosis not present

## 2021-09-17 DIAGNOSIS — R202 Paresthesia of skin: Secondary | ICD-10-CM | POA: Diagnosis present

## 2021-09-17 DIAGNOSIS — G8929 Other chronic pain: Secondary | ICD-10-CM | POA: Insufficient documentation

## 2021-09-17 DIAGNOSIS — G5792 Unspecified mononeuropathy of left lower limb: Secondary | ICD-10-CM | POA: Diagnosis not present

## 2021-09-17 DIAGNOSIS — M6281 Muscle weakness (generalized): Secondary | ICD-10-CM | POA: Insufficient documentation

## 2021-09-17 DIAGNOSIS — M545 Low back pain, unspecified: Secondary | ICD-10-CM | POA: Diagnosis present

## 2021-09-17 DIAGNOSIS — M79672 Pain in left foot: Secondary | ICD-10-CM | POA: Insufficient documentation
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
## 2021-09-21 ENCOUNTER — Ambulatory Visit: Payer: Medicaid Other | Admitting: Obstetrics and Gynecology

## 2021-09-21 NOTE — Progress Notes (Deleted)
? ?PCP:  Patient, No Pcp Per (Inactive) ? ? ?No chief complaint on file. ? ? ? ?HPI: ?     [name] is a 26 y.o. G1P1001 who LMP was No LMP recorded., presents today for her annual examination.  Her menses are regular every 28-30 days, lasting 3 days.  Dysmenorrhea mild, improved with NSAIDs. She does not have intermenstrual bleeding. ? ?Sex activity: single partner, contraception - none, trying to conceive for a year. Not taking PNVs. Partner is FOB of their 3 yr son. Pt has had positive urine ovulation pred kits, s/p SVD. Not taking PNVs. Is getting married 9/22 so not sure if she wants to pursue fertility eval before hand.  Was seeing DR. Staebler for infertility; had neg labs for PCOS. Missed period 11/22 with pos withdrawal bleed with provera. Next step was HSG ? ?Last Pap: 09/17/20 Results were: no abnormalities   ? ?There is no FH of breast cancer. There is no FH of ovarian cancer. The patient self-breast exams. ? ?Tobacco use: quit cigs ?Alcohol use: none ?No drug use.  ?Exercise: min active ? ?She does get adequate calcium but not  Vitamin D in her diet. ? ?Hx of anxiety/ depression sx; initially seen 11/20. Was on zoloft 150 mg but didn't help with sx and pt didn't feel well. Changed to buspar 7.5 mg BID 7/21 and was doing better at first, now doesn't feel it works as well. Feels better when she doesn't take it; has little emotion after taking it; but needs something for worry/irritability. Anxiety is biggest sx for pt. Was taking trazodone for sleep. Wants to conceive so limited in tx options. Hx of ADHD in past as well, didn't do well with adderall.  ? ?Hx of neck and back pain, due to pinched nerves. Seeing chiro with sx improvement. Needs note for insurance to cont therapy. ? ? ?Past Medical History:  ?Diagnosis Date  ? ADD (attention deficit disorder)   ? Anemia   ? Anxiety and depression 05/10/2019  ? Asthma   ? WELL CONTROLLED  ? GERD (gastroesophageal reflux disease)   ? OCC TUMS PRN   ? Headache   ? MIGRAINES  ? History of substance abuse ([facility])   ? ? ?Past Surgical History:  ?Procedure Laterality Date  ? ENDOSCOPIC CONCHA BULLOSA RESECTION Bilateral 07/28/2021  ? Procedure: ENDOSCOPIC CONCHA BULLOSA RESECTION;  Surgeon: Geanie Logan, MD;  Location: [facility];  Service: ENT;  Laterality: Bilateral;  ? ETHMOIDECTOMY Bilateral 07/28/2021  ? Procedure: ANTERIOR ETHMOIDECTOMY;  Surgeon: Geanie Logan, MD;  Location: [facility];  Service: ENT;  Laterality: Bilateral;  ? FOOT SURGERY  10/2019  ? FOOT SURGERY  11/28/2020  ? IMAGE GUIDED SINUS SURGERY N/A 07/28/2021  ? Procedure: IMAGE GUIDED SINUS SURGERY;  Surgeon: Geanie Logan, MD;  Location: [facility];  Service: ENT;  Laterality: N/A;  need stryker disk ?placed disk on OR charge nurse desk 1-16 kp  ? MAXILLARY ANTROSTOMY Bilateral 07/28/2021  ? Procedure: ENDOSCOPIC MAXILLARY ANTROSTOMY WITH TISSUE REMOVAL;  Surgeon: Geanie Logan, MD;  Location: [facility];  Service: ENT;  Laterality: Bilateral;  ? MYRINGOTOMY WITH TUBE PLACEMENT Left 08/16/2018  ? Procedure: MYRINGOTOMY WITH TUBE PLACEMENT;  Surgeon: Geanie Logan, MD;  Location: [facility];  Service: ENT;  Laterality: Left;  ? TONSILLECTOMY  2014  ? Pt not sure if addenoids taken  ? TYMPANOPLASTY WITH GRAFT Right 08/16/2018  ? Procedure: TYMPANOMASTOIDECTOMY WITH POSSIBLE OSSICULAR GRAFT;  Surgeon: Geanie Logan, MD;  Location: [facility];  Service: ENT;  Laterality: Right;  ? WISDOM TOOTH EXTRACTION  2014  ? ? ?Family History  ?Problem Relation Age of Onset  ? Hypertension Mother   ? Hypertension Father   ? Hypertension Maternal Aunt   ? Hypertension Maternal Uncle   ? Hypertension Paternal Aunt   ? Diabetes Paternal Aunt   ? Hypertension Paternal Uncle   ? Hypertension Maternal Grandmother   ? Diabetes Maternal Grandmother   ? Thyroid disease Maternal Grandmother   ? Hypertension Maternal Grandfather   ? Thyroid disease Maternal Grandfather   ? Hypertension  Paternal Grandmother   ? Thyroid disease Paternal Grandmother   ? Hypertension Paternal Grandfather   ? Thyroid disease Paternal Grandfather   ? ? ?Social History  ? ?Socioeconomic History  ? Marital status: Married  ?  Spouse name: Not on file  ? Number of children: 0  ? Years of education: 41  ? Highest education level: Not on file  ?Occupational History  ? Occupation: Conservation officer, nature  ?  Comment: [employer]  ? Occupation: call center  ?Tobacco Use  ? Smoking status: Some Days  ?  Types: E-cigarettes  ? Smokeless tobacco: Never  ?Vaping Use  ? Vaping Use: Former  ?Substance and Sexual Activity  ? Alcohol use: No  ? Drug use: Yes  ?  Types: Marijuana  ?  Comment: hx of cocaine, meth, and acid use-Last used 2017  ? Sexual activity: Yes  ?  Birth control/protection: None  ?Other Topics Concern  ? Not on file  ?Social History Narrative  ? Not on file  ? ?Social Determinants of Health  ? ?Financial Resource Strain: Not on file  ?Food Insecurity: Not on file  ?Transportation Needs: Not on file  ?Physical Activity: Not on file  ?Stress: Not on file  ?Social Connections: Not on file  ?Intimate Partner Violence: Not on file  ? ? ? ?Current Outpatient Medications:  ?  Biotin 10 MG TABS, Take by mouth daily., Disp: , Rfl:  ?  busPIRone (BUSPAR) 10 MG tablet, Take 1 tablet (10 mg total) by mouth 2 (two) times daily., Disp: 180 tablet, Rfl: 2 ?  Cholecalciferol (D3-1000) 25 MCG (1000 UT) tablet, Take 2,000 Units by mouth daily., Disp: , Rfl:  ?  cyclobenzaprine (FLEXERIL) 10 MG tablet, Take 1 tablet (10 mg total) by mouth 3 (three) times daily as needed for muscle spasms., Disp: 90 tablet, Rfl: 1 ?  doxycycline (VIBRAMYCIN) 100 MG capsule, 100 mg PO BID x 10 days, Disp: 20 capsule, Rfl: 0 ?  fluticasone (FLONASE) 50 MCG/ACT nasal spray, Place 2 sprays into both nostrils daily., Disp: , Rfl:  ?  HYDROcodone-acetaminophen (NORCO/VICODIN) 5-325 MG tablet, Take 1-2 tablets by mouth every 6 (six) hours as needed for moderate pain., Disp: 40  tablet, Rfl: 0 ?  meloxicam (MOBIC) 15 MG tablet, TAKE 1 TABLET BY MOUTH EVERY DAY, Disp: 30 tablet, Rfl: 3 ?  montelukast (SINGULAIR) 10 MG tablet, Take 10 mg by mouth daily., Disp: , Rfl:  ? ? ? ? ?ROS: ? ?Review of Systems  ?Constitutional:  Negative for fatigue, fever and unexpected weight change.  ?Respiratory:  Negative for cough, shortness of breath and wheezing.   ?Cardiovascular:  Negative for chest pain, palpitations and leg swelling.  ?Gastrointestinal:  Negative for blood in stool, constipation, diarrhea, nausea and vomiting.  ?Endocrine: Negative for cold intolerance, heat intolerance and polyuria.  ?Genitourinary:  Negative for dyspareunia, dysuria, flank pain, frequency, genital sores, hematuria, menstrual  problem, pelvic pain, urgency, vaginal bleeding, vaginal discharge and vaginal pain.  ?Musculoskeletal:  Negative for back pain, joint swelling and myalgias.  ?Skin:  Negative for rash.  ?Neurological:  Negative for dizziness, syncope, light-headedness, numbness and headaches.  ?Hematological:  Negative for adenopathy.  ?Psychiatric/Behavioral:  Positive for agitation and dysphoric mood. Negative for confusion, sleep disturbance and suicidal ideas. The patient is not nervous/anxious.  BREAST: No symptoms ? ? ?Objective: ?There were no vitals taken for this visit. ? ? ?Physical Exam ?Constitutional:   ?   Appearance: She is well-developed.  ?Genitourinary:  ?   Vulva normal.  ?   Right Labia: No rash, tenderness or lesions. ?   Left Labia: No tenderness, lesions or rash. ?   No vaginal discharge, erythema or tenderness.  ? ?   Right Adnexa: not tender and no mass present. ?   Left Adnexa: not tender and no mass present. ?   No cervical friability or polyp.  ?   IUD strings visualized.  ?   Uterus is not enlarged or tender.  ?Breasts: ?   Right: No mass, nipple discharge, skin change or tenderness.  ?   Left: No mass, nipple discharge, skin change or tenderness.  ?Neck:  ?   Thyroid: No thyromegaly.   ?Cardiovascular:  ?   Rate and Rhythm: Normal rate and regular rhythm.  ?   Heart sounds: Normal heart sounds. No murmur heard. ?Pulmonary:  ?   Effort: Pulmonary effort is normal.  ?   Breath sounds: Nor

## 2021-09-22 ENCOUNTER — Telehealth: Payer: Self-pay | Admitting: Physical Therapy

## 2021-09-22 NOTE — Telephone Encounter (Addendum)
Called patient to better understand scheduling needs since she was scheduled for very few visits after IE last week, so that PT can look out for openings that might work for her as they come up. Left VM requesting call back.  ? ?Luretha Murphy. Ilsa Iha, PT, DPT ?09/22/21, 1:18 PM ? ? ?Patient called back and left message with support staff that she her schedule was restricted due to not being able to bring her 27 year old son.  ? ?PT got permission/clarified with management that patient can bring son. Called patient back and agreed on appointment times.  ? ?Luretha Murphy. Ilsa Iha, PT, DPT ?09/22/21, 3:31 PM ? ?[facility] ?[address] ?[place], [place] [address] ?P: [phone] I F: [phone] ? ?[facility] ?[address] ?[place], [place] [address] ?P: [phone] I F: [phone] ? ?

## 2021-09-23 ENCOUNTER — Ambulatory Visit: Payer: Medicaid Other | Admitting: Physical Therapy

## 2021-09-23 ENCOUNTER — Encounter: Payer: Self-pay | Admitting: Physical Therapy

## 2021-09-23 DIAGNOSIS — M6281 Muscle weakness (generalized): Secondary | ICD-10-CM

## 2021-09-23 DIAGNOSIS — G8929 Other chronic pain: Secondary | ICD-10-CM

## 2021-09-23 DIAGNOSIS — M79672 Pain in left foot: Secondary | ICD-10-CM

## 2021-09-23 DIAGNOSIS — R202 Paresthesia of skin: Secondary | ICD-10-CM
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
## 2021-09-29 ENCOUNTER — Ambulatory Visit: Payer: Medicaid Other | Admitting: Physical Therapy

## 2021-10-07 ENCOUNTER — Ambulatory Visit: Payer: Medicaid Other | Attending: Podiatry | Admitting: Physical Therapy

## 2021-10-07 ENCOUNTER — Telehealth: Payer: Self-pay | Admitting: Physical Therapy

## 2021-10-07 NOTE — Telephone Encounter (Signed)
Called patient after she did not show up for her appointment scheduled at 11:15am today. Patient apologized and said her mother visited and patient thinks she caught an illness from her mother so she was in the bathroom all morning and she completely forgot about her PT appointment. She said she would not be able to come to the appointment scheduled for mon 4/17 at 9am and there was no more available appointments that would work with her schedule until her next scheduled time on Mon 4/24 at 2:30pm. Patient stated the best time for her to come is Tues - Fri between 11am and 3pm. Reviewed cancelation/no-show policy with patient and confirmed patient has correct phone number for clinic; patient verbalized understanding.  ? ?Luretha Murphy. Ilsa Iha, PT, DPT ?10/07/21, 2:11 PM ? ?[facility] ?[address] ?[place], [place] [address] ?P: [phone] I F: [phone] ? ? ?

## 2021-10-12 ENCOUNTER — Encounter: Payer: Medicaid Other | Admitting: Physical Therapy

## 2021-10-14 ENCOUNTER — Ambulatory Visit (INDEPENDENT_AMBULATORY_CARE_PROVIDER_SITE_OTHER): Payer: Medicaid Other | Admitting: Obstetrics and Gynecology

## 2021-10-14 ENCOUNTER — Encounter: Payer: Self-pay | Admitting: Obstetrics and Gynecology

## 2021-10-14 VITALS — BP 120/80 | Ht 66.0 in | Wt 227.0 lb

## 2021-10-14 DIAGNOSIS — R21 Rash and other nonspecific skin eruption: Secondary | ICD-10-CM | POA: Diagnosis not present

## 2021-10-14 DIAGNOSIS — Z3169 Encounter for other general counseling and advice on procreation: Secondary | ICD-10-CM

## 2021-10-14 DIAGNOSIS — F419 Anxiety disorder, unspecified: Secondary | ICD-10-CM

## 2021-10-14 DIAGNOSIS — Z01419 Encounter for gynecological examination (general) (routine) without abnormal findings: Secondary | ICD-10-CM | POA: Diagnosis not present

## 2021-10-14 DIAGNOSIS — F32A Depression, unspecified: Secondary | ICD-10-CM

## 2021-10-14 NOTE — Patient Instructions (Signed)
I value your feedback and you entrusting us with your care. If you get a [place] patient survey, I would appreciate you taking the time to let us know about your experience today. Thank you! ? ? ?

## 2021-10-14 NOTE — Progress Notes (Signed)
? ?PCP:  Patient, No Pcp Per (Inactive) ? ? ?Chief Complaint  ?Patient presents with  ?? Gynecologic Exam  ?  Left nipple has been itching for the past 3 months, rash on left breast  ? ? ? ?HPI: ?     [name] is a 26 y.o. G1P1001 who LMP was Patient's last menstrual period was 09/27/2021 (exact date)., presents today for her annual examination.  Her menses are regular every 28-30 days, lasting 3 days.  Dysmenorrhea mild, improved with NSAIDs. She does not have intermenstrual bleeding. ? ?Sex activity: single partner, contraception - none, trying to conceive for over a year. Not taking PNVs. Partner is FOB of their 3 yr son. Pt has had positive urine ovulation pred kits, s/p SVD. Was seeing DR. Staebler for infertility; had neg labs for PCOS. Next step is clomid/femara per pt but pt has a lot going on right now and wants to wait a few months.  ? ?Last Pap: 09/17/20 Results were: no abnormalities   ? ?There is no FH of breast cancer. There is no FH of ovarian cancer. The patient does self-breast exams. LT nipple and breast has been itching for a few months and skin feels rough. Did change dryer sheets recently. No breast masses.  ? ?Tobacco use: quit cigs ?Alcohol use: none ?No drug use.  ?Exercise: min active ? ?She does get adequate calcium and Vitamin D in her diet. ? ?Hx of anxiety/ depression sx; initially seen 11/20. Was on zoloft 150 mg but didn't help with sx and pt didn't feel well. Changed to buspar 10 mg BID and doing well. Takes 1-2 times daily with sx relief. No depression sx. Plans to see therapist. Anxiety is biggest sx for pt.  Wants to conceive so limited in tx options. Hx of ADHD in past as well, didn't do well with adderall.  ? ? ?Past Medical History:  ?Diagnosis Date  ?? ADD (attention deficit disorder)   ?? Anemia   ?? Anxiety and depression 05/10/2019  ?? Asthma   ? WELL CONTROLLED  ?? GERD (gastroesophageal reflux disease)   ? OCC TUMS PRN  ?? Headache   ? MIGRAINES  ?? History  of substance abuse (HCC)   ? ? ?Past Surgical History:  ?Procedure Laterality Date  ?? ENDOSCOPIC CONCHA BULLOSA RESECTION Bilateral 07/28/2021  ? Procedure: ENDOSCOPIC CONCHA BULLOSA RESECTION;  Surgeon: Geanie Logan, MD;  Location: [facility];  Service: ENT;  Laterality: Bilateral;  ?? ETHMOIDECTOMY Bilateral 07/28/2021  ? Procedure: ANTERIOR ETHMOIDECTOMY;  Surgeon: Geanie Logan, MD;  Location: [facility];  Service: ENT;  Laterality: Bilateral;  ?? FOOT SURGERY  10/2019  ?? FOOT SURGERY  11/28/2020  ?? IMAGE GUIDED SINUS SURGERY N/A 07/28/2021  ? Procedure: IMAGE GUIDED SINUS SURGERY;  Surgeon: Geanie Logan, MD;  Location: [facility];  Service: ENT;  Laterality: N/A;  need stryker disk ?placed disk on OR charge nurse desk 1-16 kp  ?? MAXILLARY ANTROSTOMY Bilateral 07/28/2021  ? Procedure: ENDOSCOPIC MAXILLARY ANTROSTOMY WITH TISSUE REMOVAL;  Surgeon: Geanie Logan, MD;  Location: [facility];  Service: ENT;  Laterality: Bilateral;  ?? MYRINGOTOMY WITH TUBE PLACEMENT Left 08/16/2018  ? Procedure: MYRINGOTOMY WITH TUBE PLACEMENT;  Surgeon: Geanie Logan, MD;  Location: [facility] ORS;  Service: ENT;  Laterality: Left;  ?? TONSILLECTOMY  2014  ? Pt not sure if addenoids taken  ?? TYMPANOPLASTY WITH GRAFT Right 08/16/2018  ? Procedure: TYMPANOMASTOIDECTOMY WITH POSSIBLE OSSICULAR GRAFT;  Surgeon: Geanie Logan, MD;  Location: [facility] ORS;  Service: ENT;  Laterality: Right;  ?? WISDOM TOOTH EXTRACTION  2014  ? ? ?Family History  ?Problem Relation Age of Onset  ?? Hypertension Mother   ?? Hypertension Father   ?? Hypertension Maternal Aunt   ?? Hypertension Maternal Uncle   ?? Hypertension Paternal Aunt   ?? Diabetes Paternal Aunt   ?? Hypertension Paternal Uncle   ?? Hypertension Maternal Grandmother   ?? Diabetes Maternal Grandmother   ?? Thyroid disease Maternal Grandmother   ?? Hypertension Maternal Grandfather   ?? Thyroid disease Maternal Grandfather   ?? Hypertension Paternal Grandmother    ?? Thyroid disease Paternal Grandmother   ?? Hypertension Paternal Grandfather   ?? Thyroid disease Paternal Grandfather   ? ? ?Social History  ? ?Socioeconomic History  ?? Marital status: Married  ?  Spouse name: Not on file  ?? Number of children: 0  ?? Years of education: 5412  ?? Highest education level: Not on file  ?Occupational History  ?? Occupation: Conservation officer, naturecashier  ?  Comment: [employer]  ?? Occupation: call center  ?Tobacco Use  ?? Smoking status: Some Days  ?  Types: E-cigarettes  ?? Smokeless tobacco: Never  ?Vaping Use  ?? Vaping Use: Former  ?Substance and Sexual Activity  ?? Alcohol use: No  ?? Drug use: Yes  ?  Types: Marijuana  ?  Comment: hx of cocaine, meth, and acid use-Last used 2017  ?? Sexual activity: Yes  ?  Birth control/protection: None  ?Other Topics Concern  ?? Not on file  ?Social History Narrative  ?? Not on file  ? ?Social Determinants of Health  ? ?Financial Resource Strain: Not on file  ?Food Insecurity: Not on file  ?Transportation Needs: Not on file  ?Physical Activity: Not on file  ?Stress: Not on file  ?Social Connections: Not on file  ?Intimate Partner Violence: Not on file  ? ? ? ?Current Outpatient Medications:  ??  Azelastine HCl 137 MCG/SPRAY SOLN, SMARTSIG:1-2 Spray(s) Both Nares Twice Daily, Disp: , Rfl:  ??  Biotin 10 MG TABS, Take by mouth daily., Disp: , Rfl:  ??  busPIRone (BUSPAR) 10 MG tablet, Take 1 tablet (10 mg total) by mouth 2 (two) times daily., Disp: 180 tablet, Rfl: 2 ??  Cholecalciferol (D3-1000) 25 MCG (1000 UT) tablet, Take 2,000 Units by mouth daily., Disp: , Rfl:  ??  cyclobenzaprine (FLEXERIL) 10 MG tablet, Take 1 tablet (10 mg total) by mouth 3 (three) times daily as needed for muscle spasms., Disp: 90 tablet, Rfl: 1 ??  fluticasone (FLONASE) 50 MCG/ACT nasal spray, Place 2 sprays into both nostrils daily., Disp: , Rfl:  ??  meloxicam (MOBIC) 15 MG tablet, TAKE 1 TABLET BY MOUTH EVERY DAY, Disp: 30 tablet, Rfl: 3 ??  montelukast (SINGULAIR) 10 MG tablet, Take  10 mg by mouth daily., Disp: , Rfl:  ??  ondansetron (ZOFRAN) 4 MG tablet, Take 4 mg by mouth every 8 (eight) hours as needed., Disp: , Rfl:  ? ? ? ? ?ROS: ? ?Review of Systems  ?Constitutional:  Negative for fatigue, fever and unexpected weight change.  ?Respiratory:  Negative for cough, shortness of breath and wheezing.   ?Cardiovascular:  Negative for chest pain, palpitations and leg swelling.  ?Gastrointestinal:  Negative for blood in stool, constipation, diarrhea, nausea and vomiting.  ?Endocrine: Negative for cold intolerance, heat intolerance and polyuria.  ?Genitourinary:  Negative for dyspareunia, dysuria, flank pain, frequency, genital sores, hematuria, menstrual problem, pelvic pain, urgency, vaginal bleeding,  vaginal discharge and vaginal pain.  ?Musculoskeletal:  Negative for back pain, joint swelling and myalgias.  ?Skin:  Negative for rash.  ?Neurological:  Negative for dizziness, syncope, light-headedness, numbness and headaches.  ?Hematological:  Negative for adenopathy.  ?Psychiatric/Behavioral:  Positive for agitation. Negative for confusion, dysphoric mood, sleep disturbance and suicidal ideas. The patient is not nervous/anxious.  BREAST: No symptoms ? ? ?Objective: ?BP 120/80   Ht 5\' 6"  (1.676 m)   Wt 227 lb (103 kg)   LMP 09/27/2021 (Exact Date)   BMI 36.64 kg/m?  ? ? ?Physical Exam ?Constitutional:   ?   Appearance: She is well-developed.  ?Genitourinary:  ?   Vulva normal.  ?   Right Labia: No rash, tenderness or lesions. ?   Left Labia: No tenderness, lesions or rash. ?   No vaginal discharge, erythema or tenderness.  ? ?   Right Adnexa: not tender and no mass present. ?   Left Adnexa: not tender and no mass present. ?   No cervical friability or polyp.  ?   IUD strings visualized.  ?   Uterus is not enlarged or tender.  ?Breasts: ?   Right: No mass, nipple discharge, skin change or tenderness.  ?   Left: No mass, nipple discharge, skin change or tenderness.  ?Neck:  ?   Thyroid: No  thyromegaly.  ?Cardiovascular:  ?   Rate and Rhythm: Normal rate and regular rhythm.  ?   Heart sounds: Normal heart sounds. No murmur heard. ?Pulmonary:  ?   Effort: Pulmonary effort is normal.  ?   Breath

## 2021-10-19 ENCOUNTER — Encounter: Payer: Self-pay | Admitting: Podiatry

## 2021-10-19 ENCOUNTER — Ambulatory Visit: Payer: Medicaid Other | Admitting: Physical Therapy

## 2021-10-21 ENCOUNTER — Ambulatory Visit: Payer: Medicaid Other | Admitting: Physical Therapy

## 2021-10-26 ENCOUNTER — Encounter: Payer: Medicaid Other | Admitting: Physical Therapy
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
## 2021-11-09 ENCOUNTER — Ambulatory Visit: Payer: Medicaid Other | Attending: Podiatry | Admitting: Physical Therapy

## 2021-11-09 ENCOUNTER — Encounter: Payer: Self-pay | Admitting: Physical Therapy
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
## 2021-12-22 ENCOUNTER — Encounter: Payer: Self-pay | Admitting: Obstetrics and Gynecology

## 2021-12-22 ENCOUNTER — Other Ambulatory Visit: Payer: Self-pay | Admitting: Obstetrics and Gynecology

## 2021-12-22 DIAGNOSIS — F419 Anxiety disorder, unspecified: Secondary | ICD-10-CM

## 2022-01-02 ENCOUNTER — Other Ambulatory Visit: Payer: Self-pay | Admitting: Podiatry

## 2022-01-05 NOTE — Telephone Encounter (Signed)
Please advise 

## 2022-02-08 ENCOUNTER — Ambulatory Visit: Payer: BLUE CROSS/BLUE SHIELD

## 2022-02-08 ENCOUNTER — Ambulatory Visit (LOCAL_COMMUNITY_HEALTH_CENTER): Payer: Self-pay

## 2022-02-08 VITALS — BP 125/79 | Ht 66.0 in | Wt 216.5 lb

## 2022-02-08 DIAGNOSIS — Z3202 Encounter for pregnancy test, result negative: Secondary | ICD-10-CM

## 2022-02-08 LAB — PREGNANCY, URINE: Preg Test, Ur: NEGATIVE

## 2022-02-08 NOTE — Progress Notes (Signed)
UPT negative today. LMP 01/17/2022. Reports nausea and tiredness. Had positive home UPT last Thurs (02/04/22) and negative home UPT yesterday. Pt explains trying for preg. RN counseled to return for UPT when misses period and to f-u with PCP as needed. Jerel Shepherd, RN

## 2022-02-08 NOTE — Progress Notes (Deleted)
Subjective:    Beth Willis is a 26 y.o. female who presents for evaluation of amenorrhea. She believes she could be pregnant. {pregnancy desire:14500::"Pregnancy is desired."}  Last period was {norm/abn:16337}.   No LMP recorded.    Lab Review Urine HCG: {pos/neg:315643}    Assessment:    Absence of menstruation.     Plan:    Pregnancy Test:  Positive: EDC: ***. Briefly discussed positive results and sent to check out for scheduling of New OB appointments.

## 2022-02-14 ENCOUNTER — Other Ambulatory Visit: Payer: Self-pay | Admitting: Obstetrics and Gynecology

## 2022-02-14 DIAGNOSIS — F419 Anxiety disorder, unspecified: Secondary | ICD-10-CM

## 2022-03-18 ENCOUNTER — Other Ambulatory Visit: Payer: Self-pay | Admitting: Obstetrics and Gynecology

## 2022-03-18 DIAGNOSIS — F32A Depression, unspecified: Secondary | ICD-10-CM

## 2022-04-18 ENCOUNTER — Other Ambulatory Visit: Payer: Self-pay | Admitting: Obstetrics and Gynecology

## 2022-04-18 DIAGNOSIS — F419 Anxiety disorder, unspecified: Secondary | ICD-10-CM

## 2022-05-18 ENCOUNTER — Other Ambulatory Visit: Payer: Self-pay | Admitting: Obstetrics and Gynecology

## 2022-05-18 DIAGNOSIS — F419 Anxiety disorder, unspecified: Secondary | ICD-10-CM

## 2022-05-22 ENCOUNTER — Other Ambulatory Visit: Payer: Self-pay | Admitting: Podiatry

## 2022-06-17 ENCOUNTER — Encounter: Payer: Self-pay | Admitting: Obstetrics and Gynecology

## 2022-06-25 ENCOUNTER — Emergency Department
Admission: EM | Admit: 2022-06-25 | Discharge: 2022-06-25 | Disposition: A | Discharge status: Home / Self Care | Payer: BLUE CROSS/BLUE SHIELD | Attending: Emergency Medicine | Admitting: Emergency Medicine

## 2022-06-25 ENCOUNTER — Encounter: Payer: Self-pay | Admitting: Emergency Medicine

## 2022-06-25 ENCOUNTER — Emergency Department: Payer: BLUE CROSS/BLUE SHIELD

## 2022-06-25 ENCOUNTER — Other Ambulatory Visit: Payer: Self-pay

## 2022-06-25 DIAGNOSIS — O99511 Diseases of the respiratory system complicating pregnancy, first trimester: Secondary | ICD-10-CM | POA: Diagnosis not present

## 2022-06-25 DIAGNOSIS — Z3A01 Less than 8 weeks gestation of pregnancy: Secondary | ICD-10-CM | POA: Insufficient documentation

## 2022-06-25 DIAGNOSIS — J101 Influenza due to other identified influenza virus with other respiratory manifestations: Secondary | ICD-10-CM | POA: Diagnosis not present

## 2022-06-25 DIAGNOSIS — O210 Mild hyperemesis gravidarum: Secondary | ICD-10-CM | POA: Diagnosis not present

## 2022-06-25 DIAGNOSIS — O219 Vomiting of pregnancy, unspecified: Secondary | ICD-10-CM | POA: Diagnosis present

## 2022-06-25 LAB — CBC WITH DIFFERENTIAL/PLATELET
Abs Immature Granulocytes: 0.02 10*3/uL (ref 0.00–0.07)
Basophils Absolute: 0 10*3/uL (ref 0.0–0.1)
Basophils Relative: 0 %
Eosinophils Absolute: 0 10*3/uL (ref 0.0–0.5)
Eosinophils Relative: 0 %
HCT: 39.9 % (ref 36.0–46.0)
Hemoglobin: 13.1 g/dL (ref 12.0–15.0)
Immature Granulocytes: 0 %
Lymphocytes Relative: 24 %
Lymphs Abs: 2.3 10*3/uL (ref 0.7–4.0)
MCH: 27.9 pg (ref 26.0–34.0)
MCHC: 32.8 g/dL (ref 30.0–36.0)
MCV: 85.1 fL (ref 80.0–100.0)
Monocytes Absolute: 0.6 10*3/uL (ref 0.1–1.0)
Monocytes Relative: 6 %
Neutro Abs: 6.4 10*3/uL (ref 1.7–7.7)
Neutrophils Relative %: 70 %
Platelets: 215 10*3/uL (ref 150–400)
RBC: 4.69 MIL/uL (ref 3.87–5.11)
RDW: 13 % (ref 11.5–15.5)
WBC: 9.3 10*3/uL (ref 4.0–10.5)
nRBC: 0 % (ref 0.0–0.2)

## 2022-06-25 LAB — URINALYSIS, ROUTINE W REFLEX MICROSCOPIC
Bilirubin Urine: NEGATIVE
Glucose, UA: NEGATIVE mg/dL
Hgb urine dipstick: NEGATIVE
Ketones, ur: NEGATIVE mg/dL
Leukocytes,Ua: NEGATIVE
Nitrite: NEGATIVE
Protein, ur: NEGATIVE mg/dL
Specific Gravity, Urine: 1.023 (ref 1.005–1.030)
pH: 6 (ref 5.0–8.0)

## 2022-06-25 LAB — HEPATIC FUNCTION PANEL
ALT: 22 U/L (ref 0–44)
AST: 21 U/L (ref 15–41)
Albumin: 4 g/dL (ref 3.5–5.0)
Alkaline Phosphatase: 56 U/L (ref 38–126)
Bilirubin, Direct: <0.1 mg/dL (ref 0.0–0.2)
Total Bilirubin: 0.3 mg/dL (ref 0.3–1.2)
Total Protein: 7 g/dL (ref 6.5–8.1)

## 2022-06-25 LAB — BASIC METABOLIC PANEL
Anion gap: 7 (ref 5–15)
BUN: 7 mg/dL (ref 6–20)
CO2: 23 mmol/L (ref 22–32)
Calcium: 9.2 mg/dL (ref 8.9–10.3)
Chloride: 108 mmol/L (ref 98–111)
Creatinine, Ser: 0.61 mg/dL (ref 0.44–1.00)
GFR, Estimated: >60 mL/min (ref >60)
Glucose, Bld: 96 mg/dL (ref 70–99)
Potassium: 4.1 mmol/L (ref 3.5–5.1)
Sodium: 138 mmol/L (ref 135–145)

## 2022-06-25 LAB — HCG, QUANTITATIVE, PREGNANCY: hCG, Beta Chain, Quant, S: 128112 m[IU]/mL — ABNORMAL HIGH (ref <5)

## 2022-06-25 MED ORDER — ONDANSETRON 4 MG PO TBDP
4.0000 mg | ORAL_TABLET | Freq: Once | ORAL | Status: AC
  Administered 2022-06-25: 4 mg via ORAL
  Filled 2022-06-25: qty 1

## 2022-06-25 MED ORDER — ONDANSETRON 4 MG PO TBDP
4.0000 mg | ORAL_TABLET | Freq: Three times a day (TID) | ORAL | 0 refills | Status: DC | PRN
Start: 2022-06-25 — End: 2022-12-12

## 2022-06-25 NOTE — ED Provider Triage Note (Signed)
Emergency Medicine Provider Triage Evaluation Note  Beth Willis , a 26 y.o. female  G2P1 was evaluated in triage.  Pt complains of n/v. Diagnosed with flu on 12/24, started on tamiflu. Now with n/v, multiple episdoes of vomiting since and she feels dehydrated. Reports that she is approx 2 months pregnant, has not yet had [place]. Has RLQ pain. No vaginal bleeding.   Review of Systems  Positive: Abd pain, n/v/d Negative: Fever, vag bleeding  Physical Exam  There were no vitals taken for this visit. Gen:   Awake, no distress   Resp:  Normal effort  MSK:   Moves extremities without difficulty  Other:    Medical Decision Making  Medically screening exam initiated at 2:13 PM.  Appropriate orders placed.  India Mahajan was informed that the remainder of the evaluation will be completed by another provider, this initial triage assessment does not replace that evaluation, and the importance of remaining in the ED until their evaluation is complete.     Jackelyn Hoehn, PA-C 06/25/22 1416

## 2022-06-25 NOTE — ED Provider Notes (Signed)
[facility] Emergency Department Provider Note     Event Date/Time   First MD Initiated Contact with Patient 06/25/22 1543     (approximate)   History   Emesis (Flu +)   HPI  Beth Willis is a 26 y.o. female  complains of n/v. Diagnosed with flu on 12/24, started on tamiflu. Now with n/v, multiple episdoes of vomiting since and she feels dehydrated. Reports that she is approx 2 months pregnant, has not yet had [place]. Has RLQ pain. No vaginal bleeding.     Physical Exam   Triage Vital Signs: ED Triage Vitals [06/25/22 1415]  Enc Vitals Group     BP 124/83     Pulse Rate 70     Resp 18     Temp 98.8 F (37.1 C)     Temp Source Oral     SpO2 100 %     Weight 216 lb (98 kg)     Height 5\' 6"  (1.676 m)     Head Circumference      Peak Flow      Pain Score 4     Pain Loc      Pain Edu?      Excl. in GC?     Most recent vital signs: Vitals:   06/25/22 1415  BP: 124/83  Pulse: 70  Resp: 18  Temp: 98.8 F (37.1 C)  SpO2: 100%    General Awake, no distress. NAD HEENT NCAT. PERRL. EOMI. No rhinorrhea. Mucous membranes are moist.  CV:  Good peripheral perfusion.  RESP:  Normal effort.  ABD:  No distention.    ED Results / Procedures / Treatments   Labs (all labs ordered are listed, but only abnormal results are displayed) Labs Reviewed  HCG, QUANTITATIVE, PREGNANCY - Abnormal; Notable for the following components:      Result Value   hCG, Beta Chain, Quant, S 128,112 (*)    All other components within normal limits  URINALYSIS, ROUTINE W REFLEX MICROSCOPIC - Abnormal; Notable for the following components:   Color, Urine YELLOW (*)    APPearance HAZY (*)    Bacteria, UA RARE (*)    All other components within normal limits  CBC WITH DIFFERENTIAL/PLATELET  BASIC METABOLIC PANEL  HEPATIC FUNCTION PANEL    EKG   RADIOLOGY  I personally viewed and evaluated these images as part of my medical decision making, as well as  reviewing the written report by the radiologist.  ED Provider Interpretation: Single IUP  06/27/22 OB LESS THAN 14 WEEKS WITH OB TRANSVAGINAL  Result Date: 06/25/2022 CLINICAL DATA:  Right lower quadrant abdominal pain EXAM: OBSTETRIC <14 WK 06/27/2022 AND TRANSVAGINAL OB US TECHNIQUE: Both transabdominal and transvaginal ultrasound examinations were performed for complete evaluation of the gestation as well as the maternal uterus, adnexal regions, and pelvic cul-de-sac. Transvaginal technique was performed to assess early pregnancy. COMPARISON:  Pelvic ultrasound 06/03/2021 FINDINGS: Intrauterine gestational sac: Single Yolk sac:  Visualized. Embryo:  Visualized. Cardiac Activity: Visualized. Heart Rate: 127 bpm CRL:  8.5 mm   6 w   6 d                  14/12/2020 EDC: 01/27/2023 Subchorionic hemorrhage:  None visualized. Maternal uterus/adnexae: Normal ovaries with corpus luteal cyst in the left ovary. IMPRESSION: Single viable intrauterine pregnancy. Estimated gestational age 109 weeks 6 days. Electronically Signed   By: 03/29/2023 M.D.   On: 06/25/2022 17:21  PROCEDURES:  Critical Care performed: No  Procedures   MEDICATIONS ORDERED IN ED: Medications  ondansetron (ZOFRAN-ODT) disintegrating tablet 4 mg (4 mg Oral Given 06/25/22 1611)     IMPRESSION / MDM / ASSESSMENT AND PLAN / ED COURSE  I reviewed the triage vital signs and the nursing notes.                              Differential diagnosis includes, but is not limited to, influenza, dehydration, hyperemesis gravidarum, electrolyte abnormality  Patient's presentation is most consistent with acute complicated illness / injury requiring diagnostic workup.  Female patient at first trimester pregnancy, presents with hyperemesis gravidarum.  Patient completed 5-day course of Tamiflu provided by local urgent care with her diagnosis.  She reported ongoing nausea and vomiting.  Denies any fevers, chills, chest pain, shortness of breath.  Patient's  diagnosis is consistent with flu and hyperemesis gravidarum.  With a stable exam, workup, no evidence of dehydration, toxic appearance, or acute pregnancy related complaint.  Patient will be discharged home with prescriptions for Zofran. Patient is to follow up with [facility] provider as needed or otherwise directed. Patient is given ED precautions to return to the ED for any worsening or new symptoms.     FINAL CLINICAL IMPRESSION(S) / ED DIAGNOSES   Final diagnoses:  Influenza A  Hyperemesis gravidarum     Rx / DC Orders   ED Discharge Orders          Ordered    ondansetron (ZOFRAN-ODT) 4 MG disintegrating tablet  Every 8 hours PRN        06/25/22 1742             Note:  This document was prepared using Dragon voice recognition software and may include unintentional dictation errors.    Lissa Hoard, PA-C 06/25/22 2330    Minna Antis, MD 06/25/22 2352

## 2022-06-25 NOTE — Discharge Instructions (Signed)
Take the nausea medicine as needed. Follow-up with [facility] as planned.

## 2022-06-25 NOTE — ED Triage Notes (Signed)
Patient arrives ambulatory by POV- diagnosed with flu since Monday. C/o emesis and dehydration. Patient approx 2 months pregnant. Has not seen obgyn yet.

## 2022-06-30 ENCOUNTER — Ambulatory Visit: Payer: BLUE CROSS/BLUE SHIELD

## 2022-07-01 ENCOUNTER — Encounter: Payer: Self-pay | Admitting: Obstetrics and Gynecology

## 2022-07-01 ENCOUNTER — Other Ambulatory Visit: Payer: Self-pay | Admitting: Licensed Practical Nurse

## 2022-07-01 DIAGNOSIS — O219 Vomiting of pregnancy, unspecified: Secondary | ICD-10-CM

## 2022-07-01 MED ORDER — PROMETHAZINE HCL 25 MG PO TABS: 25.0000 mg | ORAL_TABLET | Freq: Four times a day (QID) | ORAL | 3 refills | Status: DC | PRN

## 2022-07-01 NOTE — Progress Notes (Signed)
Pt sent MyChart message regarding SOB, weakness and inability to eat TC to [place] was diagnosed with the flu on 12/24, she took tamiflu, she started to feel better but now has worsening SOB and fatigue, it takes tremendous energy to so household tasks. Even walking around the house is a lot of work. It feels like an elephant in on her chest.  She was diagnosed at [facility] with Asthma and given an inhaler a while ago.  She has tried the inhaler-it helps only for a little bit of time. She has recently been diagnosed with sleep apnea and now has a CPAP machine that  she  is trying to adjust too.   She was seen in the ED for similar complaints plus N/V, she was told she  has Hyperemesis Gravidum and was given Zofran. She has been taking the Zofran, it helps  a little. But she continues to have episodes of vomiting.  She is not able to eat or drink much in one sitting. She has been sipping on an Propel water.  Offered Phenergan PR or PO, prefers  oral route.   Reviewed SOB, pressure on the chest, and easily tired could be concerning  for her heart. Although recovering from the flu, early  pregnancy and sleep apnea could account for these symptoms. Recommended being seen by   a Provider. [place] states she will go the ED once her husband is home.   Script for Phenergan sent to pharmacy, may need to consider admission to the hospital if no improvement.   Pt was able to speak clearly without with exertion or catching breadth while om the phone. Pt admits she is fine when sitting, it is only when she needs to move around that she has SOB and is profoundly tired.   Roberto Scales, [facility]  07/01/22  3:27 PM

## 2022-07-05 ENCOUNTER — Emergency Department
Admission: EM | Admit: 2022-07-05 | Discharge: 2022-07-05 | Disposition: A | Discharge status: Home / Self Care | Payer: BLUE CROSS/BLUE SHIELD | Attending: Emergency Medicine | Admitting: Emergency Medicine

## 2022-07-05 ENCOUNTER — Ambulatory Visit (INDEPENDENT_AMBULATORY_CARE_PROVIDER_SITE_OTHER): Payer: BLUE CROSS/BLUE SHIELD

## 2022-07-05 ENCOUNTER — Other Ambulatory Visit: Payer: Self-pay

## 2022-07-05 VITALS — Ht 67.0 in | Wt 213.0 lb

## 2022-07-05 DIAGNOSIS — Z369 Encounter for antenatal screening, unspecified: Secondary | ICD-10-CM

## 2022-07-05 DIAGNOSIS — Z3480 Encounter for supervision of other normal pregnancy, unspecified trimester: Secondary | ICD-10-CM

## 2022-07-05 DIAGNOSIS — K5641 Fecal impaction: Secondary | ICD-10-CM | POA: Insufficient documentation

## 2022-07-05 DIAGNOSIS — O0990 Supervision of high risk pregnancy, unspecified, unspecified trimester: Secondary | ICD-10-CM | POA: Insufficient documentation

## 2022-07-05 DIAGNOSIS — O99611 Diseases of the digestive system complicating pregnancy, first trimester: Secondary | ICD-10-CM | POA: Diagnosis not present

## 2022-07-05 DIAGNOSIS — Z3689 Encounter for other specified antenatal screening: Secondary | ICD-10-CM

## 2022-07-05 MED ORDER — NITROFURANTOIN MONOHYD MACRO 100 MG PO CAPS: 100.0000 mg | ORAL_CAPSULE | Freq: Two times a day (BID) | ORAL | 0 refills | Status: AC

## 2022-07-05 MED ORDER — GLYCERIN (ADULT) 2 G RE SUPP: 1.0000 | RECTAL | 0 refills | Status: DC | PRN

## 2022-07-05 MED ORDER — LIDOCAINE 5 % EX OINT: 1.0000 | TOPICAL_OINTMENT | CUTANEOUS | 0 refills | Status: DC | PRN

## 2022-07-05 NOTE — ED Triage Notes (Signed)
Pt reports constipation from taking zofran. Last BM last night. Has tried prune juice, mag citrate

## 2022-07-05 NOTE — Discharge Instructions (Addendum)
Take laxatives and suppositories as prescribed.   Thank you for choosing [place] for your health care today!  Please see your primary doctor this week for a follow up appointment.   Sometimes, in the early stages of certain disease courses it is difficult to detect in the emergency department evaluation -- so, it is important that you continue to monitor your symptoms and call your doctor right away or return to the emergency department if you develop any new or worsening symptoms.  Please go to the following website to schedule new (and existing) patient appointments:   [email or web address]  If you do not have a primary doctor try calling the following clinics to establish care:  If you have insurance:  [facility] [phone] [place] [address]   [facility]  [phone] [address] [place] [address]   If you do not have insurance:  [facility]  [phone] [address], [place] [address]   The following is another list of primary care offices in the area who are accepting new patients at this time.  Please reach out to one of them directly and let them know you would like to schedule an appointment to follow up on an Emergency Department visit, and/or to establish a new primary care provider (PCP).  There are likely other primary care clinics in the are who are accepting new patients, but this is an excellent place to start:  [place] physician: Dr Lavon Paganini [address] [place], [place] [address] [phone]  [facility] Lead Physician: Dr Steele Sizer [address], [place], [place] [address] [phone]  [place] Physician: Dr Park Liter [address] [place], [place] [address] [phone]  [facility] Lead Physician: Dr Dewaine Oats [place], [place], [place]  [address] [phone]  [facility] Physician: Dr Halina Maidens [address] [place],  [address] [phone]   It was my pleasure to care for you today.   Hoover Brunette Jacelyn Grip, MD

## 2022-07-05 NOTE — ED Provider Notes (Signed)
[facility] Provider Note    Event Date/Time   First MD Initiated Contact with Patient 07/05/22 1353     (approximate)   History   Constipation   HPI  Beth Willis is a 27 y.o. female   Past medical history of approximately [redacted] weeks pregnant who presents emergency department with constipation.  No bowel movement for 7 days.  Is passing gas, no history of abdominal surgeries.  No urinary complaints.  No other acute medical complaints.   History was obtained via the patient. I reviewed external medical notes including a obstetrics note dated 07/01/2022 stating some shortness of breath, no complaints of the same today.  Has been taking Zofran for hyperemesis gravidarum.  Today nausea and vomiting not a complaint.  Tolerating p.o.     Physical Exam   Triage Vital Signs: ED Triage Vitals  Enc Vitals Group     BP 07/05/22 0910 (!) 137/97     Pulse Rate 07/05/22 0909 84     Resp 07/05/22 0909 18     Temp 07/05/22 0909 98.7 F (37.1 C)     Temp src --      SpO2 07/05/22 0909 99 %     Weight 07/05/22 0909 213 lb (96.6 kg)     Height 07/05/22 0909 5\' 7"  (1.702 m)     Head Circumference --      Peak Flow --      Pain Score 07/05/22 0909 10     Pain Loc --      Pain Edu? --      Excl. in [place]? --     Most recent vital signs: Vitals:   07/05/22 0909 07/05/22 0910  BP:  (!) 137/97  Pulse: 84   Resp: 18   Temp: 98.7 F (37.1 C)   SpO2: 99%     General: Awake, no distress.  CV:  Good peripheral perfusion.  Resp:  Normal effort.  Abd:  No distention.  Other:  Soft nontender abdomen.  Rectal exam with hard stool ball that is barely reachable with my finger, see procedure note   ED Results / Procedures / Treatments   Labs (all labs ordered are listed, but only abnormal results are displayed) Labs Reviewed - No data to display       PROCEDURES:  Critical Care performed: No  Fecal disimpaction  Date/Time: 07/05/2022 3:24  PM  Performed by: Lucillie Garfinkel, MD Authorized by: Lucillie Garfinkel, MD  Consent: Verbal consent obtained. Risks and benefits: risks, benefits and alternatives were discussed Consent given by: patient Patient understanding: patient states understanding of the procedure being performed Patient consent: the patient's understanding of the procedure matches consent given Procedure consent: procedure consent matches procedure scheduled Relevant documents: relevant documents present and verified Patient identity confirmed: verbally with patient Time out: Immediately prior to procedure a "time out" was called to verify the correct patient, procedure, equipment, support staff and site/side marked as required. Preparation: Patient was prepped and draped in the usual sterile fashion. Local anesthesia used: no  Anesthesia: Local anesthesia used: no  Sedation: Patient sedated: no  Patient tolerance: patient tolerated the procedure well with no immediate complications      MEDICATIONS ORDERED IN ED: Medications - No data to display   IMPRESSION / MDM / [place] / ED COURSE  I reviewed the triage vital signs and the nursing notes.  Differential diagnosis includes, but is not limited to, fecal impaction, constipation, obstruction     MDM: The patient that is refractory to several laxatives patient has taken.  I felt a hard stool ball was able to remove approximately 1 golf ball sized hard stool, the patient then proceeded to use the bathroom and made a large bowel movement afterwards.  Feels much improved. I prescribed Macrobid antibiotic as prophylaxis given the procedure and exposure to fecal matter today. Will follow-up with gynecology.    Patient's presentation is most consistent with acute presentation with potential threat to life or bodily function.       FINAL CLINICAL IMPRESSION(S) / ED DIAGNOSES   Final diagnoses:  Fecal impaction in  rectum (HCC)     Rx / DC Orders   ED Discharge Orders          Ordered    nitrofurantoin, macrocrystal-monohydrate, (MACROBID) 100 MG capsule  2 times daily        07/05/22 1521    glycerin adult 2 g suppository  As needed        07/05/22 1521    lidocaine (XYLOCAINE) 5 % ointment  As needed        07/05/22 1521    Ambulatory referral to Gastroenterology        07/05/22 1521             Note:  This document was prepared using Dragon voice recognition software and may include unintentional dictation errors.    Pilar Jarvis, MD 07/05/22 [phone]
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
## 2022-07-05 NOTE — ED Provider Triage Note (Signed)
Emergency Medicine Provider Triage Evaluation Note  Beth Willis , a 27 y.o. female  was evaluated in triage.  Pt complains of constipation for 7 days. Was told by OB/GYN to come to ED.  Patient has used otc, and prune juice without improvement.  Patient is [redacted] weeks pregnant.    Review of Systems  Positive: + pregnant Negative:   Physical Exam  BP (!) 137/97   Pulse 84   Temp 98.7 F (37.1 C)   Resp 18   Ht 5\' 7"  (1.702 m)   Wt 96.6 kg   LMP 05/09/2022 (Exact Date) Comment: approx 2 months pregnant  SpO2 99%   BMI 33.36 kg/m  Gen:   Awake, no distress, appears uncomfortable Resp:  Normal effort  MSK:   Moves extremities without difficulty  Other:    Medical Decision Making  Medically screening exam initiated at 1:04 PM.  Appropriate orders placed.  [facility] was informed that the remainder of the evaluation will be completed by another provider, this initial triage assessment does not replace that evaluation, and the importance of remaining in the ED until their evaluation is complete.     Johnn Hai, PA-C 07/05/22 1306

## 2022-07-12 ENCOUNTER — Other Ambulatory Visit: Payer: BLUE CROSS/BLUE SHIELD

## 2022-07-14 ENCOUNTER — Other Ambulatory Visit: Payer: Self-pay | Admitting: Obstetrics

## 2022-07-14 ENCOUNTER — Ambulatory Visit
Admission: RE | Admit: 2022-07-14 | Discharge: 2022-07-14 | Disposition: A | Discharge status: Home / Self Care | Payer: BLUE CROSS/BLUE SHIELD | Source: Ambulatory Visit | Attending: Obstetrics | Admitting: Obstetrics

## 2022-07-14 DIAGNOSIS — Z3A09 9 weeks gestation of pregnancy: Secondary | ICD-10-CM | POA: Diagnosis not present

## 2022-07-14 DIAGNOSIS — Z3480 Encounter for supervision of other normal pregnancy, unspecified trimester: Secondary | ICD-10-CM

## 2022-07-14 DIAGNOSIS — Z369 Encounter for antenatal screening, unspecified: Secondary | ICD-10-CM | POA: Insufficient documentation

## 2022-07-14 DIAGNOSIS — Z3687 Encounter for antenatal screening for uncertain dates: Secondary | ICD-10-CM | POA: Insufficient documentation

## 2022-07-15 ENCOUNTER — Other Ambulatory Visit: Payer: BLUE CROSS/BLUE SHIELD

## 2022-07-15 DIAGNOSIS — Z369 Encounter for antenatal screening, unspecified: Secondary | ICD-10-CM

## 2022-07-15 DIAGNOSIS — Z3480 Encounter for supervision of other normal pregnancy, unspecified trimester: Secondary | ICD-10-CM

## 2022-07-16 LAB — CBC/D/PLT+RPR+RH+ABO+RUBIGG...
Antibody Screen: NEGATIVE
Basophils Absolute: 0 10*3/uL (ref 0.0–0.2)
Basos: 0 %
EOS (ABSOLUTE): 0.1 10*3/uL (ref 0.0–0.4)
Eos: 1 %
HCV Ab: NONREACTIVE
HIV Screen 4th Generation wRfx: NONREACTIVE
Hematocrit: 34.9 % (ref 34.0–46.6)
Hemoglobin: 12 g/dL (ref 11.1–15.9)
Hepatitis B Surface Ag: NEGATIVE
Immature Grans (Abs): 0 10*3/uL (ref 0.0–0.1)
Immature Granulocytes: 0 %
Lymphocytes Absolute: 2 10*3/uL (ref 0.7–3.1)
Lymphs: 22 %
MCH: 28.9 pg (ref 26.6–33.0)
MCHC: 34.4 g/dL (ref 31.5–35.7)
MCV: 84 fL (ref 79–97)
Monocytes Absolute: 0.6 10*3/uL (ref 0.1–0.9)
Monocytes: 6 %
Neutrophils Absolute: 6.4 10*3/uL (ref 1.4–7.0)
Neutrophils: 71 %
Platelets: 196 10*3/uL (ref 150–450)
RBC: 4.15 x10E6/uL (ref 3.77–5.28)
RDW: 13.9 % (ref 11.7–15.4)
RPR Ser Ql: NONREACTIVE
Rh Factor: POSITIVE
Rubella Antibodies, IGG: 1.21 index (ref >0.99)
Varicella zoster IgG: 438 index (ref >165)
WBC: 9 10*3/uL (ref 3.4–10.8)

## 2022-07-16 LAB — HCV INTERPRETATION

## 2022-07-19 LAB — MATERNIT 21 PLUS CORE, BLOOD
Fetal Fraction: 6
Result (T21): NEGATIVE
Trisomy 13 (Patau syndrome): NEGATIVE
Trisomy 18 (Edwards syndrome): NEGATIVE
Trisomy 21 (Down syndrome): NEGATIVE

## 2022-07-20 ENCOUNTER — Encounter: Payer: Self-pay | Admitting: Obstetrics

## 2022-07-20 ENCOUNTER — Ambulatory Visit (INDEPENDENT_AMBULATORY_CARE_PROVIDER_SITE_OTHER): Payer: BLUE CROSS/BLUE SHIELD | Admitting: Obstetrics

## 2022-07-20 VITALS — BP 121/77 | HR 105 | Wt 215.0 lb

## 2022-07-20 DIAGNOSIS — Z3481 Encounter for supervision of other normal pregnancy, first trimester: Secondary | ICD-10-CM

## 2022-07-20 DIAGNOSIS — Z3A10 10 weeks gestation of pregnancy: Secondary | ICD-10-CM | POA: Diagnosis not present

## 2022-07-20 DIAGNOSIS — Z3480 Encounter for supervision of other normal pregnancy, unspecified trimester: Secondary | ICD-10-CM

## 2022-07-20 DIAGNOSIS — Z369 Encounter for antenatal screening, unspecified: Secondary | ICD-10-CM

## 2022-07-20 LAB — POCT URINALYSIS DIPSTICK OB
Bilirubin, UA: NEGATIVE
Blood, UA: NEGATIVE
Glucose, UA: NEGATIVE
Ketones, UA: NEGATIVE
Leukocytes, UA: NEGATIVE
Nitrite, UA: NEGATIVE
POC,PROTEIN,UA: NEGATIVE
Spec Grav, UA: 1.02 (ref 1.010–1.025)
Urobilinogen, UA: 0.2 E.U./dL
pH, UA: 6 (ref 5.0–8.0)

## 2022-07-20 MED ORDER — PRENATE MINI 18-0.6-0.4-350 MG PO CAPS: 1.0000 | ORAL_CAPSULE | Freq: Every day | ORAL | 8 refills | Status: AC

## 2022-07-20 NOTE — Progress Notes (Signed)
NEW OB HISTORY AND PHYSICAL  SUBJECTIVE:       Beth Willis is a 27 y.o. G38P1001 female, Patient's last menstrual period was 05/09/2022 (exact date)., Estimated Date of Delivery: 02/13/23, [redacted]w[redacted]d, presents today for establishment of Prenatal Care. She reports nausea and vomiting that is improving.  Social history Partner/Relationship: FOB involved Living situation: lives with husband and son. Feels safe. 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 Occasional MJ. Denies EtOH.   Gynecologic History Patient's last menstrual period was 05/09/2022 (exact date). Normal Contraception: none Last Pap: 09/17/2020. Results were: normal  Obstetric History OB History  Gravida Para Term Preterm AB Living  2 1 1  0 0 1  SAB IAB Ectopic Multiple Live Births  0 0 0 0 1    # Outcome Date GA Lbr Len/2nd Weight Sex Delivery Anes PTL Lv  2 Current           1 Term 04/23/18 [redacted]w[redacted]d / 00:56 6 lb 15.5 oz (3.16 kg) M Vag-Forceps EPI  LIV    Past Medical History:  Diagnosis Date   ADD (attention deficit disorder)    Anemia    Anxiety and depression 05/10/2019   Asthma    WELL CONTROLLED   GERD (gastroesophageal reflux disease)    OCC TUMS PRN   Headache    MIGRAINES   History of substance abuse (HCC)     Past Surgical History:  Procedure Laterality Date   ENDOSCOPIC CONCHA BULLOSA RESECTION Bilateral 07/28/2021   Procedure: ENDOSCOPIC CONCHA BULLOSA RESECTION;  Surgeon: 07/30/2021, MD;  Location: [facility];  Service: ENT;  Laterality: Bilateral;   ETHMOIDECTOMY Bilateral 07/28/2021   Procedure: ANTERIOR ETHMOIDECTOMY;  Surgeon: 07/30/2021, MD;  Location: [facility];  Service: ENT;  Laterality: Bilateral;   FOOT SURGERY  10/2019   FOOT SURGERY  11/28/2020   IMAGE GUIDED SINUS SURGERY N/A 07/28/2021   Procedure: IMAGE GUIDED SINUS SURGERY;  Surgeon: 07/30/2021, MD;  Location: [facility];  Service: ENT;   Laterality: N/A;  need stryker disk placed disk on OR charge nurse desk 1-16 kp   MAXILLARY ANTROSTOMY Bilateral 07/28/2021   Procedure: ENDOSCOPIC MAXILLARY ANTROSTOMY WITH TISSUE REMOVAL;  Surgeon: 07/30/2021, MD;  Location: [facility];  Service: ENT;  Laterality: Bilateral;   MYRINGOTOMY WITH TUBE PLACEMENT Left 08/16/2018   Procedure: MYRINGOTOMY WITH TUBE PLACEMENT;  Surgeon: 08/18/2018, MD;  Location: [facility];  Service: ENT;  Laterality: Left;   TONSILLECTOMY  2014   Pt not sure if addenoids taken   TYMPANOPLASTY WITH GRAFT Right 08/16/2018   Procedure: TYMPANOMASTOIDECTOMY WITH POSSIBLE OSSICULAR GRAFT;  Surgeon: 08/18/2018, MD;  Location: [facility];  Service: ENT;  Laterality: Right;   WISDOM TOOTH EXTRACTION  2014    Current Outpatient Medications on File Prior to Visit  Medication Sig Dispense Refill   montelukast (SINGULAIR) 10 MG tablet Take 10 mg by mouth daily.     ofloxacin (OCUFLOX) 0.3 % ophthalmic solution Place 4 drops into both eyes 2 (two) times daily.     ondansetron (ZOFRAN-ODT) 4 MG disintegrating tablet Take 1 tablet (4 mg total) by mouth every 8 (eight) hours as needed for nausea or vomiting. 21 tablet 0   promethazine (PHENERGAN) 25 MG tablet Take 1 tablet (25 mg total) by mouth every 6 (six) hours as needed for nausea or vomiting. 120 tablet 3   Azelastine HCl 137 MCG/SPRAY SOLN SMARTSIG:1-2 Spray(s) Both Nares Twice Daily  busPIRone (BUSPAR) 10 MG tablet Take 1 tablet (10 mg total) by mouth daily. (Patient not taking: Reported on 07/05/2022) 90 tablet 2   Cholecalciferol (D3-1000) 25 MCG (1000 UT) tablet Take 2,000 Units by mouth daily. (Patient not taking: Reported on 02/08/2022)     cyclobenzaprine (FLEXERIL) 10 MG tablet Take 1 tablet (10 mg total) by mouth 3 (three) times daily as needed for muscle spasms. (Patient not taking: Reported on 07/05/2022) 90 tablet 1   Doxylamine-Pyridoxine 10-10 MG TBEC Take 1 tablet by mouth as needed.      fluticasone (FLONASE) 50 MCG/ACT nasal spray Place 2 sprays into both nostrils daily.     glycerin adult 2 g suppository Place 1 suppository rectally as needed for constipation. 12 suppository 0   lidocaine (XYLOCAINE) 5 % ointment Apply 1 Application topically as needed. 35.44 g 0   meloxicam (MOBIC) 15 MG tablet TAKE 1 TABLET BY MOUTH EVERY DAY (Patient not taking: Reported on 07/05/2022) 30 tablet 3   No current facility-administered medications on file prior to visit.    Allergies  Allergen Reactions   Dust Mite Extract Shortness Of Breath and Swelling   Bee Pollen Other (See Comments)   Penicillins Hives    Did it involve swelling of the face/tongue/throat, SOB, or low BP? Unknown Did it involve sudden or severe rash/hives, skin peeling, or any reaction on the inside of your mouth or nose? No Did you need to seek medical attention at a hospital or doctor's office? No When did it last happen?      Childhood allergy   Pollen Extract    Amoxicillin Hives    Did it involve swelling of the face/tongue/throat, SOB, or low BP? Unknown Did it involve sudden or severe rash/hives, skin peeling, or any reaction on the inside of your mouth or nose? No Did you need to seek medical attention at a hospital or doctor's office? No When did it last happen?      Childhood allergy If all above answers are "NO", may proceed with cephalosporin use.     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  Tobacco Use   Smoking status: Some Days    Types: E-cigarettes   Smokeless tobacco: Never  Vaping Use   Vaping Use: Former  Substance and Sexual Activity   Alcohol use: Not Currently    Comment: can't remember last use- only occ   Drug use: Not Currently    Types: Marijuana    Comment: has stopped completely    Sexual activity: Yes    Partners: Male    Birth control/protection: None    Comment: hx IUD- removed 3 yrs ago  Other Topics Concern   Not on file  Social History Narrative   Not on file   Social Determinants of Health   Financial Resource Strain: Medium Risk (07/05/2022)   Overall Financial Resource Strain (CARDIA)    Difficulty of Paying Living Expenses: Somewhat hard  Food Insecurity: Food Insecurity Present (07/05/2022)   Hunger Vital Sign    Worried About [place] in the Last Year: Sometimes true    Ran Out of Food in the Last Year: Sometimes true  Transportation Needs: No Transportation Needs (07/05/2022)   PRAPARE - Transportation    Lack of  Transportation (Medical): No    Lack of Transportation (Non-Medical): No  Physical Activity: Inactive (07/05/2022)   Exercise Vital Sign    Days of Exercise per Week: 0 days    Minutes of Exercise per Session: 0 min  Stress: No Stress Concern Present (07/05/2022)   [employer] of Occupational Health - Occupational Stress Questionnaire    Feeling of Stress : Not at all  Social Connections: Moderately Isolated (07/05/2022)   Social Connection and Isolation Panel [NHANES]    Frequency of Communication with Friends and Family: More than three times a week    Frequency of Social Gatherings with Friends and Family: Twice a week    Attends Religious Services: Never    Database administrator or Organizations: No    Attends Banker Meetings: Never    Marital Status: Married  Catering manager Violence: Not At Risk (07/05/2022)   Humiliation, Afraid, Rape, and Kick questionnaire    Fear of Current or Ex-Partner: No    Emotionally Abused: No    Physically Abused: No    Sexually Abused: No    Family History  Problem Relation Age of Onset   Hypertension Mother    Hypertension Father    Healthy Brother    Healthy Brother    Healthy Brother    Hypertension Maternal Grandmother    Diabetes Maternal Grandmother     Thyroid disease Maternal Grandmother    Hypertension Maternal Grandfather    Thyroid disease Maternal Grandfather    Hypertension Paternal Grandmother    Thyroid disease Paternal Grandmother    Hypertension Paternal Grandfather    Thyroid disease Paternal Grandfather    Hypertension Maternal Aunt    Hypertension Maternal Uncle    Hypertension Paternal Aunt    Diabetes Paternal Aunt    Hypertension Paternal Uncle     The following portions of the patient's history were reviewed and updated as appropriate: allergies, current medications, past OB history, past medical history, past surgical history, past family history, past social history, and problem list.  Constitutional: Denied constitutional symptoms, night sweats, recent illness, fatigue, fever, insomnia and weight loss.  Eyes: Denied eye symptoms, eye pain, photophobia, vision change and visual disturbance.  Ears/Nose/Throat/Neck: Denied ear, nose, throat or neck symptoms, hearing loss, nasal discharge, sinus congestion and sore throat.  Cardiovascular: Denied cardiovascular symptoms, arrhythmia, chest pain/pressure, edema, exercise intolerance, orthopnea and palpitations.  Respiratory: Denied pulmonary symptoms, asthma, pleuritic pain, productive sputum, cough, dyspnea and wheezing.  Gastrointestinal: Denied, gastro-esophageal reflux, melena, nausea and vomiting.  Genitourinary:  Denied genitourinary symptoms including symptomatic vaginal discharge, pelvic relaxation issues, and urinary complaints.  Musculoskeletal: Denied musculoskeletal symptoms, stiffness, swelling, muscle weakness and myalgia.  Dermatologic: Denied dermatology symptoms, rash and scar.  Neurologic: Denied neurology symptoms, dizziness, headache, neck pain and syncope.  Psychiatric: Denied psychiatric symptoms, anxiety and depression.  Endocrine: Denied endocrine symptoms including hot flashes and night sweats.     OBJECTIVE:  Initial Physical Exam (New  OB)  GENERAL APPEARANCE: alert, well appearing HEAD: normocephalic, atraumatic MOUTH: mucous membranes moist, pharynx normal without lesions THYROID: no thyromegaly or masses present BREASTS: no masses noted, no significant tenderness, no palpable axillary nodes, no skin changes LUNGS: clear to auscultation, no wheezes, rales or rhonchi, symmetric air entry HEART: regular rate and rhythm, no murmurs ABDOMEN: soft, nontender, nondistended, no abnormal masses, no epigastric pain, FHT heard (162 bpm) EXTREMITIES: no redness or tenderness in the calves or thighs SKIN: normal coloration and turgor, no rashes LYMPH NODES: no  adenopathy palpable NEUROLOGIC: alert, oriented, normal speech, no focal findings or movement disorder noted  PELVIC EXAM EXTERNAL GENITALIA: normal appearing vulva with no masses, tenderness or lesions UTERUS: gravid and consistent with 10 weeks OB EXAM PELVIMETRY: appears adequate  ASSESSMENT: Normal pregnancy [redacted]w[redacted]d  PLAN: Routine prenatal care. We discussed an overview of prenatal care and when to call. Reviewed diet, exercise, and weight gain recommendations in pregnancy. Discussed benefits of breastfeeding and lactation resources at [facility]. I reviewed labs and answered all questions.  See orders  Lloyd Huger, CNM

## 2022-07-21 ENCOUNTER — Other Ambulatory Visit (HOSPITAL_COMMUNITY): Admit: 2022-07-21 | Payer: BLUE CROSS/BLUE SHIELD

## 2022-07-21 LAB — URINALYSIS, ROUTINE W REFLEX MICROSCOPIC
Bilirubin, UA: NEGATIVE
Glucose, UA: NEGATIVE
Ketones, UA: NEGATIVE
Leukocytes,UA: NEGATIVE
Nitrite, UA: NEGATIVE
RBC, UA: NEGATIVE
Specific Gravity, UA: >=1.03 — AB (ref 1.005–1.030)
Urobilinogen, Ur: 1 mg/dL (ref 0.2–1.0)
pH, UA: 5.5 (ref 5.0–7.5)

## 2022-07-21 NOTE — Addendum Note (Signed)
Addended by: Landis Gandy on: 07/21/2022 11:28 AM   Modules accepted: Orders

## 2022-07-22 LAB — URINE CULTURE, OB REFLEX

## 2022-07-22 LAB — URINE CYTOLOGY ANCILLARY ONLY
Chlamydia: NEGATIVE
Comment: NEGATIVE
Comment: NORMAL
Neisseria Gonorrhea: NEGATIVE

## 2022-07-22 LAB — CULTURE, OB URINE

## 2022-07-23 LAB — MONITOR DRUG PROFILE 14(MW)
Amphetamine Scrn, Ur: NEGATIVE ng/mL
BARBITURATE SCREEN URINE: NEGATIVE ng/mL
BENZODIAZEPINE SCREEN, URINE: NEGATIVE ng/mL
Buprenorphine, Urine: NEGATIVE ng/mL
Cocaine (Metab) Scrn, Ur: NEGATIVE ng/mL
Creatinine(Crt), U: 239 mg/dL (ref 20.0–300.0)
Fentanyl, Urine: NEGATIVE pg/mL
Meperidine Screen, Urine: NEGATIVE ng/mL
Methadone Screen, Urine: NEGATIVE ng/mL
OXYCODONE+OXYMORPHONE UR QL SCN: NEGATIVE ng/mL
Opiate Scrn, Ur: NEGATIVE ng/mL
Ph of Urine: 5.5 (ref 4.5–8.9)
Phencyclidine Qn, Ur: NEGATIVE ng/mL
Propoxyphene Scrn, Ur: NEGATIVE ng/mL
SPECIFIC GRAVITY: 1.03
Tramadol Screen, Urine: NEGATIVE ng/mL

## 2022-07-23 LAB — CANNABINOID (GC/MS), URINE
Cannabinoid: POSITIVE — AB
Carboxy THC (GC/MS): >750 ng/mL

## 2022-07-23 LAB — NICOTINE SCREEN, URINE: Cotinine Ql Scrn, Ur: NEGATIVE ng/mL

## 2022-07-28 ENCOUNTER — Encounter: Payer: Self-pay | Admitting: Advanced Practice Midwife

## 2022-07-29 ENCOUNTER — Other Ambulatory Visit: Payer: Self-pay | Admitting: Advanced Practice Midwife

## 2022-07-29 DIAGNOSIS — R0789 Other chest pain: Secondary | ICD-10-CM

## 2022-07-29 DIAGNOSIS — R051 Acute cough: Secondary | ICD-10-CM

## 2022-07-29 MED ORDER — ALBUTEROL SULFATE HFA 108 (90 BASE) MCG/ACT IN AERS: 2.0000 | INHALATION_SPRAY | Freq: Four times a day (QID) | RESPIRATORY_TRACT | 2 refills | Status: AC | PRN

## 2022-07-29 NOTE — Progress Notes (Signed)
Rx albuterol inhaler sent for acute croup symptoms

## 2022-08-09 ENCOUNTER — Encounter: Payer: Self-pay | Admitting: Advanced Practice Midwife

## 2022-08-09 DIAGNOSIS — O219 Vomiting of pregnancy, unspecified: Secondary | ICD-10-CM

## 2022-08-10 MED ORDER — ONDANSETRON HCL 4 MG PO TABS: 4.0000 mg | ORAL_TABLET | Freq: Three times a day (TID) | ORAL | 0 refills | Status: DC | PRN

## 2022-08-17 ENCOUNTER — Ambulatory Visit (INDEPENDENT_AMBULATORY_CARE_PROVIDER_SITE_OTHER): Payer: BLUE CROSS/BLUE SHIELD | Admitting: Advanced Practice Midwife

## 2022-08-17 ENCOUNTER — Encounter: Payer: Self-pay | Admitting: Advanced Practice Midwife

## 2022-08-17 VITALS — BP 120/86 | HR 99 | Wt 216.0 lb

## 2022-08-17 DIAGNOSIS — Z3A14 14 weeks gestation of pregnancy: Secondary | ICD-10-CM

## 2022-08-17 DIAGNOSIS — Z3482 Encounter for supervision of other normal pregnancy, second trimester: Secondary | ICD-10-CM

## 2022-08-17 NOTE — Addendum Note (Signed)
Addended by: Landis Gandy on: 08/17/2022 05:04 PM   Modules accepted: Orders
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
## 2022-08-30 ENCOUNTER — Encounter: Payer: Self-pay | Admitting: Obstetrics & Gynecology

## 2022-09-14 ENCOUNTER — Ambulatory Visit (INDEPENDENT_AMBULATORY_CARE_PROVIDER_SITE_OTHER): Payer: BLUE CROSS/BLUE SHIELD | Admitting: Obstetrics & Gynecology

## 2022-09-14 ENCOUNTER — Encounter: Payer: Self-pay | Admitting: Obstetrics & Gynecology

## 2022-09-14 VITALS — BP 130/81 | HR 97 | Wt 218.0 lb

## 2022-09-14 DIAGNOSIS — M549 Dorsalgia, unspecified: Secondary | ICD-10-CM

## 2022-09-14 DIAGNOSIS — Z3A18 18 weeks gestation of pregnancy: Secondary | ICD-10-CM

## 2022-09-14 DIAGNOSIS — O99891 Other specified diseases and conditions complicating pregnancy: Secondary | ICD-10-CM

## 2022-09-14 DIAGNOSIS — Z3480 Encounter for supervision of other normal pregnancy, unspecified trimester: Secondary | ICD-10-CM

## 2022-09-14 DIAGNOSIS — O99210 Obesity complicating pregnancy, unspecified trimester: Secondary | ICD-10-CM

## 2022-09-14 MED ORDER — ASPIRIN 81 MG PO TBEC: 81.0000 mg | DELAYED_RELEASE_TABLET | Freq: Every day | ORAL | 2 refills | Status: DC

## 2022-09-14 NOTE — Progress Notes (Signed)
   PRENATAL VISIT NOTE  Subjective:  Beth Willis is a 27 y.o. G2P1001 at [redacted]w[redacted]d being seen today for transfer of prenatal care.  Was seen at [facility].   She is currently monitored for the following issues for this low-risk pregnancy and has ADD (attention deficit disorder); Back pain affecting pregnancy in second trimester; Anxiety and depression; and Supervision of other normal pregnancy, antepartum on their problem list.  Patient reports no complaints.  Contractions: Not present. Vag. Bleeding: None.  Movement: Absent. Denies leaking of fluid.   The following portions of the patient's history were reviewed and updated as appropriate: allergies, current medications, past family history, past medical history, past social history, past surgical history and problem list.   Objective:   Vitals:   09/14/22 1514  BP: 130/81  Pulse: 97  Weight: 218 lb (98.9 kg)    Fetal Status: Fetal Heart Rate (bpm): 150   Movement: Absent     General:  Alert, oriented and cooperative. Patient is in no acute distress.  Skin: Skin is warm and dry. No rash noted.   Cardiovascular: Normal heart rate noted  Respiratory: Normal respiratory effort, no problems with respiration noted  Abdomen: Soft, gravid, appropriate for gestational age.  Pain/Pressure: Present     Pelvic: Cervical exam deferred        Extremities: Normal range of motion.  Edema: None  Mental Status: Normal mood and affect. Normal behavior. Normal judgment and thought content.   Assessment and Plan:  Pregnancy: G2P1001 at [redacted]w[redacted]d 1. Obesity in pregnancy, antepartum Recommended TWG 11-20 lbs.   Aspirin started for preeclampsia prophylaxis.  Surveillance labs ordered. Anatomy scan to be scheduled. - [place] MFM OB DETAIL +14 WK; Future - Hemoglobin A1c - Comprehensive metabolic panel - aspirin EC 81 MG tablet; Take 1 tablet (81 mg total) by mouth daily. Start taking when you are [redacted] weeks pregnant for rest of pregnancy for prevention  of preeclampsia  Dispense: 300 tablet; Refill: 2  2. Back pain affecting pregnancy in second trimester Seeing Chiropractor, she will continue to do so.  3. [redacted] weeks gestation of pregnancy 4. Supervision of other normal pregnancy, antepartum Low risk NIPS.  AFP offered, she wants this.  Anatomy scan scheduled. - AFP, Serum, Open Spina Bifida - [place] MFM OB DETAIL +14 WK; Future The nature of  [facility] with multiple MDs and other Advanced Practice Providers was explained to patient; also emphasized that residents, students are part of our team.  Preterm labor symptoms and general obstetric precautions including but not limited to vaginal bleeding, contractions, leaking of fluid and fetal movement were reviewed in detail with the patient. Please refer to After Visit Summary for other counseling recommendations.   Return in about 4 weeks (around 10/12/2022) for OFFICE OB VISIT (MD or APP).  Future Appointments  Date Time Provider Horatio  10/12/2022  3:50 PM Elijah Michaelis, Sallyanne Havers, MD [facility]    Verita Schneiders, MD

## 2022-09-16 ENCOUNTER — Encounter: Payer: Self-pay | Admitting: Obstetrics & Gynecology

## 2022-09-16 DIAGNOSIS — F1290 Cannabis use, unspecified, uncomplicated: Secondary | ICD-10-CM | POA: Insufficient documentation

## 2022-09-16 DIAGNOSIS — O09299 Supervision of pregnancy with other poor reproductive or obstetric history, unspecified trimester: Secondary | ICD-10-CM | POA: Insufficient documentation

## 2022-09-16 LAB — AFP, SERUM, OPEN SPINA BIFIDA
AFP MoM: 1.16
AFP Value: 42.6 ng/mL
Gest. Age on Collection Date: 18.3 weeks
Maternal Age At EDD: 28.4 yr
OSBR Risk 1 IN: 7366
Test Results:: NEGATIVE
Weight: 218 [lb_av]

## 2022-09-16 LAB — COMPREHENSIVE METABOLIC PANEL
ALT: 13 IU/L (ref 0–32)
AST: 15 IU/L (ref 0–40)
Albumin/Globulin Ratio: 1.6 (ref 1.2–2.2)
Albumin: 3.9 g/dL — ABNORMAL LOW (ref 4.0–5.0)
Alkaline Phosphatase: 79 IU/L (ref 44–121)
BUN/Creatinine Ratio: 9 (ref 9–23)
BUN: 5 mg/dL — ABNORMAL LOW (ref 6–20)
Bilirubin Total: <0.2 mg/dL (ref 0.0–1.2)
CO2: 19 mmol/L — ABNORMAL LOW (ref 20–29)
Calcium: 9.3 mg/dL (ref 8.7–10.2)
Chloride: 102 mmol/L (ref 96–106)
Creatinine, Ser: 0.53 mg/dL — ABNORMAL LOW (ref 0.57–1.00)
Globulin, Total: 2.5 g/dL (ref 1.5–4.5)
Glucose: 92 mg/dL (ref 70–99)
Potassium: 4 mmol/L (ref 3.5–5.2)
Sodium: 137 mmol/L (ref 134–144)
Total Protein: 6.4 g/dL (ref 6.0–8.5)
eGFR: 129 mL/min/{1.73_m2} (ref >59)

## 2022-09-16 LAB — HEMOGLOBIN A1C
Est. average glucose Bld gHb Est-mCnc: 105 mg/dL
Hgb A1c MFr Bld: 5.3 % (ref 4.8–5.6)

## 2022-09-27 ENCOUNTER — Telehealth: Payer: Self-pay

## 2022-09-27 NOTE — Telephone Encounter (Signed)
Chart reviewed. Patient has transferred out of our practice. She is still seeing [facility] providers at [facility]. albuterol (VENTOLIN HFA) 108 (90 Base) MCG/ACT inhaler  was prescribed in February by Lorrin Goodell, CNM for an acute illness. Spoke with patient to discuss prior authorization request. Patient states she had BCBS when the prescription was originally prescribed. She now has Medicaid. She needs for continued acute symptoms due to allergies. She saw ENT last week and is restarting allergy meds that she believes will help her symptoms. Advised will process prior authorization.

## 2022-09-27 NOTE — Telephone Encounter (Addendum)
Faxed received from [place] requesting prior authorization for: albuterol (VENTOLIN HFA) 108 (90 Base) MCG/ACT inhaler

## 2022-09-28 ENCOUNTER — Encounter: Payer: Self-pay | Admitting: Obstetrics & Gynecology

## 2022-09-28 NOTE — Telephone Encounter (Addendum)
Prior Authorization for Albuterol approved. Patient aware.

## 2022-09-29 ENCOUNTER — Other Ambulatory Visit: Payer: Self-pay | Admitting: *Deleted

## 2022-09-29 DIAGNOSIS — O99891 Other specified diseases and conditions complicating pregnancy: Secondary | ICD-10-CM

## 2022-09-30 ENCOUNTER — Encounter: Payer: Self-pay | Admitting: Obstetrics & Gynecology

## 2022-10-12 ENCOUNTER — Encounter: Payer: BLUE CROSS/BLUE SHIELD | Admitting: Obstetrics & Gynecology

## 2022-10-13 ENCOUNTER — Other Ambulatory Visit: Payer: Self-pay

## 2022-10-13 ENCOUNTER — Ambulatory Visit: Payer: Medicaid Other | Attending: Obstetrics

## 2022-10-13 DIAGNOSIS — Z3A22 22 weeks gestation of pregnancy: Secondary | ICD-10-CM | POA: Insufficient documentation

## 2022-10-13 DIAGNOSIS — Z363 Encounter for antenatal screening for malformations: Secondary | ICD-10-CM | POA: Insufficient documentation

## 2022-10-13 DIAGNOSIS — E669 Obesity, unspecified: Secondary | ICD-10-CM

## 2022-10-13 DIAGNOSIS — O99212 Obesity complicating pregnancy, second trimester: Secondary | ICD-10-CM | POA: Diagnosis present

## 2022-10-13 DIAGNOSIS — Z3480 Encounter for supervision of other normal pregnancy, unspecified trimester: Secondary | ICD-10-CM

## 2022-10-13 DIAGNOSIS — Z3A18 18 weeks gestation of pregnancy: Secondary | ICD-10-CM

## 2022-10-13 DIAGNOSIS — O99210 Obesity complicating pregnancy, unspecified trimester: Secondary | ICD-10-CM

## 2022-10-15 ENCOUNTER — Ambulatory Visit (INDEPENDENT_AMBULATORY_CARE_PROVIDER_SITE_OTHER): Payer: Medicaid Other | Admitting: Family Medicine

## 2022-10-15 VITALS — BP 126/78 | HR 93 | Wt 222.0 lb

## 2022-10-15 DIAGNOSIS — O09299 Supervision of pregnancy with other poor reproductive or obstetric history, unspecified trimester: Secondary | ICD-10-CM

## 2022-10-15 DIAGNOSIS — Z3480 Encounter for supervision of other normal pregnancy, unspecified trimester: Secondary | ICD-10-CM

## 2022-10-15 DIAGNOSIS — F419 Anxiety disorder, unspecified: Secondary | ICD-10-CM

## 2022-10-15 DIAGNOSIS — Z3A23 23 weeks gestation of pregnancy: Secondary | ICD-10-CM

## 2022-10-15 DIAGNOSIS — F32A Depression, unspecified: Secondary | ICD-10-CM

## 2022-10-15 NOTE — Progress Notes (Signed)
CC: having a lot of vaginal pressure with walking, moving

## 2022-10-15 NOTE — Progress Notes (Unsigned)
   PRENATAL VISIT NOTE  Subjective:  Beth Willis is a 27 y.o. G2P1001 at [redacted]w[redacted]d being seen today for ongoing prenatal care.  She is currently monitored for the following issues for this {Blank single:19197::"high-risk","low-risk"} pregnancy and has ADD (attention deficit disorder); Anxiety and depression; Supervision of other normal pregnancy, antepartum; Marijuana use; and History of forceps delivery in prior pregnancy, currently pregnant on their problem list.  Patient reports {sx:14538}.  Contractions: Not present. Vag. Bleeding: None.  Movement: Present. Denies leaking of fluid.   The following portions of the patient's history were reviewed and updated as appropriate: allergies, current medications, past family history, past medical history, past social history, past surgical history and problem list.   Objective:   Vitals:   10/15/22 0941  BP: 126/78  Pulse: 93  Weight: 222 lb (100.7 kg)    Fetal Status: Fetal Heart Rate (bpm): 140   Movement: Present     General:  Alert, oriented and cooperative. Patient is in no acute distress.  Skin: Skin is warm and dry. No rash noted.   Cardiovascular: Normal heart rate noted  Respiratory: Normal respiratory effort, no problems with respiration noted  Abdomen: Soft, gravid, appropriate for gestational age.  Pain/Pressure: Present     Pelvic: Cervical exam deferred        Extremities: Normal range of motion.  Edema: None  Mental Status: Normal mood and affect. Normal behavior. Normal judgment and thought content.   Assessment and Plan:  Pregnancy: G2P1001 at [redacted]w[redacted]d 1. History of forceps delivery in prior pregnancy, currently pregnant ***  2. Supervision of other normal pregnancy, antepartum ***  3. Anxiety and depression ***    {Blank single:19197::"Term","Preterm"} labor symptoms and general obstetric precautions including but not limited to vaginal bleeding, contractions, leaking of fluid and fetal movement were reviewed  in detail with the patient. Please refer to After Visit Summary for other counseling recommendations.   No follow-ups on file.  Future Appointments  Date Time Provider Department Center  11/12/2022 11:15 AM Anyanwu, Jethro Bastos, MD [facility]  11/24/2022  1:00 PM [facility]  11/26/2022  9:30 AM [facility] LAB [facility]  11/26/2022  9:55 AM [place] Bing, MD [facility]  12/10/2022  9:35 AM Federico Flake, MD [facility]  12/24/2022  9:35 AM [place] Bing, MD [facility]    Federico Flake, MD

## 2022-10-21 ENCOUNTER — Telehealth: Payer: Self-pay | Admitting: Licensed Clinical Social Worker

## 2022-10-21 NOTE — Telephone Encounter (Signed)
-----   Message from Ellison Carwin sent at 10/21/2022 12:49 PM EDT ----- Regarding: mh referral Evening Ma'am. Hope all is well.  At your convenience, would you please check in with this member.  She reported that she is currently doing well mentally but has a history of anxiety and pp depression.  Member reported taking medications in the past but decided to stop taking "cold [place]" on her own.... Member reported that she is not interested in taking any medications now, during her current pregnancy.

## 2022-11-12 ENCOUNTER — Ambulatory Visit (INDEPENDENT_AMBULATORY_CARE_PROVIDER_SITE_OTHER): Payer: Medicaid Other | Admitting: Obstetrics & Gynecology

## 2022-11-12 VITALS — BP 115/82 | HR 99 | Wt 227.0 lb

## 2022-11-12 DIAGNOSIS — Z3480 Encounter for supervision of other normal pregnancy, unspecified trimester: Secondary | ICD-10-CM

## 2022-11-12 DIAGNOSIS — O99210 Obesity complicating pregnancy, unspecified trimester: Secondary | ICD-10-CM | POA: Insufficient documentation

## 2022-11-12 DIAGNOSIS — Z3A26 26 weeks gestation of pregnancy: Secondary | ICD-10-CM

## 2022-11-12 NOTE — Patient Instructions (Addendum)
Return to office for any scheduled appointments. Call the office or go to the MAU at [facility] if: You begin to have strong, frequent contractions Your water breaks.  Sometimes it is a big gush of fluid, sometimes it is just a trickle that keeps getting your underwear wet or running down your legs You have vaginal bleeding.  It is normal to have a small amount of spotting if your cervix was checked.  You do not feel your baby moving like normal.  If you do not, get something to eat and drink and lay down and focus on feeling your baby move.   If your baby is still not moving like normal, you should call the office or go to MAU. Any other obstetric concerns.  Oral Glucose Tolerance Test During Pregnancy Why am I having this test? The oral glucose tolerance test (GTT) is done to check how your body processes blood sugar (glucose). This is one of several tests used to diagnose diabetes that develops during pregnancy (gestational diabetes mellitus). Gestational diabetes is a short-term form of diabetes that some women develop while they are pregnant. It usually occurs during the second or third trimester of pregnancy and goes away after delivery. Testing, or screening, for gestational diabetes usually occurs around 28 of pregnancy. This test may also be needed earlier if: You have a history of gestational diabetes. There is a history of giving birth to very large babies or of losing pregnancies (having stillbirths). You have signs and symptoms of diabetes, such as: Changes in your eyesight. Tingling or numbness in your hands or feet. Changes in hunger, thirst, and urination, and these are not explained by your pregnancy. What is being tested? This test measures the amount of glucose in your blood at different times during a period of 2 hours. This shows how well your body can process glucose.  You will have three separate blood draws. What kind of sample is  taken?  Blood samples are required for this test. They are usually collected by inserting a needle into a blood vessel. How do I prepare for this test? For 3 days before your test, eat normally. Have plenty of carbohydrate-rich foods. You will be asked not to eat or drink anything other than water (to fast) starting 8-10 hours before the test. Tell a health care provider about: All medicines you are taking, including vitamins, herbs, eye drops, creams, and over-the-counter medicines. Any blood disorders you have. Any surgeries you have had. Any medical conditions you have. What happens during the test? First, your blood glucose will be measured. This is referred to as your fasting blood glucose because you fasted before the test. Then, you will drink a glucose solution that contains a certain amount of glucose. Your blood glucose will be measured again 1 and 2 hours after you drink the solution. This test takes about 2 hours to complete. You will need to stay at the testing location during this time. During the testing period: Do not eat or drink anything other than the glucose solution. Do not exercise. Do not use any products that contain nicotine or tobacco, such as cigarettes, e-cigarettes, and chewing tobacco. These can affect your test results. If you need help quitting, ask your health care provider. The testing procedure may vary among health care providers and hospitals. How are the results reported? Your results will be reported as milligrams of glucose per deciliter of blood (mg/dL) or millimoles per liter (mmol/L). There   is more than one source for screening and diagnosis reference values used to diagnose gestational diabetes. Your health care provider will compare your results to normal values that were established after testing a large group of people (reference values). Reference values may vary among labs and hospitals. For this test, reference values are: Fasting: 92 mg/dL 1  hour: 180 mg/dL  2 hour: 153 mg/dL   What do the results mean? Results below the reference values are considered normal. If one or more of your blood glucose levels are at or above the reference values, you will be diagnosed with gestational diabetes.  Talk with your health care provider about what your results mean. Questions to ask your health care provider Ask your health care provider, or the department that is doing the test: When will my results be ready? How will I get my results? What are my treatment options? What other tests do I need? What are my next steps? Summary The oral glucose tolerance test (GTT) is one of several tests used to diagnose diabetes that develops during pregnancy (gestational diabetes mellitus). Gestational diabetes is a short-term form of diabetes that some women develop while they are pregnant. You may also have this test if you have any symptoms or risk factors for this type of diabetes. Talk with your health care provider about what your results mean. This information is not intended to replace advice given to you by your health care provider. Make sure you discuss any questions you have with your health care provider.   TDaP Vaccine Pregnancy Get the Whooping Cough Vaccine While You Are Pregnant (CDC)  It is important for women to get the whooping cough vaccine in the third trimester of each pregnancy. Vaccines are the best way to prevent this disease. There are 2 different whooping cough vaccines. Both vaccines combine protection against whooping cough, tetanus and diphtheria, but they are for different age groups: Tdap: for everyone 11 years or older, including pregnant women  DTaP: for children 2 months through 6 years of age  You need the whooping cough vaccine during each of your pregnancies The recommended time to get the shot is during your 27th through 36th week of pregnancy, preferably during the earlier part of this time period. The Centers  for Disease Control and Prevention (CDC) recommends that pregnant women receive the whooping cough vaccine for adolescents and adults (called Tdap vaccine) during the third trimester of each pregnancy. The recommended time to get the shot is during your 27th through 36th week of pregnancy, preferably during the earlier part of this time period. This replaces the original recommendation that pregnant women get the vaccine only if they had not previously received it. The American College of Obstetricians and Gynecologists and the American College of Nurse-Midwives support this recommendation.  You should get the whooping cough vaccine while pregnant to pass protection to your baby frame support disabled and/or not supported in this browser  Learn why Laura decided to get the whooping cough vaccine in her 3rd trimester of pregnancy and how her baby girl was born with some protection against the disease. Also available on YouTube. After receiving the whooping cough vaccine, your body will create protective antibodies (proteins produced by the body to fight off diseases) and pass some of them to your baby before birth. These antibodies provide your baby some short-term protection against whooping cough in early life. These antibodies can also protect your baby from some of the more serious complications that come along   with whooping cough. Your protective antibodies are at their highest about 2 weeks after getting the vaccine, but it takes time to pass them to your baby. So the preferred time to get the whooping cough vaccine is early in your third trimester. The amount of whooping cough antibodies in your body decreases over time. That is why CDC recommends you get a whooping cough vaccine during each pregnancy. Doing so allows each of your babies to get the greatest number of protective antibodies from you. This means each of your babies will get the best protection possible against this disease.  Getting  the whooping cough vaccine while pregnant is better than getting the vaccine after you give birth Whooping cough vaccination during pregnancy is ideal so your baby will have short-term protection as soon as he is born. This early protection is important because your baby will not start getting his whooping cough vaccines until he is 2 months old. These first few months of life are when your baby is at greatest risk for catching whooping cough. This is also when he's at greatest risk for having severe, potentially life-threating complications from the infection. To avoid that gap in protection, it is best to get a whooping cough vaccine during pregnancy. You will then pass protection to your baby before he is born. To continue protecting your baby, he should get whooping cough vaccines starting at 2 months old. You may never have gotten the Tdap vaccine before and did not get it during this pregnancy. If so, you should make sure to get the vaccine immediately after you give birth, before leaving the hospital or birthing center. It will take about 2 weeks before your body develops protection (antibodies) in response to the vaccine. Once you have protection from the vaccine, you are less likely to give whooping cough to your newborn while caring for him. But remember, your baby will still be at risk for catching whooping cough from others. A recent study looked to see how effective Tdap was at preventing whooping cough in babies whose mothers got the vaccine while pregnant or in the hospital after giving birth. The study found that getting Tdap between 27 through 36 weeks of pregnancy is 85% more effective at preventing whooping cough in babies younger than 2 months old. Blood tests cannot tell if you need a whooping cough vaccine There are no blood tests that can tell you if you have enough antibodies in your body to protect yourself or your baby against whooping cough. Even if you have been sick with whooping  cough in the past or previously received the vaccine, you still should get the vaccine during each pregnancy. Breastfeeding may pass some protective antibodies onto your baby By breastfeeding, you may pass some antibodies you have made in response to the vaccine to your baby. When you get a whooping cough vaccine during your pregnancy, you will have antibodies in your breast milk that you can share with your baby as soon as your milk comes in. However, your baby will not get protective antibodies immediately if you wait to get the whooping cough vaccine until after delivering your baby. This is because it takes about 2 weeks for your body to create antibodies. Learn more about the health benefits of breastfeeding.  

## 2022-11-12 NOTE — Progress Notes (Signed)
   PRENATAL VISIT NOTE  Subjective:  Beth Willis is a 27 y.o. G2P1001 at [redacted]w[redacted]d being seen today for ongoing prenatal care.  She is currently monitored for the following issues for this low-risk pregnancy and has ADD (attention deficit disorder); Anxiety and depression; Supervision of other normal pregnancy, antepartum; Marijuana use; History of forceps delivery in prior pregnancy, currently pregnant; and Obesity in pregnancy, antepartum on their problem list.  Patient reports no complaints.  Contractions: Not present. Vag. Bleeding: None.  Movement: Present. Denies leaking of fluid.   The following portions of the patient's history were reviewed and updated as appropriate: allergies, current medications, past family history, past medical history, past social history, past surgical history and problem list.   Objective:   Vitals:   11/12/22 1117  BP: 115/82  Pulse: 99  Weight: 227 lb (103 kg)    Fetal Status: Fetal Heart Rate (bpm): 156   Movement: Present     General:  Alert, oriented and cooperative. Patient is in no acute distress.  Skin: Skin is warm and dry. No rash noted.   Cardiovascular: Normal heart rate noted  Respiratory: Normal respiratory effort, no problems with respiration noted  Abdomen: Soft, gravid, appropriate for gestational age.  Pain/Pressure: Present     Pelvic: Cervical exam deferred        Extremities: Normal range of motion.  Edema: None  Mental Status: Normal mood and affect. Normal behavior. Normal judgment and thought content.   Assessment and Plan:  Pregnancy: G2P1001 at [redacted]w[redacted]d 1. Obesity in pregnancy, antepartum Scheduled for growth scan on 11/24/22.   TWG 12 lbs, doing well.  2. [redacted] weeks gestation of pregnancy 3. Supervision of other normal pregnancy, antepartum No other concerns.  Third trimester labs next visit.  Preterm labor symptoms and general obstetric precautions including but not limited to vaginal bleeding, contractions, leaking of  fluid and fetal movement were reviewed in detail with the patient. Please refer to After Visit Summary for other counseling recommendations.   Return in about 2 weeks (around 11/26/2022) for OFFICE OB VISIT (MD only).  Future Appointments  Date Time Provider Department Center  11/24/2022  1:00 PM [facility]  11/26/2022  9:30 AM [facility]  11/26/2022  9:55 AM U[place] Bing, MD [facility]  12/10/2022  9:35 AM Federico Flake, MD [facility]  12/23/2022  4:10 PM Mauri Tolen, Jethro Bastos, MD [facility]  01/06/2023  9:35 AM Marquist Binstock, Jethro Bastos, MD [facility]  01/20/2023  8:35 AM Rashada Klontz, Jethro Bastos, MD [facility]    Jaynie Collins, MD

## 2022-11-17 ENCOUNTER — Other Ambulatory Visit: Payer: Self-pay

## 2022-11-17 DIAGNOSIS — F419 Anxiety disorder, unspecified: Secondary | ICD-10-CM

## 2022-11-17 DIAGNOSIS — O09299 Supervision of pregnancy with other poor reproductive or obstetric history, unspecified trimester: Secondary | ICD-10-CM

## 2022-11-17 DIAGNOSIS — Z3480 Encounter for supervision of other normal pregnancy, unspecified trimester: Secondary | ICD-10-CM

## 2022-11-17 DIAGNOSIS — F1290 Cannabis use, unspecified, uncomplicated: Secondary | ICD-10-CM

## 2022-11-24 ENCOUNTER — Other Ambulatory Visit: Payer: Self-pay

## 2022-11-24 ENCOUNTER — Ambulatory Visit: Payer: Medicaid Other | Attending: Obstetrics and Gynecology

## 2022-11-24 VITALS — BP 120/86 | HR 105 | Temp 97.9°F | Ht 66.0 in | Wt 226.5 lb

## 2022-11-24 DIAGNOSIS — Z3A28 28 weeks gestation of pregnancy: Secondary | ICD-10-CM | POA: Diagnosis not present

## 2022-11-24 DIAGNOSIS — O99213 Obesity complicating pregnancy, third trimester: Secondary | ICD-10-CM | POA: Diagnosis present

## 2022-11-24 DIAGNOSIS — E669 Obesity, unspecified: Secondary | ICD-10-CM

## 2022-11-24 DIAGNOSIS — F419 Anxiety disorder, unspecified: Secondary | ICD-10-CM

## 2022-11-24 DIAGNOSIS — O09299 Supervision of pregnancy with other poor reproductive or obstetric history, unspecified trimester: Secondary | ICD-10-CM

## 2022-11-24 DIAGNOSIS — F1290 Cannabis use, unspecified, uncomplicated: Secondary | ICD-10-CM

## 2022-11-24 DIAGNOSIS — Z362 Encounter for other antenatal screening follow-up: Secondary | ICD-10-CM | POA: Insufficient documentation

## 2022-11-26 ENCOUNTER — Other Ambulatory Visit: Payer: Medicaid Other

## 2022-11-26 ENCOUNTER — Ambulatory Visit (INDEPENDENT_AMBULATORY_CARE_PROVIDER_SITE_OTHER): Payer: Medicaid Other | Admitting: Obstetrics and Gynecology

## 2022-11-26 VITALS — BP 119/81 | HR 111 | Wt 227.0 lb

## 2022-11-26 DIAGNOSIS — O99213 Obesity complicating pregnancy, third trimester: Secondary | ICD-10-CM

## 2022-11-26 DIAGNOSIS — Z3480 Encounter for supervision of other normal pregnancy, unspecified trimester: Secondary | ICD-10-CM

## 2022-11-26 DIAGNOSIS — Z23 Encounter for immunization: Secondary | ICD-10-CM

## 2022-11-26 DIAGNOSIS — O99210 Obesity complicating pregnancy, unspecified trimester: Secondary | ICD-10-CM

## 2022-11-26 DIAGNOSIS — Z6836 Body mass index (BMI) 36.0-36.9, adult: Secondary | ICD-10-CM

## 2022-11-26 NOTE — Progress Notes (Signed)
   PRENATAL VISIT NOTE  Subjective:  Beth Willis is a 27 y.o. G2P1001 at [redacted]w[redacted]d being seen today for ongoing prenatal care.  She is currently monitored for the following issues for this low-risk pregnancy and has ADD (attention deficit disorder); Anxiety and depression; Supervision of other normal pregnancy, antepartum; Marijuana use; History of forceps delivery in prior pregnancy, currently pregnant; and Obesity in pregnancy, antepartum on their problem list.  Patient reports no complaints.  Contractions: Not present. Vag. Bleeding: None.  Movement: Present. Denies leaking of fluid.   The following portions of the patient's history were reviewed and updated as appropriate: allergies, current medications, past family history, past medical history, past social history, past surgical history and problem list.   Objective:   Vitals:   11/26/22 0947  BP: 119/81  Pulse: (!) 111  Weight: 227 lb (103 kg)    Fetal Status: Fetal Heart Rate (bpm): 138   Movement: Present     General:  Alert, oriented and cooperative. Patient is in no acute distress.  Skin: Skin is warm and dry. No rash noted.   Cardiovascular: Normal heart rate noted  Respiratory: Normal respiratory effort, no problems with respiration noted  Abdomen: Soft, gravid, appropriate for gestational age.  Pain/Pressure: Present     Pelvic: Cervical exam deferred        Extremities: Normal range of motion.  Edema: None  Mental Status: Normal mood and affect. Normal behavior. Normal judgment and thought content.   Assessment and Plan:  Pregnancy: G2P1001 at [redacted]w[redacted]d 1. Need for Tdap vaccination - Tdap vaccine greater than or equal to 7yo IM  2. Supervision of other normal pregnancy, antepartum 28wk labs today. 5/29 84%, 1441gm, ac 85%, afi 19>>repeat PRN  3. BMI 36.0-36.9,adult Weight good, 12lbs total weight gain for pregnancy thus far.   4. Obesity in pregnancy  Preterm labor symptoms and general obstetric precautions  including but not limited to vaginal bleeding, contractions, leaking of fluid and fetal movement were reviewed in detail with the patient. Please refer to After Visit Summary for other counseling recommendations.   Return in about 2 weeks (around 12/10/2022) for low risk ob, in person or virtual, md or app.  Future Appointments  Date Time Provider Department Center  12/10/2022  9:35 AM Federico Flake, MD [facility]  12/23/2022  4:10 PM Anyanwu, Jethro Bastos, MD [facility]  01/06/2023  9:35 AM Macon Large, Jethro Bastos, MD [facility]  01/20/2023  8:35 AM Anyanwu, Jethro Bastos, MD [facility]    McLean Bing, MD

## 2022-11-26 NOTE — Progress Notes (Signed)
ROB   2 hr GTT today.  TDAP Offered:    Wants to discuss U/S from 11/24/22

## 2022-11-27 LAB — CBC
Hematocrit: 31.6 % — ABNORMAL LOW (ref 34.0–46.6)
Hemoglobin: 10.6 g/dL — ABNORMAL LOW (ref 11.1–15.9)
MCH: 28.4 pg (ref 26.6–33.0)
MCHC: 33.5 g/dL (ref 31.5–35.7)
MCV: 85 fL (ref 79–97)
Platelets: 181 10*3/uL (ref 150–450)
RBC: 3.73 x10E6/uL — ABNORMAL LOW (ref 3.77–5.28)
RDW: 13.7 % (ref 11.7–15.4)
WBC: 10.4 10*3/uL (ref 3.4–10.8)

## 2022-11-27 LAB — GLUCOSE TOLERANCE, 2 HOURS W/ 1HR
Glucose, 1 hour: 180 mg/dL — ABNORMAL HIGH (ref 70–179)
Glucose, 2 hour: 108 mg/dL (ref 70–152)
Glucose, Fasting: 88 mg/dL (ref 70–91)

## 2022-11-27 LAB — HIV ANTIBODY (ROUTINE TESTING W REFLEX): HIV Screen 4th Generation wRfx: NONREACTIVE

## 2022-11-27 LAB — RPR: RPR Ser Ql: NONREACTIVE

## 2022-11-29 ENCOUNTER — Encounter: Payer: Self-pay | Admitting: Obstetrics and Gynecology

## 2022-11-29 ENCOUNTER — Telehealth: Payer: Self-pay

## 2022-11-29 ENCOUNTER — Other Ambulatory Visit: Payer: Self-pay

## 2022-11-29 ENCOUNTER — Other Ambulatory Visit: Payer: Self-pay | Admitting: Obstetrics and Gynecology

## 2022-11-29 DIAGNOSIS — O24419 Gestational diabetes mellitus in pregnancy, unspecified control: Secondary | ICD-10-CM | POA: Insufficient documentation

## 2022-11-29 HISTORY — DX: Gestational diabetes mellitus in pregnancy, unspecified control: O24.419

## 2022-11-29 MED ORDER — ACCU-CHEK GUIDE VI STRP: ORAL_STRIP | 12 refills | Status: DC

## 2022-11-29 MED ORDER — ACCU-CHEK SOFTCLIX LANCETS MISC: 1.0000 | Freq: Four times a day (QID) | 12 refills | Status: DC

## 2022-11-29 MED ORDER — FERROUS SULFATE 324 MG PO TBEC: 324.0000 mg | DELAYED_RELEASE_TABLET | ORAL | 0 refills | Status: DC

## 2022-11-29 MED ORDER — ACCU-CHEK GUIDE W/DEVICE KIT: 1.0000 | PACK | Freq: Four times a day (QID) | 0 refills | Status: DC

## 2022-11-29 NOTE — Telephone Encounter (Signed)
Pt notified of results GDM referral placed and supplies sent. Pt voiced understanding had concerns on RBC and mentioned in last pregnancy GTT results were close as well and was allowed to monitor (kept a eye on it) pt advised for now treat a GDM as advised and follow through with referral and discuss at nv.

## 2022-11-29 NOTE — Telephone Encounter (Signed)
-----   Message from Wellington Bing, MD sent at 11/29/2022  8:18 AM EDT ----- She has GDM. Please set her up with the usual. thanks

## 2022-11-29 NOTE — Progress Notes (Signed)
GDM referral placed and supplies sent.   

## 2022-12-10 ENCOUNTER — Ambulatory Visit (INDEPENDENT_AMBULATORY_CARE_PROVIDER_SITE_OTHER): Payer: Medicaid Other | Admitting: Family Medicine

## 2022-12-10 VITALS — BP 111/79 | HR 92 | Wt 225.0 lb

## 2022-12-10 DIAGNOSIS — Z3480 Encounter for supervision of other normal pregnancy, unspecified trimester: Secondary | ICD-10-CM

## 2022-12-10 DIAGNOSIS — O24410 Gestational diabetes mellitus in pregnancy, diet controlled: Secondary | ICD-10-CM

## 2022-12-10 DIAGNOSIS — O99210 Obesity complicating pregnancy, unspecified trimester: Secondary | ICD-10-CM

## 2022-12-10 NOTE — Progress Notes (Signed)
ROB   Pt has blood sugar log was recently on vacation on [place] was not able to check sugars as much.

## 2022-12-10 NOTE — Progress Notes (Signed)
   PRENATAL VISIT NOTE  Subjective:  Beth Willis is a 27 y.o. G2P1001 at [redacted]w[redacted]d being seen today for ongoing prenatal care.  She is currently monitored for the following issues for this high-risk pregnancy and has ADD (attention deficit disorder); Anxiety and depression; Supervision of other normal pregnancy, antepartum; Marijuana use; History of forceps delivery in prior pregnancy, currently pregnant; Obesity in pregnancy, antepartum; and GDM (gestational diabetes mellitus) on their problem list.  Patient reports no complaints.  Contractions: Irritability. Vag. Bleeding: None.  Movement: Present. Denies leaking of fluid.   The following portions of the patient's history were reviewed and updated as appropriate: allergies, current medications, past family history, past medical history, past social history, past surgical history and problem list.   Objective:   Vitals:   12/10/22 0946  BP: 111/79  Pulse: 92  Weight: 225 lb (102.1 kg)    Fetal Status: Fetal Heart Rate (bpm): 142 Fundal Height: 32 cm Movement: Present     General:  Alert, oriented and cooperative. Patient is in no acute distress.  Skin: Skin is warm and dry. No rash noted.   Cardiovascular: Normal heart rate noted  Respiratory: Normal respiratory effort, no problems with respiration noted  Abdomen: Soft, gravid, appropriate for gestational age.  Pain/Pressure: Present     Pelvic: Cervical exam deferred        Extremities: Normal range of motion.  Edema: None  Mental Status: Normal mood and affect. Normal behavior. Normal judgment and thought content.   Assessment and Plan:  Pregnancy: G2P1001 at [redacted]w[redacted]d 1. Diet controlled gestational diabetes mellitus (GDM) in third trimester Fastings are 86-98 (only 1 above 95) PP 98-155 (had two values >120) Is logging her food and realized food that worsen her BG She was at the beach last week and was not checking sugars She agreed to send [place] a MyChart with BG log next week  with consistent checking of values.  - [place] MFM OB FOLLOW UP; Future  2. Supervision of other normal pregnancy, antepartum FH is WNL but slightly greater than expected Otherwise UTD Feels good movement - [place] MFM OB FOLLOW UP; Future  3. Obesity in pregnancy, antepartum TWG=10 lb (4.536 kg) which is within goal - [place] MFM OB FOLLOW UP; Future  Preterm labor symptoms and general obstetric precautions including but not limited to vaginal bleeding, contractions, leaking of fluid and fetal movement were reviewed in detail with the patient. Please refer to After Visit Summary for other counseling recommendations.   Return in about 2 weeks (around 12/24/2022) for Routine prenatal care.  Future Appointments  Date Time Provider Department Center  12/23/2022  4:10 PM Tereso Newcomer, MD [facility]  01/06/2023  9:35 AM Macon Large, Jethro Bastos, MD [facility]  01/17/2023  4:00 PM [facility]  01/20/2023  9:35 AM Anyanwu, Jethro Bastos, MD [facility]  02/03/2023  9:35 AM Empire Bing, MD [facility]  02/10/2023  9:35 AM Sharon Bing, MD [facility]  02/17/2023  9:35 AM Reva Bores, MD [facility]  02/24/2023  9:35 AM Anyanwu, Jethro Bastos, MD [facility]    Federico Flake, MD

## 2022-12-12 ENCOUNTER — Encounter: Payer: Self-pay | Admitting: Obstetrics and Gynecology

## 2022-12-12 ENCOUNTER — Observation Stay
Admission: EM | Admit: 2022-12-12 | Discharge: 2022-12-12 | Disposition: A | Discharge status: Home / Self Care | Payer: Medicaid Other | Attending: Obstetrics and Gynecology | Admitting: Obstetrics and Gynecology

## 2022-12-12 ENCOUNTER — Other Ambulatory Visit: Payer: Self-pay

## 2022-12-12 DIAGNOSIS — Z79899 Other long term (current) drug therapy: Secondary | ICD-10-CM | POA: Insufficient documentation

## 2022-12-12 DIAGNOSIS — Z7982 Long term (current) use of aspirin: Secondary | ICD-10-CM | POA: Insufficient documentation

## 2022-12-12 DIAGNOSIS — O26893 Other specified pregnancy related conditions, third trimester: Secondary | ICD-10-CM

## 2022-12-12 DIAGNOSIS — O4703 False labor before 37 completed weeks of gestation, third trimester: Principal | ICD-10-CM | POA: Insufficient documentation

## 2022-12-12 DIAGNOSIS — R109 Unspecified abdominal pain: Secondary | ICD-10-CM

## 2022-12-12 DIAGNOSIS — J45909 Unspecified asthma, uncomplicated: Secondary | ICD-10-CM | POA: Insufficient documentation

## 2022-12-12 DIAGNOSIS — O99333 Smoking (tobacco) complicating pregnancy, third trimester: Secondary | ICD-10-CM | POA: Insufficient documentation

## 2022-12-12 DIAGNOSIS — O99513 Diseases of the respiratory system complicating pregnancy, third trimester: Secondary | ICD-10-CM | POA: Insufficient documentation

## 2022-12-12 DIAGNOSIS — Z3A31 31 weeks gestation of pregnancy: Secondary | ICD-10-CM | POA: Insufficient documentation

## 2022-12-12 DIAGNOSIS — Z3480 Encounter for supervision of other normal pregnancy, unspecified trimester: Secondary | ICD-10-CM

## 2022-12-12 DIAGNOSIS — F17210 Nicotine dependence, cigarettes, uncomplicated: Secondary | ICD-10-CM | POA: Diagnosis not present

## 2022-12-12 LAB — URINALYSIS, COMPLETE (UACMP) WITH MICROSCOPIC
Bilirubin Urine: NEGATIVE
Glucose, UA: NEGATIVE mg/dL
Hgb urine dipstick: NEGATIVE
Ketones, ur: NEGATIVE mg/dL
Nitrite: NEGATIVE
Protein, ur: NEGATIVE mg/dL
Specific Gravity, Urine: 1.02 (ref 1.005–1.030)
pH: 5 (ref 5.0–8.0)

## 2022-12-12 LAB — COMPREHENSIVE METABOLIC PANEL
ALT: 10 U/L (ref 0–44)
AST: 19 U/L (ref 15–41)
Albumin: 2.9 g/dL — ABNORMAL LOW (ref 3.5–5.0)
Alkaline Phosphatase: 77 U/L (ref 38–126)
Anion gap: 9 (ref 5–15)
BUN: 7 mg/dL (ref 6–20)
CO2: 19 mmol/L — ABNORMAL LOW (ref 22–32)
Calcium: 8.4 mg/dL — ABNORMAL LOW (ref 8.9–10.3)
Chloride: 107 mmol/L (ref 98–111)
Creatinine, Ser: 0.6 mg/dL (ref 0.44–1.00)
GFR, Estimated: >60 mL/min (ref >60)
Glucose, Bld: 111 mg/dL — ABNORMAL HIGH (ref 70–99)
Potassium: 3.5 mmol/L (ref 3.5–5.1)
Sodium: 135 mmol/L (ref 135–145)
Total Bilirubin: 0.3 mg/dL (ref 0.3–1.2)
Total Protein: 6.1 g/dL — ABNORMAL LOW (ref 6.5–8.1)

## 2022-12-12 LAB — WET PREP, GENITAL
Clue Cells Wet Prep HPF POC: NONE SEEN
Sperm: NONE SEEN
Trich, Wet Prep: NONE SEEN
WBC, Wet Prep HPF POC: >=10 — AB (ref <10)
Yeast Wet Prep HPF POC: NONE SEEN

## 2022-12-12 LAB — CBC
HCT: 32.9 % — ABNORMAL LOW (ref 36.0–46.0)
Hemoglobin: 11 g/dL — ABNORMAL LOW (ref 12.0–15.0)
MCH: 28.7 pg (ref 26.0–34.0)
MCHC: 33.4 g/dL (ref 30.0–36.0)
MCV: 85.9 fL (ref 80.0–100.0)
Platelets: 143 10*3/uL — ABNORMAL LOW (ref 150–400)
RBC: 3.83 MIL/uL — ABNORMAL LOW (ref 3.87–5.11)
RDW: 14.2 % (ref 11.5–15.5)
WBC: 8.7 10*3/uL (ref 4.0–10.5)
nRBC: 0 % (ref 0.0–0.2)

## 2022-12-12 LAB — CHLAMYDIA/NGC RT PCR (ARMC ONLY)
Chlamydia Tr: NOT DETECTED
N gonorrhoeae: NOT DETECTED

## 2022-12-12 LAB — FETAL FIBRONECTIN: Fetal Fibronectin: NEGATIVE

## 2022-12-12 MED ORDER — LACTATED RINGERS IV BOLUS
1000.0000 mL | Freq: Once | INTRAVENOUS | Status: AC
  Administered 2022-12-12: 1000 mL via INTRAVENOUS

## 2022-12-12 NOTE — Progress Notes (Signed)
Discharge home. Left floor ambulatory by herself. Beth Willis S  
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
## 2022-12-13 LAB — URINE CULTURE

## 2022-12-22 ENCOUNTER — Ambulatory Visit: Payer: Medicaid Other

## 2022-12-23 ENCOUNTER — Ambulatory Visit (INDEPENDENT_AMBULATORY_CARE_PROVIDER_SITE_OTHER): Payer: Medicaid Other | Admitting: Obstetrics & Gynecology

## 2022-12-23 ENCOUNTER — Encounter: Payer: Medicaid Other | Admitting: Obstetrics & Gynecology

## 2022-12-23 ENCOUNTER — Encounter: Payer: Self-pay | Admitting: Obstetrics & Gynecology

## 2022-12-23 VITALS — BP 122/85 | HR 111 | Wt 227.0 lb

## 2022-12-23 DIAGNOSIS — O24410 Gestational diabetes mellitus in pregnancy, diet controlled: Secondary | ICD-10-CM

## 2022-12-23 DIAGNOSIS — Z3A32 32 weeks gestation of pregnancy: Secondary | ICD-10-CM

## 2022-12-23 DIAGNOSIS — O0993 Supervision of high risk pregnancy, unspecified, third trimester: Secondary | ICD-10-CM

## 2022-12-23 DIAGNOSIS — O99210 Obesity complicating pregnancy, unspecified trimester: Secondary | ICD-10-CM

## 2022-12-23 NOTE — Progress Notes (Signed)
   PRENATAL VISIT NOTE  Subjective:  Beth Willis is a 27 y.o. G2P1001 at [redacted]w[redacted]d being seen today for ongoing prenatal care.  She is currently monitored for the following issues for this high-risk pregnancy and has ADD (attention deficit disorder); Anxiety and depression; Supervision of high-risk pregnancy; Marijuana use; History of forceps delivery in prior pregnancy, currently pregnant; Obesity in pregnancy, antepartum; and GDM (gestational diabetes mellitus) on their problem list.  Patient reports no complaints.  Contractions: Irritability. Vag. Bleeding: None.  Movement: Present. Denies leaking of fluid.   The following portions of the patient's history were reviewed and updated as appropriate: allergies, current medications, past family history, past medical history, past social history, past surgical history and problem list.   Objective:   Vitals:   12/23/22 1415  BP: 122/85  Pulse: (!) 111  Weight: 227 lb (103 kg)    Fetal Status: Fetal Heart Rate (bpm): 154   Movement: Present     General:  Alert, oriented and cooperative. Patient is in no acute distress.  Skin: Skin is warm and dry. No rash noted.   Cardiovascular: Normal heart rate noted  Respiratory: Normal respiratory effort, no problems with respiration noted  Abdomen: Soft, gravid, appropriate for gestational age.  Pain/Pressure: Present     Pelvic: Cervical exam deferred        Extremities: Normal range of motion.  Edema: None  Mental Status: Normal mood and affect. Normal behavior. Normal judgment and thought content.   Assessment and Plan:  Pregnancy: G2P1001 at [redacted]w[redacted]d 1. Obesity in pregnancy, antepartum TWG 12 lbs, doing well  2. Diet controlled gestational diabetes mellitus (GDM) in third trimester Blood sugars reviewed, within range. Continue diet and exercise. Growth scan scheduled 01/17/23.  3. [redacted] weeks gestation of pregnancy 4. Supervision of high risk pregnancy in third trimester Preterm labor  symptoms and general obstetric precautions including but not limited to vaginal bleeding, contractions, leaking of fluid and fetal movement were reviewed in detail with the patient. Please refer to After Visit Summary for other counseling recommendations.   Return in about 2 weeks (around 01/06/2023) for OFFICE OB VISIT (MD only, can be virtual).  Future Appointments  Date Time Provider Department Center  01/06/2023  9:35 AM Joyel Chenette, Jethro Bastos, MD [facility]  01/17/2023  4:00 PM [facility]  01/20/2023  9:35 AM Jacinda Kanady, Jethro Bastos, MD [facility]  02/03/2023  9:35 AM [place] Bing, MD [facility]  02/10/2023  9:35 AM [place] Bing, MD [facility]  02/17/2023  9:35 AM Reva Bores, MD [facility]  02/24/2023  9:35 AM Naleyah Ohlinger, Jethro Bastos, MD [facility]    Jaynie Collins, MD

## 2022-12-23 NOTE — Patient Instructions (Signed)
Return to office for any scheduled appointments. Call the office or go to the MAU at [facility] [place] if: You begin to have strong, frequent contractions Your water breaks.  Sometimes it is a big gush of fluid, sometimes it is just a trickle that keeps getting your underwear wet or running down your legs You have vaginal bleeding.  It is normal to have a small amount of spotting if your cervix was checked.  You do not feel your baby moving like normal.  If you do not, get something to eat and drink and lay down and focus on feeling your baby move.   If your baby is still not moving like normal, you should call the office or go to MAU. Any other obstetric concerns.  

## 2022-12-24 ENCOUNTER — Encounter: Payer: Medicaid Other | Admitting: Obstetrics and Gynecology

## 2023-01-03 ENCOUNTER — Encounter: Payer: Self-pay | Admitting: Obstetrics & Gynecology

## 2023-01-06 ENCOUNTER — Ambulatory Visit (INDEPENDENT_AMBULATORY_CARE_PROVIDER_SITE_OTHER): Payer: Medicaid Other | Admitting: Obstetrics & Gynecology

## 2023-01-06 ENCOUNTER — Encounter: Payer: Self-pay | Admitting: Obstetrics & Gynecology

## 2023-01-06 VITALS — BP 121/83 | HR 83 | Wt 225.0 lb

## 2023-01-06 DIAGNOSIS — O99210 Obesity complicating pregnancy, unspecified trimester: Secondary | ICD-10-CM

## 2023-01-06 DIAGNOSIS — O24410 Gestational diabetes mellitus in pregnancy, diet controlled: Secondary | ICD-10-CM

## 2023-01-06 DIAGNOSIS — Z3A34 34 weeks gestation of pregnancy: Secondary | ICD-10-CM

## 2023-01-06 DIAGNOSIS — O0993 Supervision of high risk pregnancy, unspecified, third trimester: Secondary | ICD-10-CM

## 2023-01-06 NOTE — Patient Instructions (Signed)
Return to office for any scheduled appointments. Call the office or go to the [facility] at [facility] at  if: You begin to have strong, frequent contractions Your water breaks.  Sometimes it is a big gush of fluid, sometimes it is just a trickle that keeps getting your underwear wet or running down your legs You have vaginal bleeding.  It is normal to have a small amount of spotting if your cervix was checked.  You do not feel your baby moving like normal.  If you do not, get something to eat and drink and lay down and focus on feeling your baby move.   If your baby is still not moving like normal, you should call the office or go to MAU. Any other obstetric concerns.  

## 2023-01-06 NOTE — Progress Notes (Signed)
PRENATAL VISIT NOTE  Subjective:  Beth Willis is a 27 y.o. G2P1001 at [redacted]w[redacted]d being seen today for ongoing prenatal care.  She is currently monitored for the following issues for this high-risk pregnancy and has ADD (attention deficit disorder); Anxiety and depression; Supervision of high-risk pregnancy; Marijuana use; History of forceps delivery in prior pregnancy, currently pregnant; Obesity in pregnancy, antepartum; and GDM (gestational diabetes mellitus) on their problem list.  Patient reports no complaints.  Contractions: Irritability. Vag. Bleeding: None.  Movement: Present. Denies leaking of fluid.   The following portions of the patient's history were reviewed and updated as appropriate: allergies, current medications, past family history, past medical history, past social history, past surgical history and problem list.   Objective:   Vitals:   01/06/23 0938  BP: 121/83  Pulse: 83  Weight: 225 lb (102.1 kg)    Fetal Status: Fetal Heart Rate (bpm): 138   Movement: Present     General:  Alert, oriented and cooperative. Patient is in no acute distress.  Skin: Skin is warm and dry. No rash noted.   Cardiovascular: Normal heart rate noted  Respiratory: Normal respiratory effort, no problems with respiration noted  Abdomen: Soft, gravid, appropriate for gestational age.  Pain/Pressure: Present     Pelvic: Cervical exam deferred Dilation: Closed Effacement (%): Thick Station: Ballotable  Extremities: Normal range of motion.  Edema: Trace  Mental Status: Normal mood and affect. Normal behavior. Normal judgment and thought content.    Imaging: [place] MFM OB FOLLOW UP  Result Date: 11/24/2022 ----------------------------------------------------------------------  OBSTETRICS REPORT                       (Signed Final 11/24/2022 02:02 pm) ---------------------------------------------------------------------- Patient Info  ID #:       [id number]                          D.O.B.:   1995-11-22 (27 yrs)  Name:       Beth Willis             Visit Date: 11/24/2022 01:21 pm ---------------------------------------------------------------------- Performed By  Attending:        Noralee Space MD        Ref. Address:     [address]                                                             [address]  Performed By:     Eden Lathe BS      [facility]                                 [facility] at  [facility]  Referred By:      [facility] Mila Merry ---------------------------------------------------------------------- Orders  #  Description                           Code        Ordered By  1  [place] MFM OB FOLLOW UP                   [phone]    Noralee Space ----------------------------------------------------------------------  #  Order #                     Accession #                Episode #  1  [id number]                   [id number]                 [id number] ---------------------------------------------------------------------- Indications  [redacted] weeks gestation of pregnancy                Z3A.28  Obesity complicating pregnancy, third          O99.213  trimester (pregravid BMI 33)  Encounter for other antenatal screening        Z36.2  follow-up  Low risk NIPS ---------------------------------------------------------------------- Fetal Evaluation  Num Of Fetuses:         1  Fetal Heart Rate(bpm):  158  Cardiac Activity:       Observed  Presentation:           Cephalic  Placenta:               Anterior  P. Cord Insertion:      Previously visualized  Amniotic Fluid  AFI FV:      Within normal limits  AFI Sum(cm)     %Tile       Largest Pocket(cm)  18.96           74          5.88  RUQ(cm)       RLQ(cm)       LUQ(cm)        LLQ(cm)  5.88          5.33          2.06           5.69 ---------------------------------------------------------------------- Biometry  BPD:     79.09  mm     G. Age:   31w 5d       > 99  %    CI:        75.62   %    70 - 86                                                          FL/HC:      18.4   %    18.8 - 20.6  HC:    288.37   mm     G. Age:  31w 5d         97  %    HC/AC:      1.12        1.05 - 1.21  AC:  258.1  mm     G. Age:  30w 0d         85  %    FL/BPD:     67.2   %    71 - 87  FL:      53.18  mm     G. Age:  28w 2d         29  %    FL/AC:      20.6   %    20 - 24  LV:        4.3  mm  Est. FW:    1441  gm      3 lb 3 oz     84  % ---------------------------------------------------------------------- OB History  Gravidity:    2         Term:   1        Prem:   0        SAB:   0  TOP:          0       Ectopic:  0        Living: 1 ---------------------------------------------------------------------- Gestational Age  LMP:           28w 3d        Date:  05/09/22                 EDD:   02/13/23  U/S Today:     30w 3d                                        EDD:   01/30/23  Best:          28w 3d     Det. By:  LMP  (05/09/22)          EDD:   02/13/23 ---------------------------------------------------------------------- Anatomy  Cranium:               Appears normal         Aortic Arch:            Previously seen  Cavum:                 Previously seen        Ductal Arch:            Appears normal  Ventricles:            Appears normal         Diaphragm:              Appears normal  Choroid Plexus:        Appears normal         Stomach:                Appears normal, left                                                                        sided  Cerebellum:            Previously seen        Abdomen:  Appears normal  Posterior Fossa:       Previously seen        Abdominal Wall:         Previously seen  Nuchal Fold:           Not applicable (>20    Cord Vessels:           Previously seen                         wks GA)  Face:                  Appears normal         Kidneys:                Appear normal                         (orbits and profile)  Lips:                   Previously seen        Bladder:                Appears normal  Thoracic:              Appears normal         Spine:                  Previously seen  Heart:                 Appears normal         Upper Extremities:      Previously seen                         (4CH, axis, and                         situs)  RVOT:                  Appears normal         Lower Extremities:      Previously seen  LVOT:                  Appears normal  Other:  3VTV visualized. Heels/feet and open hands/5th digits previously          visualized. Nasal bone, lenses, maxilla, mandible and falx previously          visualized VC and 3VV previously visualized. Fetus appears to be a          female. ---------------------------------------------------------------------- Cervix Uterus Adnexa  Cervix  Not visualized (advanced GA >24wks)  Uterus  No abnormality visualized.  Right Ovary  Not visualized.  Left Ovary  Not visualized.  Cul De Sac  No free fluid seen.  Adnexa  No abnormality visualized ---------------------------------------------------------------------- Impression  Fetal growth is appropriate for gestational age .Amniotic fluid  is normal and good fetal activity is seen .  Patient will be screening for gestational diabetes this week. ---------------------------------------------------------------------- Recommendations  Follow-up scans as clinically indicated. ----------------------------------------------------------------------                 Noralee Space, MD Electronically Signed Final Report   11/24/2022 02:02 pm ----------------------------------------------------------------------  [place] MFM OB DETAIL +14 WK  Result Date: 10/13/2022 ----------------------------------------------------------------------  OBSTETRICS REPORT                       (  Signed Final 10/13/2022 04:19 pm) ---------------------------------------------------------------------- Patient Info  ID #:       [id number]                          D.O.B.:   1996/02/23 (27 yrs)  Name:       JACQUELEEN PULVER Luten             Visit Date: 10/13/2022 03:11 pm ---------------------------------------------------------------------- Performed By  Attending:        Ma Rings MD         Ref. Address:     [address]                                                             [address]  Performed By:     Eden Lathe BS      [facility]                    [facility]                                 Fetal Care at                                                             [facility]  Referred By:      [facility] ---------------------------------------------------------------------- Orders  #  Description                           Code        Ordered By  1  [place] MFM OB DETAIL +14 WK               [id number]    Jaynie Collins ----------------------------------------------------------------------  #  Order #                     Accession #                Episode #  1  [id number]                   [id number]                 [id number] ---------------------------------------------------------------------- Indications  Obesity complicating pregnancy, second         O99.212  trimester (pregravid BMI 33)  [redacted] weeks gestation of pregnancy                Z3A.22  Antenatal screening for malformations          Z36.3  Low risk NIPS ---------------------------------------------------------------------- Fetal Evaluation  Num Of Fetuses:         1  Cardiac Activity:       Observed  Presentation:           Cephalic  Placenta:               Anterior  P. Cord Insertion:      Visualized  Amniotic Fluid  AFI FV:      Within normal limits                              Largest Pocket(cm)                              5.81 ---------------------------------------------------------------------- Biometry  BPD:     57.26  mm     G. Age:  23w 4d         84  %    CI:        71.23   %    70 - 86                                                          FL/HC:      18.1   %    18.4 - 20.2   HC:    216.11   mm     G. Age:  23w 5d         84  %    HC/AC:      1.17        1.06 - 1.25  AC:    185.19   mm     G. Age:  23w 2d         71  %    FL/BPD:     68.5   %    71 - 87  FL:      39.22  mm     G. Age:  22w 4d         45  %    FL/AC:      21.2   %    20 - 24  HUM:      38.7  mm     G. Age:  23w 5d         77  %  CER:      25.5  mm     G. Age:  23w 1d         89  %  LV:        5.1  mm  CM:        3.7  mm  Est. FW:     562  gm      1 lb 4 oz     77  % ---------------------------------------------------------------------- OB History  Gravidity:    2         Term:   1        Prem:   0        SAB:   0  TOP:          0       Ectopic:  0        Living: 1 ---------------------------------------------------------------------- Gestational Age  LMP:           22w 3d        Date:  05/09/22                 EDD:   02/13/23  U/S Today:     23w 2d  EDD:   02/07/23  Best:          Maudie Mercury 3d     Det. By:  LMP  (05/09/22)          EDD:   02/13/23 ---------------------------------------------------------------------- Anatomy  Cranium:               Appears normal         Aortic Arch:            Appears normal  Cavum:                 Appears normal         Ductal Arch:            Appears normal  Ventricles:            Appears normal         Diaphragm:              Appears normal  Choroid Plexus:        Appears normal         Stomach:                Appears normal, left                                                                        sided  Cerebellum:            Appears normal         Abdomen:                Appears normal  Posterior Fossa:       Appears normal         Abdominal Wall:         Appears nml (cord                                                                        insert, abd wall)  Nuchal Fold:           Not applicable (>20    Cord Vessels:           Appears normal ([redacted]                         wks GA)                                        vessel cord)  Face:                   Appears normal         Kidneys:                Appear normal                         (orbits and profile)  Lips:                  Appears normal         Bladder:                Appears normal  Thoracic:              Appears normal         Spine:                  Appears normal  Heart:                 Appears normal         Upper Extremities:      Appears normal                         (4CH, axis, and                         situs)  RVOT:                  Appears normal         Lower Extremities:      Appears normal  LVOT:                  Appears normal  Other:  Heels/feet and open hands/5th digits visualized. Nasal bone, lenses,          maxilla, mandible and falx visualized VC and 3VV visualized. Fetus          appears to be a female. ---------------------------------------------------------------------- Cervix Uterus Adnexa  Cervix  Length:            4.4  cm.  Normal appearance by transabdominal scan  Uterus  No abnormality visualized.  Right Ovary  Within normal limits.  Left Ovary  Within normal limits.  Cul De Sac  No free fluid seen.  Adnexa  No abnormality visualized ---------------------------------------------------------------------- Comments  This patient was seen for a detailed fetal anatomy scan due  to maternal obesity with a BMI of 33.  She denies any other significant past medical history and  denies any problems in her current pregnancy.  She had a cell free DNA test earlier in her pregnancy which  indicated a low risk for trisomy 8, 53, and 13. A female fetus is  predicted.  She was informed that the fetal growth and amniotic fluid  level were appropriate for her gestational age.  There were no obvious fetal anomalies noted on today's  ultrasound exam.  The patient was informed that anomalies may be missed due  to technical limitations. If the fetus is in a suboptimal position  or maternal habitus is increased, visualization of the fetus in  the maternal uterus may be impaired.  Due to maternal  obesity, follow-up growth scan was  scheduled at 28 weeks (in 6 weeks). ----------------------------------------------------------------------                   Ma Rings, MD Electronically Signed Final Report   10/13/2022 04:19 pm ----------------------------------------------------------------------   Assessment and Plan:  Pregnancy: G2P1001 at [redacted]w[redacted]d 1. Diet controlled gestational diabetes mellitus (GDM) in third trimester Did not bring log, was told to take picture of her log and send to [place]. She reports normal blood sugars. Continue diet and exercise for now.  Next growth scan is on 01/17/23.   2. Obesity in  pregnancy, antepartum TWG 10 lbs, doing well.  3. [redacted] weeks gestation of pregnancy 4. Supervision of high risk pregnancy in third trimester No other concerns.  Pelvic cultures next visit. Preterm labor symptoms and general obstetric precautions including but not limited to vaginal bleeding, contractions, leaking of fluid and fetal movement were reviewed in detail with the patient. Please refer to After Visit Summary for other counseling recommendations.   Return in about 2 weeks (around 01/20/2023) for Pelvic cultures, OFFICE OB VISIT (MD only).  Future Appointments  Date Time Provider Department Center  01/17/2023  4:00 PM [facility]  01/20/2023  9:35 AM Jhovany Weidinger, Jethro Bastos, MD [facility]  02/03/2023  9:35 AM [place] Bing, MD [facility]  02/10/2023  9:35 AM [place] Bing, MD [facility]  02/17/2023  9:35 AM Reva Bores, MD [facility]  02/24/2023  9:35 AM La[place] Maslow, Jethro Bastos, MD [facility]    Jaynie Collins, MD

## 2023-01-06 NOTE — Progress Notes (Signed)
ROB   Pt wants cervix check.  CC: Back pain , pain and pressure.

## 2023-01-07 ENCOUNTER — Encounter: Payer: Self-pay | Admitting: Obstetrics & Gynecology

## 2023-01-12 ENCOUNTER — Other Ambulatory Visit: Payer: Self-pay

## 2023-01-12 DIAGNOSIS — O99210 Obesity complicating pregnancy, unspecified trimester: Secondary | ICD-10-CM

## 2023-01-12 DIAGNOSIS — F909 Attention-deficit hyperactivity disorder, unspecified type: Secondary | ICD-10-CM

## 2023-01-12 DIAGNOSIS — O24410 Gestational diabetes mellitus in pregnancy, diet controlled: Secondary | ICD-10-CM

## 2023-01-12 DIAGNOSIS — F1290 Cannabis use, unspecified, uncomplicated: Secondary | ICD-10-CM

## 2023-01-12 DIAGNOSIS — F419 Anxiety disorder, unspecified: Secondary | ICD-10-CM

## 2023-01-14 ENCOUNTER — Encounter: Payer: Self-pay | Admitting: Obstetrics & Gynecology

## 2023-01-14 ENCOUNTER — Encounter: Payer: Self-pay | Admitting: Obstetrics and Gynecology

## 2023-01-14 ENCOUNTER — Other Ambulatory Visit: Payer: Self-pay

## 2023-01-14 ENCOUNTER — Observation Stay
Admission: EM | Admit: 2023-01-14 | Discharge: 2023-01-14 | Disposition: A | Discharge status: Home / Self Care | Payer: Medicaid Other | Attending: Obstetrics and Gynecology | Admitting: Obstetrics and Gynecology

## 2023-01-14 DIAGNOSIS — O99333 Smoking (tobacco) complicating pregnancy, third trimester: Secondary | ICD-10-CM | POA: Diagnosis not present

## 2023-01-14 DIAGNOSIS — O479 False labor, unspecified: Secondary | ICD-10-CM | POA: Diagnosis present

## 2023-01-14 DIAGNOSIS — O0993 Supervision of high risk pregnancy, unspecified, third trimester: Principal | ICD-10-CM

## 2023-01-14 DIAGNOSIS — F17210 Nicotine dependence, cigarettes, uncomplicated: Secondary | ICD-10-CM | POA: Diagnosis not present

## 2023-01-14 DIAGNOSIS — Z7982 Long term (current) use of aspirin: Secondary | ICD-10-CM | POA: Diagnosis not present

## 2023-01-14 DIAGNOSIS — Z3A35 35 weeks gestation of pregnancy: Secondary | ICD-10-CM | POA: Diagnosis not present

## 2023-01-14 DIAGNOSIS — O4703 False labor before 37 completed weeks of gestation, third trimester: Secondary | ICD-10-CM | POA: Diagnosis present

## 2023-01-14 DIAGNOSIS — O26893 Other specified pregnancy related conditions, third trimester: Secondary | ICD-10-CM | POA: Diagnosis not present

## 2023-01-14 DIAGNOSIS — R109 Unspecified abdominal pain: Secondary | ICD-10-CM

## 2023-01-14 DIAGNOSIS — Z79899 Other long term (current) drug therapy: Secondary | ICD-10-CM | POA: Insufficient documentation

## 2023-01-14 LAB — RUPTURE OF MEMBRANE (ROM)PLUS: Rom Plus: NEGATIVE

## 2023-01-14 MED ORDER — LACTATED RINGERS IV BOLUS
1000.0000 mL | Freq: Once | INTRAVENOUS | Status: AC
  Administered 2023-01-14: 1000 mL via INTRAVENOUS

## 2023-01-14 NOTE — Progress Notes (Signed)
Obstetric H&P   Chief Complaint: contractions  Prenatal Care Provider: [facility]   History of Present Illness: 27 y.o. G2P1001 [redacted]w[redacted]d by 02/13/2023, by Last Menstrual Period presenting to L&D She woke up around 2 am with contractions. The contractions are strong enough to keep her awake. She also has a strong feeling  in her rectum like she needs to have a BM. She last had a BM yesterday morning. She  tends to have a BM daily sometimes every other day. She sometimes needs to strain to have a BM.  She is not sure if she is leaking fluid, for the last week she has needed to change her underwear a few times because it feels wet. She last had IC over 48 hours ago.   Pregravid weight 97.5 kg Total Weight Gain 4.536 kg  second Problems (from 07/05/22 to present)     Problem Noted Resolved   Supervision of high-risk pregnancy 07/05/2022 by Loran Senters, CMA No   Overview Addendum 12/23/2022  2:01 PM by Kennon Portela, CMA     Clinical Staff Provider  Office Location  [facility] > [place] at [facility]w Dating  Not found.  Language  English Anatomy US  Normal  Flu Vaccine  offer Genetic Screen  NIPS: LR AFP neg  TDaP vaccine   11/26/22 Hgb A1C or  GTT Early : A1C 45.3 Third trimester :   Covid undeclined   LAB RESULTS   Rhogam  A/Positive/-- (01/18 1455)  Blood Type A/Positive/-- (01/18 1455)   Feeding Plan breast Antibody Negative (01/18 1455)  Contraception IUD/tubal Rubella 1.21 (01/18 1455)  Circumcision yes RPR Non Reactive (05/31 0942)   Pediatrician  [facility] HBsAg Negative (01/18 1455)   Support Person Tyler-Keith HIV Non Reactive (05/31 1610)  Prenatal Classes no Varicella     GBS  (For PCN allergy, check sensitivities)   BTL Consent  Hep C Non Reactive (01/18 1455)   VBAC Consent  Pap Diagnosis  Date Value Ref Range Status  09/17/2020   Final   - Negative for intraepithelial lesion or malignancy (NILM)      Hgb Electro      CF      SMA                    Review of  Systems: 10 point review of systems negative unless otherwise noted in HPI  Past Medical History: Patient Active Problem List   Diagnosis Date Noted Date Diagnosed   Uterine contractions 01/14/2023    GDM (gestational diabetes mellitus) 11/29/2022     Diagnosed 6/1    Obesity in pregnancy, antepartum 11/12/2022    Marijuana use 09/16/2022    History of forceps delivery in prior pregnancy, currently pregnant 09/16/2022    Supervision of high-risk pregnancy 07/05/2022      Clinical Staff Provider  Office Location  [facility] > [place] at [facility]w Dating  Not found.  Language  English Anatomy US  Normal  Flu Vaccine  offer Genetic Screen  NIPS: LR AFP neg  TDaP vaccine   11/26/22 Hgb A1C or  GTT Early : A1C 45.3 Third trimester :   Covid undeclined   LAB RESULTS   Rhogam  A/Positive/-- (01/18 1455)  Blood Type A/Positive/-- (01/18 1455)   Feeding Plan breast Antibody Negative (01/18 1455)  Contraception IUD/tubal Rubella 1.21 (01/18 1455)  Circumcision yes RPR Non Reactive (05/31 0942)   Pediatrician  [facility] HBsAg Negative (01/18 1455)   Support  Person Tyler-Keith HIV Non Reactive (05/31 4332)  Prenatal Classes no Varicella     GBS  (For PCN allergy, check sensitivities)   BTL Consent  Hep C Non Reactive (01/18 1455)   VBAC Consent  Pap Diagnosis  Date Value Ref Range Status  09/17/2020   Final   - Negative for intraepithelial lesion or malignancy (NILM)      Hgb Electro      CF      SMA             Anxiety and depression 05/10/2019    ADD (attention deficit disorder) 10/18/2017     Not currently on medication     Past Surgical History: Past Surgical History:  Procedure Laterality Date   ENDOSCOPIC CONCHA BULLOSA RESECTION Bilateral 07/28/2021   Procedure: ENDOSCOPIC CONCHA BULLOSA RESECTION;  Surgeon: Geanie Logan, MD;  Location: [facility];  Service: ENT;  Laterality: Bilateral;   ETHMOIDECTOMY Bilateral 07/28/2021   Procedure: ANTERIOR ETHMOIDECTOMY;   Surgeon: Geanie Logan, MD;  Location: [facility];  Service: ENT;  Laterality: Bilateral;   FOOT SURGERY  10/2019   FOOT SURGERY  11/28/2020   IMAGE GUIDED SINUS SURGERY N/A 07/28/2021   Procedure: IMAGE GUIDED SINUS SURGERY;  Surgeon: Geanie Logan, MD;  Location: [facility];  Service: ENT;  Laterality: N/A;  need stryker disk placed disk on OR charge nurse desk 1-16 kp   MAXILLARY ANTROSTOMY Bilateral 07/28/2021   Procedure: ENDOSCOPIC MAXILLARY ANTROSTOMY WITH TISSUE REMOVAL;  Surgeon: Geanie Logan, MD;  Location: [facility];  Service: ENT;  Laterality: Bilateral;   MYRINGOTOMY WITH TUBE PLACEMENT Left 08/16/2018   Procedure: MYRINGOTOMY WITH TUBE PLACEMENT;  Surgeon: Geanie Logan, MD;  Location: [facility];  Service: ENT;  Laterality: Left;   TONSILLECTOMY  2014   Pt not sure if addenoids taken   TYMPANOPLASTY WITH GRAFT Right 08/16/2018   Procedure: TYMPANOMASTOIDECTOMY WITH POSSIBLE OSSICULAR GRAFT;  Surgeon: Geanie Logan, MD;  Location: [facility];  Service: ENT;  Laterality: Right;   WISDOM TOOTH EXTRACTION  2014    Past Obstetric History: # 1 - Date: 04/23/18, Sex: Female, Weight: 3160 g, GA: [redacted]w[redacted]d, Type: Vaginal, Forceps, Apgar1: 8, Apgar5: 9, Living: Living, Birth Comments: None  # 2 - Date: None, Sex: None, Weight: None, GA: None, Type: None, Apgar1: None, Apgar5: None, Living: None, Birth Comments: None   Past Gynecologic History:  Family History: Family History  Problem Relation Age of Onset   Hypertension Mother    Hypertension Father    Healthy Brother    Healthy Brother    Healthy Brother    Hypertension Maternal Grandmother    Diabetes Maternal Grandmother    Thyroid disease Maternal Grandmother    Hypertension Maternal Grandfather    Thyroid disease Maternal Grandfather    Hypertension Paternal Grandmother    Thyroid disease Paternal Grandmother    Hypertension Paternal Grandfather    Thyroid disease Paternal Grandfather     Hypertension Maternal Aunt    Hypertension Maternal Uncle    Hypertension Paternal Aunt    Diabetes Paternal Aunt    Hypertension Paternal Uncle     Social History: 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  Tobacco Use   Smoking status: Some Days    Types: E-cigarettes  Smokeless tobacco: Never  Vaping Use   Vaping status: Former  Substance and Sexual Activity   Alcohol use: Not Currently    Comment: can't remember last use- only occ   Drug use: Yes    Types: Marijuana    Comment: has stopped completely   Sexual activity: Yes    Partners: Male    Birth control/protection: Surgical    Comment: hx IUD- removed 3 yrs ago  Other Topics Concern   Not on file  Social History Narrative   Not on file   Social Determinants of Health   Financial Resource Strain: Medium Risk (07/05/2022)   Overall Financial Resource Strain (CARDIA)    Difficulty of Paying Living Expenses: Somewhat hard  Food Insecurity: Food Insecurity Present (07/05/2022)   Hunger Vital Sign    Worried About Running Out of Food in the Last Year: Sometimes true    Ran Out of Food in the Last Year: Sometimes true  Transportation Needs: No Transportation Needs (07/05/2022)   PRAPARE - Administrator, Civil Service (Medical): No    Lack of Transportation (Non-Medical): No  Physical Activity: Inactive (07/05/2022)   Exercise Vital Sign    Days of Exercise per Week: 0 days    Minutes of Exercise per Session: 0 min  Stress: No Stress Concern Present (07/05/2022)   [employer] of Occupational Health - Occupational Stress Questionnaire    Feeling of Stress : Not at all  Social Connections: Moderately Isolated (07/05/2022)   Social Connection and Isolation Panel [NHANES]    Frequency of Communication with Friends and  Family: More than three times a week    Frequency of Social Gatherings with Friends and Family: Twice a week    Attends Religious Services: Never    Database administrator or Organizations: No    Attends Banker Meetings: Never    Marital Status: Married  Catering manager Violence: Not At Risk (07/05/2022)   Humiliation, Afraid, Rape, and Kick questionnaire    Fear of Current or Ex-Partner: No    Emotionally Abused: No    Physically Abused: No    Sexually Abused: No    Medications: Prior to Admission medications   Medication Sig Start Date End Date Taking? Authorizing Provider  Accu-Chek Softclix Lancets lancets 1 each by Other route 4 (four) times daily. 11/29/22   [place] Bing, MD  albuterol (VENTOLIN HFA) 108 (90 Base) MCG/ACT inhaler Inhale 2 puffs into the lungs every 6 (six) hours as needed for wheezing or shortness of breath. 07/29/22   Tre[place] Mall, CNM  aspirin EC 81 MG tablet Take 1 tablet (81 mg total) by mouth daily. Start taking when you are [redacted] weeks pregnant for rest of pregnancy for prevention of preeclampsia 09/14/22   Anyanwu, Jethro Bastos, MD  Blood Glucose Monitoring Suppl (ACCU-CHEK GUIDE) w/Device KIT 1 Device by Does not apply route 4 (four) times daily. 11/29/22   [place] Bing, MD  budesonide (RHINOCORT AQUA) 32 MCG/ACT nasal spray Place into the nose. 08/01/22 08/31/22  [provider]  Cholecalciferol (D3-1000) 25 MCG (1000 UT) tablet Take 2,000 Units by mouth daily. Patient not taking: Reported on 02/08/2022    [provider]  ferrous sulfate 324 MG TBEC Take 1 tablet (324 mg total) by mouth every other day for 42 doses. Patient not taking: Reported on 12/10/2022 11/29/22 02/20/23  [place] Bing, MD  glucose blood (ACCU-CHEK GUIDE) test strip Use to check blood sugars four times a day was instructed  11/29/22   [place] Bing, MD  montelukast (SINGULAIR) 10 MG tablet Take 10 mg by mouth daily. 03/20/19   [provider]   Prenat-FeCbn-FeAsp-Meth-FA-DHA (PRENATE MINI) 18-0.6-0.4-350 MG CAPS Take 1 tablet by mouth daily. 07/20/22   Glenetta Borg, CNM    Allergies: Allergies  Allergen Reactions   Dust Mite Extract Shortness Of Breath and Swelling   Bee Pollen Other (See Comments)   Penicillins Hives    Did it involve swelling of the face/tongue/throat, SOB, or low BP? Unknown Did it involve sudden or severe rash/hives, skin peeling, or any reaction on the inside of your mouth or nose? No Did you need to seek medical attention at a hospital or doctor's office? No When did it last happen?      Childhood allergy   Pollen Extract    Amoxicillin Hives    Did it involve swelling of the face/tongue/throat, SOB, or low BP? Unknown Did it involve sudden or severe rash/hives, skin peeling, or any reaction on the inside of your mouth or nose? No Did you need to seek medical attention at a hospital or doctor's office? No When did it last happen?      Childhood allergy If all above answers are "NO", may proceed with cephalosporin use.     Physical Exam: Vitals: Blood pressure 103/61, pulse (!) 101, temperature 97.8 F (36.6 C), temperature source Oral, height 5\' 7"  (1.702 m), weight 103 kg, last menstrual period 05/09/2022.  FHT: baseline 125, moderate variability, pos accel, neg decel Toco: rare   General: NAD HEENT: normocephalic, anicteric Pulmonary: No increased work of breathing Cardiovascular: RRR, distal pulses 2+ Abdomen: Gravid, non-tender Leopolds: Genitourinary: external WNL  Extremities: no edema, erythema, or tenderness Neurologic: Grossly intact Psychiatric: mood appropriate, affect full  Labs: No results found for this or any previous visit (from the past 24 hour(s)).  Assessment: 27 y.o. G2P1001 [redacted]w[redacted]d by 02/13/2023, by Last Menstrual Period uterine contractions.   Plan: 1)IV fluid bolus and ROM plus ordered.  2) Fetus -category I tracing   3) PNL - Blood type A/Positive/--  (01/18 1455) / Anti-bodyscreen Negative (01/18 1455) / Rubella 1.21 (01/18 1455) / Varicella Immune / RPR Non Reactive (05/31 0942) / HBsAg Negative (01/18 1455) / HIV Non Reactive (05/31 0942) /   4) Immunization History -  Immunization History  Administered Date(s) Administered   DTaP 11/01/1994, 01/05/1995, 03/15/1995, 01/17/1996, 05/28/1999   HIB (PRP-T) 11/01/1994, 01/05/1995, 03/15/1995, 01/17/1996   Hepatitis B, PED/ADOLESCENT [birth year] 1994, 11/01/1994, 03/15/1995   IPV 11/01/1994, 01/05/1995, 03/15/1995, 05/28/1999   Influenza,inj,Quad PF,6+ Mos 02/24/2018   Influenza-Unspecified 09/06/2017   MMR 01/17/1996, 05/28/1999   Tdap 02/24/2018, 11/26/2022   Varicella 01/17/1996    5) Disposition - anticipate discharge later today.   Carie Caddy CNM  [facility] 01/14/2023, 6:55 AM

## 2023-01-14 NOTE — OB Triage Note (Signed)
27 y.o G2P1 presents to L&D triage at [redacted]w[redacted]d w c/o ctx's and lof. ROM+ returned negative. NST is reassuring and pt reports having minimal pain at the moment. She received 1L of IV LR. Pt being discharged home by Dominic CNM stable. Discharge instructions reviewed and pt verbalized understanding.

## 2023-01-14 NOTE — OB Triage Note (Signed)
Patient G2P1 is [redacted]w[redacted]d is seen at [facility], last seen by AOB here in June. Today pt is c/o CTX every 5-10 minutes since around 2 am. Rates pain 8/10. Hx GDM since 6/1, denies any further complaints, no fluid leaking, no urinary symptoms. FHR 135 CAT 1

## 2023-01-14 NOTE — Discharge Summary (Signed)
Physician Final Progress Note  Patient ID: Beth Willis MRN: [id number] DOB/AGE: 28-12-1995 28 y.o.  Admit date: 01/14/2023 Admitting provider: Linzie Collin, MD Discharge date: 01/14/2023   Admission Diagnoses:  1) intrauterine pregnancy at [redacted]w[redacted]d    Discharge Diagnoses:  Principal Problem:   Uterine contractions  Not in labor, membranes intact   History of Present Illness  27 y.o. G2P1001 [redacted]w[redacted]d by 02/13/2023, by Last Menstrual Period presenting to L&D She woke up around 2 am with contractions. The contractions are strong enough to keep her awake. She also has a strong feeling  in her rectum like she needs to have a BM. She last had a BM yesterday morning. She  tends to have a BM daily sometimes every other day. She sometimes needs to strain to have a BM.  She is not sure if she is leaking fluid, for the last week she has needed to change her underwear a few times because it feels wet. She last had IC over 48 hours ago.    Past Surgical History:  Procedure Laterality Date   ENDOSCOPIC CONCHA BULLOSA RESECTION Bilateral 07/28/2021   Procedure: ENDOSCOPIC CONCHA BULLOSA RESECTION;  Surgeon: Geanie Logan, MD;  Location: [facility];  Service: ENT;  Laterality: Bilateral;   ETHMOIDECTOMY Bilateral 07/28/2021   Procedure: ANTERIOR ETHMOIDECTOMY;  Surgeon: Geanie Logan, MD;  Location: [facility];  Service: ENT;  Laterality: Bilateral;   FOOT SURGERY  10/2019   FOOT SURGERY  11/28/2020   IMAGE GUIDED SINUS SURGERY N/A 07/28/2021   Procedure: IMAGE GUIDED SINUS SURGERY;  Surgeon: Geanie Logan, MD;  Location: [facility];  Service: ENT;  Laterality: N/A;  need stryker disk placed disk on OR charge nurse desk 1-16 kp   MAXILLARY ANTROSTOMY Bilateral 07/28/2021   Procedure: ENDOSCOPIC MAXILLARY ANTROSTOMY WITH TISSUE REMOVAL;  Surgeon: Geanie Logan, MD;  Location: [facility];  Service: ENT;  Laterality: Bilateral;   MYRINGOTOMY WITH TUBE PLACEMENT Left  08/16/2018   Procedure: MYRINGOTOMY WITH TUBE PLACEMENT;  Surgeon: Geanie Logan, MD;  Location: [facility] OR[facility];  Service: ENT;  Laterality: Left;   TONSILLECTOMY  2014   Pt not sure if addenoids taken   TYMPANOPLASTY WITH GRAFT Right 08/16/2018   Procedure: TYMPANOMASTOIDECTOMY WITH POSSIBLE OSSICULAR GRAFT;  Surgeon: Geanie Logan, MD;  Location: [facility];  Service: ENT;  Laterality: Right;   WISDOM TOOTH EXTRACTION  2014    No current facility-administered medications on file prior to encounter.   Current Outpatient Medications on File Prior to Encounter  Medication Sig Dispense Refill   Accu-Chek Softclix Lancets lancets 1 each by Other route 4 (four) times daily. 100 each 12   albuterol (VENTOLIN HFA) 108 (90 Base) MCG/ACT inhaler Inhale 2 puffs into the lungs every 6 (six) hours as needed for wheezing or shortness of breath. 8 g 2   aspirin EC 81 MG tablet Take 1 tablet (81 mg total) by mouth daily. Start taking when you are [redacted] weeks pregnant for rest of pregnancy for prevention of preeclampsia 300 tablet 2   Blood Glucose Monitoring Suppl (ACCU-CHEK GUIDE) w/Device KIT 1 Device by Does not apply route 4 (four) times daily. 1 kit 0   budesonide (RHINOCORT AQUA) 32 MCG/ACT nasal spray Place into the nose.     Cholecalciferol (D3-1000) 25 MCG (1000 UT) tablet Take 2,000 Units by mouth daily. (Patient not taking: Reported on 02/08/2022)     ferrous sulfate 324 MG TBEC Take 1 tablet (324 mg total) by mouth every other  day for 42 doses. (Patient not taking: Reported on 12/10/2022) 42 tablet 0   glucose blood (ACCU-CHEK GUIDE) test strip Use to check blood sugars four times a day was instructed 50 each 12   montelukast (SINGULAIR) 10 MG tablet Take 10 mg by mouth daily.     Prenat-FeCbn-FeAsp-Meth-FA-DHA (PRENATE MINI) 18-0.6-0.4-350 MG CAPS Take 1 tablet by mouth daily. 30 capsule 8    Allergies  Allergen Reactions   Dust Mite Extract Shortness Of Breath and Swelling   Bee Pollen Other (See  Comments)   Penicillins Hives    Did it involve swelling of the face/tongue/throat, SOB, or low BP? Unknown Did it involve sudden or severe rash/hives, skin peeling, or any reaction on the inside of your mouth or nose? No Did you need to seek medical attention at a hospital or doctor's office? No When did it last happen?      Childhood allergy   Pollen Extract    Amoxicillin Hives    Did it involve swelling of the face/tongue/throat, SOB, or low BP? Unknown Did it involve sudden or severe rash/hives, skin peeling, or any reaction on the inside of your mouth or nose? No Did you need to seek medical attention at a hospital or doctor's office? No When did it last happen?      Childhood allergy If all above answers are "NO", may proceed with cephalosporin use.     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  Tobacco Use   Smoking status: Some Days    Types: E-cigarettes   Smokeless tobacco: Never  Vaping Use   Vaping status: Former  Substance and Sexual Activity   Alcohol use: Not Currently    Comment: can't remember last use- only occ   Drug use: Yes    Types: Marijuana    Comment: has stopped completely   Sexual activity: Yes    Partners: Male    Birth control/protection: Surgical    Comment: hx IUD- removed 3 yrs ago  Other Topics Concern   Not on file  Social History Narrative   Not on file   Social Determinants of Health   Financial Resource Strain: Medium Risk (07/05/2022)   Overall Financial Resource Strain (CARDIA)    Difficulty of Paying Living Expenses: Somewhat hard  Food Insecurity: Food Insecurity Present (07/05/2022)   Hunger Vital Sign    Worried About Running Out of Food in the Last Year: Sometimes true    Ran Out of Food in the Last Year: Sometimes true  Transportation  Needs: No Transportation Needs (07/05/2022)   PRAPARE - Administrator, Civil Service (Medical): No    Lack of Transportation (Non-Medical): No  Physical Activity: Inactive (07/05/2022)   Exercise Vital Sign    Days of Exercise per Week: 0 days    Minutes of Exercise per Session: 0 min  Stress: No Stress Concern Present (07/05/2022)   [employer] of Occupational Health - Occupational Stress Questionnaire    Feeling of Stress : Not at all  Social Connections: Moderately Isolated (07/05/2022)   Social Connection and Isolation Panel [NHANES]    Frequency of Communication with Friends and Family: More than three times a week    Frequency of Social Gatherings with Friends and Family: Twice a week  Attends Religious Services: Never    Active Member of Clubs or Organizations: No    Attends Banker Meetings: Never    Marital Status: Married  Catering manager Violence: Not At Risk (07/05/2022)   Humiliation, Afraid, Rape, and Kick questionnaire    Fear of Current or Ex-Partner: No    Emotionally Abused: No    Physically Abused: No    Sexually Abused: No    Family History  Problem Relation Age of Onset   Hypertension Mother    Hypertension Father    Healthy Brother    Healthy Brother    Healthy Brother    Hypertension Maternal Grandmother    Diabetes Maternal Grandmother    Thyroid disease Maternal Grandmother    Hypertension Maternal Grandfather    Thyroid disease Maternal Grandfather    Hypertension Paternal Grandmother    Thyroid disease Paternal Grandmother    Hypertension Paternal Grandfather    Thyroid disease Paternal Grandfather    Hypertension Maternal Aunt    Hypertension Maternal Uncle    Hypertension Paternal Aunt    Diabetes Paternal Aunt    Hypertension Paternal Uncle      ROS see HPI   Physical Exam: BP 103/61 (BP Location: Right Arm)   Pulse (!) 101   Temp 97.8 F (36.6 C) (Oral)   Ht 5\' 7"  (1.702 m)   Wt 103 kg   LMP 05/09/2022  (Exact Date) Comment: approx 2 months pregnant  BMI 35.56 kg/m   Physical Exam Constitutional:      Appearance: Normal appearance.  Genitourinary:     Vulva normal.     Genitourinary Comments: SVE closed, thick, -2      No vaginal discharge.  Pulmonary:     Effort: Pulmonary effort is normal.  Abdominal:     Tenderness: There is no abdominal tenderness.     Comments: gravid  Musculoskeletal:     Cervical back: Normal range of motion.     Right lower leg: No edema.     Left lower leg: No edema.  Neurological:     General: No focal deficit present.     Mental Status: She is alert.  Skin:    General: Skin is warm.  Psychiatric:        Mood and Affect: Mood normal.    EFM: baseline 125, moderate variability, pos accel, neg deecl TOCO: rare   Consults: None  Significant Findings/ Diagnostic Studies: ROM plus negative   Procedures: RNST  Hospital Course: The patient was admitted to Labor and Delivery Triage for observation. She was given 1 liter of lactated ringers. The ROM plus was negative. Her cervix is closed. Pt reported contraction pain improved after fluid bolus. Discussed the rectal pressure could be related to the need to have a BM, comfort measures reviewed. Pt has an [place] scheduled at [facility] on Monday and a ROB later next week with her provider. Pt was discharged home.   Discharge Condition: good  Disposition: Discharge disposition: 01-Home or Self Care       Diet: Regular diet  Discharge Activity: Activity as tolerated   Allergies as of 01/14/2023       Reactions   Dust Mite Extract Shortness Of Breath, Swelling   Bee Pollen Other (See Comments)   Penicillins Hives   Did it involve swelling of the face/tongue/throat, SOB, or low BP? Unknown Did it involve sudden or severe rash/hives, skin peeling, or any reaction on the inside of your mouth or nose? No Did  you need to seek medical attention at a hospital or doctor's office? No When did it last happen?       Childhood allergy   Pollen Extract    Amoxicillin Hives   Did it involve swelling of the face/tongue/throat, SOB, or low BP? Unknown Did it involve sudden or severe rash/hives, skin peeling, or any reaction on the inside of your mouth or nose? No Did you need to seek medical attention at a hospital or doctor's office? No When did it last happen?      Childhood allergy If all above answers are "NO", may proceed with cephalosporin use.        Medication List     TAKE these medications    Accu-Chek Guide test strip Generic drug: glucose blood Use to check blood sugars four times a day was instructed   Accu-Chek Guide w/Device Kit 1 Device by Does not apply route 4 (four) times daily.   Accu-Chek Softclix Lancets lancets 1 each by Other route 4 (four) times daily.   albuterol 108 (90 Base) MCG/ACT inhaler Commonly known as: VENTOLIN HFA Inhale 2 puffs into the lungs every 6 (six) hours as needed for wheezing or shortness of breath.   aspirin EC 81 MG tablet Take 1 tablet (81 mg total) by mouth daily. Start taking when you are [redacted] weeks pregnant for rest of pregnancy for prevention of preeclampsia   budesonide 32 MCG/ACT nasal spray Commonly known as: RHINOCORT AQUA Place into the nose.   D3-1000 25 MCG (1000 UT) tablet Generic drug: Cholecalciferol Take 2,000 Units by mouth daily.   ferrous sulfate 324 MG Tbec Take 1 tablet (324 mg total) by mouth every other day for 42 doses.   montelukast 10 MG tablet Commonly known as: SINGULAIR Take 10 mg by mouth daily.   Prenate Mini 18-0.6-0.4-350 MG Caps Take 1 tablet by mouth daily.         Total time spent taking care of this patient: 20 minutes  Signed: Ellouise Newer [facility], CNM  01/14/2023, 7:46 AM

## 2023-01-17 ENCOUNTER — Other Ambulatory Visit: Payer: Self-pay

## 2023-01-17 ENCOUNTER — Ambulatory Visit: Payer: Medicaid Other | Attending: Maternal & Fetal Medicine

## 2023-01-17 VITALS — BP 120/81 | HR 95 | Temp 97.8°F | Ht 67.0 in | Wt 231.0 lb

## 2023-01-17 DIAGNOSIS — O99210 Obesity complicating pregnancy, unspecified trimester: Secondary | ICD-10-CM

## 2023-01-17 DIAGNOSIS — O24410 Gestational diabetes mellitus in pregnancy, diet controlled: Secondary | ICD-10-CM | POA: Insufficient documentation

## 2023-01-17 DIAGNOSIS — O403XX0 Polyhydramnios, third trimester, not applicable or unspecified: Secondary | ICD-10-CM

## 2023-01-17 DIAGNOSIS — O0993 Supervision of high risk pregnancy, unspecified, third trimester: Secondary | ICD-10-CM

## 2023-01-17 DIAGNOSIS — Z3A36 36 weeks gestation of pregnancy: Secondary | ICD-10-CM | POA: Diagnosis not present

## 2023-01-17 DIAGNOSIS — O99213 Obesity complicating pregnancy, third trimester: Secondary | ICD-10-CM | POA: Insufficient documentation

## 2023-01-17 DIAGNOSIS — Z3480 Encounter for supervision of other normal pregnancy, unspecified trimester: Secondary | ICD-10-CM

## 2023-01-17 DIAGNOSIS — E669 Obesity, unspecified: Secondary | ICD-10-CM

## 2023-01-17 DIAGNOSIS — O3663X0 Maternal care for excessive fetal growth, third trimester, not applicable or unspecified: Secondary | ICD-10-CM | POA: Insufficient documentation

## 2023-01-18 ENCOUNTER — Encounter: Payer: Self-pay | Admitting: Family Medicine

## 2023-01-18 DIAGNOSIS — O3660X0 Maternal care for excessive fetal growth, unspecified trimester, not applicable or unspecified: Secondary | ICD-10-CM | POA: Insufficient documentation

## 2023-01-20 ENCOUNTER — Other Ambulatory Visit (HOSPITAL_COMMUNITY)
Admission: RE | Admit: 2023-01-20 | Discharge: 2023-01-20 | Disposition: A | Discharge status: Home / Self Care | Payer: Medicaid Other | Source: Ambulatory Visit | Attending: Obstetrics & Gynecology | Admitting: Obstetrics & Gynecology

## 2023-01-20 ENCOUNTER — Encounter: Payer: Medicaid Other | Admitting: Obstetrics & Gynecology

## 2023-01-20 ENCOUNTER — Encounter (HOSPITAL_COMMUNITY): Payer: Self-pay | Admitting: *Deleted

## 2023-01-20 ENCOUNTER — Ambulatory Visit (INDEPENDENT_AMBULATORY_CARE_PROVIDER_SITE_OTHER): Payer: Medicaid Other | Admitting: Obstetrics & Gynecology

## 2023-01-20 ENCOUNTER — Telehealth (HOSPITAL_COMMUNITY): Payer: Self-pay | Admitting: *Deleted

## 2023-01-20 VITALS — BP 132/88 | HR 91 | Wt 230.0 lb

## 2023-01-20 DIAGNOSIS — O3663X0 Maternal care for excessive fetal growth, third trimester, not applicable or unspecified: Secondary | ICD-10-CM

## 2023-01-20 DIAGNOSIS — O24419 Gestational diabetes mellitus in pregnancy, unspecified control: Secondary | ICD-10-CM

## 2023-01-20 DIAGNOSIS — O0993 Supervision of high risk pregnancy, unspecified, third trimester: Secondary | ICD-10-CM

## 2023-01-20 DIAGNOSIS — R7309 Other abnormal glucose: Secondary | ICD-10-CM

## 2023-01-20 DIAGNOSIS — Z3A36 36 weeks gestation of pregnancy: Secondary | ICD-10-CM

## 2023-01-20 NOTE — Progress Notes (Signed)
PRENATAL VISIT NOTE  Subjective:  Beth Willis is a 27 y.o. G2P1001 at [redacted]w[redacted]d being seen today for ongoing prenatal care.  She is currently monitored for the following issues for this high-risk pregnancy and has ADD (attention deficit disorder); Anxiety and depression; Supervision of high-risk pregnancy; Marijuana use; History of forceps delivery in prior pregnancy, currently pregnant; Obesity in pregnancy, antepartum; GDM (gestational diabetes mellitus); Uterine contractions; and LGA (large for gestational age) fetus affecting management of mother on their problem list.  Patient reports occasional contractions.  Contractions: Irritability. Vag. Bleeding: None.  Movement: Present. Denies leaking of fluid.   The following portions of the patient's history were reviewed and updated as appropriate: allergies, current medications, past family history, past medical history, past social history, past surgical history and problem list.   Objective:   Vitals:   01/20/23 0930  BP: 132/88  Pulse: 91  Weight: 230 lb (104.3 kg)    Fetal Status: Fetal Heart Rate (bpm): 135   Movement: Present  Presentation: Vertex  General:  Alert, oriented and cooperative. Patient is in no acute distress.  Skin: Skin is warm and dry. No rash noted.   Cardiovascular: Normal heart rate noted  Respiratory: Normal respiratory effort, no problems with respiration noted  Abdomen: Soft, gravid, appropriate for gestational age.  Pain/Pressure: Present     Pelvic: Cervical exam performed in the presence of a chaperone Dilation: Fingertip Effacement (%): Thick Station: Ballotable. Cultures done.  Extremities: Normal range of motion.  Edema: Trace  Mental Status: Normal mood and affect. Normal behavior. Normal judgment and thought content.   Imaging: [place] MFM OB FOLLOW UP  Result Date: 01/17/2023 ----------------------------------------------------------------------  OBSTETRICS REPORT                       (Signed  Final 01/17/2023 04:34 pm) ---------------------------------------------------------------------- Patient Info  ID #:       [id number]                          D.O.B.:  Oct 09, 1995 (27 yrs)  Name:       Beth Willis             Visit Date: 01/17/2023 03:54 pm ---------------------------------------------------------------------- Performed By  Attending:        Braxton Feathers DO       Ref. Address:     [address]                                                             [address]  Performed By:     Reinaldo Raddle            [facility]  Referred By:      [facility] ---------------------------------------------------------------------- Orders  #  Description                           Code        Ordered By  1  [place] MFM OB FOLLOW UP                   [phone]    Lyndel Safe ----------------------------------------------------------------------  #  Order #                     Accession #                Episode #  1  [id number]                   [id number]                 [id number] ---------------------------------------------------------------------- Indications  Gestational diabetes in pregnancy, diet        O24.410  controlled  Large for gestational age fetus affecting      O36.60X0  management of mother  Obesity complicating pregnancy, third          O99.213  trimester (pregravid BMI 33)  [redacted] weeks gestation of pregnancy                Z3A.36  Low risk NIPS ---------------------------------------------------------------------- Fetal Evaluation  Num Of Fetuses:         1  Fetal Heart Rate(bpm):  132  Cardiac Activity:       Observed  Presentation:           Cephalic  Placenta:               Anterior  P. Cord Insertion:      Previously visualized  Amniotic Fluid  AFI FV:      Within normal limits  AFI Sum(cm)     %Tile       Largest  Pocket(cm)  16.1            60          8.51  RUQ(cm)       RLQ(cm)       LUQ(cm)        LLQ(cm)  8.51          3.7           1.14           2.75 ---------------------------------------------------------------------- Biometry  BPD:      93.6  mm     G. Age:  38w 1d         95  %    CI:        77.39   %    70 - 86                                                          FL/HC:      21.9   %    20.1 - 22.1  HC:    336.83   mm     G. Age:  38w 4d         79  %    HC/AC:  0.95        0.93 - 1.11  AC:    353.39   mm     G. Age:  39w 2d       > 99  %    FL/BPD:     78.9   %    71 - 87  FL:      73.85  mm     G. Age:  37w 6d         84  %    FL/AC:      20.9   %    20 - 24  HUM:      62.9  mm     G. Age:  36w 4d         70  %  LV:        3.2  mm  Est. FW:    3572  gm    7 lb 14 oz      98  % ---------------------------------------------------------------------- OB History  Gravidity:    2         Term:   1        Prem:   0        SAB:   0  TOP:          0       Ectopic:  0        Living: 1 ---------------------------------------------------------------------- Gestational Age  LMP:           36w 1d        Date:  05/09/22                 EDD:   02/13/23  U/S Today:     38w 3d                                        EDD:   01/28/23  Best:          36w 1d     Det. By:  LMP  (05/09/22)          EDD:   02/13/23 ---------------------------------------------------------------------- Anatomy  Cranium:               Appears normal         Aortic Arch:            Previously seen  Cavum:                 Previously seen        Ductal Arch:            Previously seen  Ventricles:            Appears normal         Diaphragm:              Appears normal  Choroid Plexus:        Previously seen        Stomach:                Appears normal, left  sided  Cerebellum:            Previously seen        Abdomen:                Appears normal  Posterior Fossa:       Previously  seen        Abdominal Wall:         Previously seen  Nuchal Fold:           Not applicable (>20    Cord Vessels:           Previously seen                         wks GA)  Face:                  Profile nl; orbits     Kidneys:                Appear normal                         prev visualized  Lips:                  Previously seen        Bladder:                Appears normal  Thoracic:              Appears normal         Spine:                  Previously seen  Heart:                 Appears normal         Upper Extremities:      Previously seen                         (4CH, axis, and                         situs)  RVOT:                  Previously seen        Lower Extremities:      Previously seen  LVOT:                  Previously seen  Other:  3VTV, Heels/feet and open hands/5th digits previously visualized.          Nasal bone, lenses, maxilla, mandible and falx previously visualized          VC and 3VV previously visualized. Fetus appears to be a female. ---------------------------------------------------------------------- Cervix Uterus Adnexa  Cervix  Not visualized (advanced GA >24wks)  Uterus  No abnormality visualized.  Right Ovary  Not visualized.  Left Ovary  Not visualized.  Cul De Sac  No free fluid seen.  Adnexa  No adnexal mass visualized ---------------------------------------------------------------------- Comments  The patient is here for ultrasound at 36w 1d. EDD:  02/13/2023 dated by LMP  (05/09/22).  Her pregnancy is  complicated by gDMA1. She has no further concerns today  and reports good glycemic control.  Sonographic findings  Single intrauterine pregnancy.  Fetal cardiac activity:  Observed and appears normal.  Presentation: Cephalic.  Interval fetal anatomy appears normal.  Fetal biometry shows the estimated fetal weight at the 98  percentile and the abdominal circumference at the >99  percentile.  Amniotic fluid volume: Within normal limits. MVP: 8.51 cm.  Placenta: Anterior.   Recommendations  - IOL around 39 weeks ----------------------------------------------------------------------                  Braxton Feathers, DO Electronically Signed Final Report   01/17/2023 04:34 pm ----------------------------------------------------------------------    Assessment and Plan:  Pregnancy: G2P1001 at [redacted]w[redacted]d 1. Gestational diabetes mellitus (GDM) in third trimester  2. Excessive fetal growth affecting management of pregnancy in third trimester, single or unspecified fetus 3. Poor glycemic control Reviewed BS, fastings are mostly within range but very abnormal PPs.  Half of values are elevated, 140s-200s. Patient also had 01/17/23 scan that showed EFW EFW >98%, AC >99%.  Concerned about poor glycemic control around term, discussed need for earlier IOL.  Discussed this with Dr. Darra Lis, MFM, who agreed with recommendation for IOL 37-38 weeks.  Patient preferred 37 weeks, she was told this can carry increased risk of neonatal NICU admission compared to 38 weeks, but she wants this date as she is is really concerned about consequences of her elevated blood sugars. IOL scheduled on 01/23/23 at [redacted]w[redacted]d, orders signed and held.  4. [redacted] weeks gestation of pregnancy 5. Supervision of high risk pregnancy in third trimester Pelvic cultures done today, will follow up results and manage accordingly. - Cervicovaginal ancillary only - Strep Gp B Culture+Rflx Labor symptoms and general obstetric precautions including but not limited to vaginal bleeding, contractions, leaking of fluid and fetal movement were reviewed in detail with the patient. Please refer to After Visit Summary for other counseling recommendations.   Return for Postpartum check.  Future Appointments  Date Time Provider Department Center  01/23/2023  6:30 AM [facility] SCHED ROOM [facility] None    Jaynie Collins, MD

## 2023-01-20 NOTE — Telephone Encounter (Signed)
Preadmission screen  

## 2023-01-22 ENCOUNTER — Other Ambulatory Visit: Payer: Self-pay | Admitting: Advanced Practice Midwife

## 2023-01-23 ENCOUNTER — Inpatient Hospital Stay (HOSPITAL_COMMUNITY): Payer: Medicaid Other | Admitting: Anesthesiology

## 2023-01-23 ENCOUNTER — Inpatient Hospital Stay (HOSPITAL_COMMUNITY)
Admission: AD | Admit: 2023-01-23 | Discharge: 2023-01-26 | DRG: 787 | Disposition: A | Discharge status: Home / Self Care | Payer: Medicaid Other | Attending: Family Medicine | Admitting: Family Medicine

## 2023-01-23 ENCOUNTER — Encounter (HOSPITAL_COMMUNITY): Payer: Self-pay | Admitting: Obstetrics & Gynecology

## 2023-01-23 ENCOUNTER — Other Ambulatory Visit: Payer: Self-pay

## 2023-01-23 ENCOUNTER — Inpatient Hospital Stay (HOSPITAL_COMMUNITY): Payer: Medicaid Other

## 2023-01-23 DIAGNOSIS — O99334 Smoking (tobacco) complicating childbirth: Secondary | ICD-10-CM | POA: Diagnosis present

## 2023-01-23 DIAGNOSIS — F17290 Nicotine dependence, other tobacco product, uncomplicated: Secondary | ICD-10-CM | POA: Diagnosis present

## 2023-01-23 DIAGNOSIS — O9081 Anemia of the puerperium: Secondary | ICD-10-CM | POA: Diagnosis not present

## 2023-01-23 DIAGNOSIS — O3663X0 Maternal care for excessive fetal growth, third trimester, not applicable or unspecified: Secondary | ICD-10-CM | POA: Diagnosis present

## 2023-01-23 DIAGNOSIS — Z5986 Financial insecurity: Secondary | ICD-10-CM

## 2023-01-23 DIAGNOSIS — O24420 Gestational diabetes mellitus in childbirth, diet controlled: Secondary | ICD-10-CM | POA: Diagnosis present

## 2023-01-23 DIAGNOSIS — Z3A37 37 weeks gestation of pregnancy: Secondary | ICD-10-CM | POA: Diagnosis not present

## 2023-01-23 DIAGNOSIS — Z880 Allergy status to penicillin: Secondary | ICD-10-CM | POA: Diagnosis not present

## 2023-01-23 DIAGNOSIS — Z975 Presence of (intrauterine) contraceptive device: Secondary | ICD-10-CM

## 2023-01-23 DIAGNOSIS — D62 Acute posthemorrhagic anemia: Secondary | ICD-10-CM | POA: Diagnosis not present

## 2023-01-23 DIAGNOSIS — Z30430 Encounter for insertion of intrauterine contraceptive device: Secondary | ICD-10-CM | POA: Diagnosis not present

## 2023-01-23 DIAGNOSIS — O326XX0 Maternal care for compound presentation, not applicable or unspecified: Secondary | ICD-10-CM | POA: Diagnosis present

## 2023-01-23 DIAGNOSIS — O24419 Gestational diabetes mellitus in pregnancy, unspecified control: Secondary | ICD-10-CM | POA: Diagnosis present

## 2023-01-23 DIAGNOSIS — Z98891 History of uterine scar from previous surgery: Secondary | ICD-10-CM

## 2023-01-23 DIAGNOSIS — R7309 Other abnormal glucose: Secondary | ICD-10-CM | POA: Diagnosis present

## 2023-01-23 LAB — COMPREHENSIVE METABOLIC PANEL
ALT: 11 U/L (ref 0–44)
AST: 18 U/L (ref 15–41)
Albumin: 2.6 g/dL — ABNORMAL LOW (ref 3.5–5.0)
Alkaline Phosphatase: 121 U/L (ref 38–126)
Anion gap: 10 (ref 5–15)
BUN: 7 mg/dL (ref 6–20)
CO2: 20 mmol/L — ABNORMAL LOW (ref 22–32)
Calcium: 9 mg/dL (ref 8.9–10.3)
Chloride: 104 mmol/L (ref 98–111)
Creatinine, Ser: 0.81 mg/dL (ref 0.44–1.00)
GFR, Estimated: >60 mL/min (ref >60)
Glucose, Bld: 99 mg/dL (ref 70–99)
Potassium: 3.5 mmol/L (ref 3.5–5.1)
Sodium: 134 mmol/L — ABNORMAL LOW (ref 135–145)
Total Bilirubin: 0.3 mg/dL (ref 0.3–1.2)
Total Protein: 6.2 g/dL — ABNORMAL LOW (ref 6.5–8.1)

## 2023-01-23 LAB — CBC
HCT: 31.1 % — ABNORMAL LOW (ref 36.0–46.0)
Hemoglobin: 10.2 g/dL — ABNORMAL LOW (ref 12.0–15.0)
MCH: 27.9 pg (ref 26.0–34.0)
MCHC: 32.8 g/dL (ref 30.0–36.0)
MCV: 85.2 fL (ref 80.0–100.0)
Platelets: 203 10*3/uL (ref 150–400)
RBC: 3.65 MIL/uL — ABNORMAL LOW (ref 3.87–5.11)
RDW: 15.3 % (ref 11.5–15.5)
WBC: 10.6 10*3/uL — ABNORMAL HIGH (ref 4.0–10.5)
nRBC: 0 % (ref 0.0–0.2)

## 2023-01-23 LAB — GLUCOSE, CAPILLARY
Glucose-Capillary: 83 mg/dL (ref 70–99)
Glucose-Capillary: 97 mg/dL (ref 70–99)
Glucose-Capillary: 114 mg/dL — ABNORMAL HIGH (ref 70–99)
Glucose-Capillary: 69 mg/dL — ABNORMAL LOW (ref 70–99)
Glucose-Capillary: 86 mg/dL (ref 70–99)

## 2023-01-23 LAB — TYPE AND SCREEN
ABO/RH(D): A POS
Antibody Screen: NEGATIVE

## 2023-01-23 MED ORDER — LIDOCAINE HCL (PF) 1 % IJ SOLN
INTRAMUSCULAR | Status: DC | PRN
  Administered 2023-01-23: 3 mL via EPIDURAL
  Administered 2023-01-23: 5 mL via EPIDURAL
  Administered 2023-01-23: 2 mL via EPIDURAL

## 2023-01-23 MED ORDER — FLEET ENEMA 7-19 GM/118ML RE ENEM: 1.0000 | ENEMA | Freq: Every day | RECTAL | Status: DC | PRN

## 2023-01-23 MED ORDER — TERBUTALINE SULFATE 1 MG/ML IJ SOLN: 0.2500 mg | Freq: Once | INTRAMUSCULAR | Status: DC | PRN

## 2023-01-23 MED ORDER — ONDANSETRON HCL 4 MG/2ML IJ SOLN: 4.0000 mg | Freq: Four times a day (QID) | INTRAMUSCULAR | Status: DC | PRN

## 2023-01-23 MED ORDER — PHENYLEPHRINE 80 MCG/ML (10ML) SYRINGE FOR IV PUSH (FOR BLOOD PRESSURE SUPPORT): 80.0000 ug | PREFILLED_SYRINGE | INTRAVENOUS | Status: DC | PRN

## 2023-01-23 MED ORDER — MISOPROSTOL 25 MCG QUARTER TABLET
25.0000 ug | ORAL_TABLET | Freq: Once | ORAL | Status: AC
  Administered 2023-01-23: 25 ug via VAGINAL
  Filled 2023-01-23: qty 1

## 2023-01-23 MED ORDER — MISOPROSTOL 50MCG HALF TABLET
50.0000 ug | ORAL_TABLET | Freq: Once | ORAL | Status: AC
  Administered 2023-01-23: 50 ug via ORAL
  Filled 2023-01-23: qty 1

## 2023-01-23 MED ORDER — FENTANYL-BUPIVACAINE-NACL 0.5-0.125-0.9 MG/250ML-% EP SOLN
12.0000 mL/h | EPIDURAL | Status: DC | PRN
  Administered 2023-01-23: 12 mL/h via EPIDURAL
  Filled 2023-01-23 (×2): qty 250

## 2023-01-23 MED ORDER — HYDROXYZINE HCL 50 MG PO TABS: 50.0000 mg | ORAL_TABLET | Freq: Four times a day (QID) | ORAL | Status: DC | PRN

## 2023-01-23 MED ORDER — LACTATED RINGERS IV SOLN: INTRAVENOUS | Status: DC

## 2023-01-23 MED ORDER — OXYTOCIN-SODIUM CHLORIDE 30-0.9 UT/500ML-% IV SOLN
1.0000 m[IU]/min | INTRAVENOUS | Status: DC
  Administered 2023-01-23: 10 m[IU]/min via INTRAVENOUS
  Administered 2023-01-23: 8 m[IU]/min via INTRAVENOUS
  Administered 2023-01-23: 2 m[IU]/min via INTRAVENOUS
  Administered 2023-01-24: 12 m[IU]/min via INTRAVENOUS
  Administered 2023-01-24: 2 m[IU]/min via INTRAVENOUS
  Administered 2023-01-24: 18 m[IU]/min via INTRAVENOUS
  Administered 2023-01-24: 20 m[IU]/min via INTRAVENOUS
  Administered 2023-01-24: 14 m[IU]/min via INTRAVENOUS
  Filled 2023-01-23: qty 500

## 2023-01-23 MED ORDER — ZOLPIDEM TARTRATE 5 MG PO TABS: 5.0000 mg | ORAL_TABLET | Freq: Every evening | ORAL | Status: DC | PRN

## 2023-01-23 MED ORDER — LACTATED RINGERS IV SOLN: 500.0000 mL | INTRAVENOUS | Status: DC | PRN

## 2023-01-23 MED ORDER — LACTATED RINGERS IV SOLN: 500.0000 mL | Freq: Once | INTRAVENOUS | Status: DC

## 2023-01-23 MED ORDER — LIDOCAINE HCL (PF) 1 % IJ SOLN: 30.0000 mL | INTRAMUSCULAR | Status: DC | PRN

## 2023-01-23 MED ORDER — ACETAMINOPHEN 325 MG PO TABS: 650.0000 mg | ORAL_TABLET | ORAL | Status: DC | PRN

## 2023-01-23 MED ORDER — OXYCODONE-ACETAMINOPHEN 5-325 MG PO TABS: 2.0000 | ORAL_TABLET | ORAL | Status: DC | PRN

## 2023-01-23 MED ORDER — DIPHENHYDRAMINE HCL 50 MG/ML IJ SOLN
12.5000 mg | INTRAMUSCULAR | Status: DC | PRN
  Administered 2023-01-23: 12.5 mg via INTRAVENOUS
  Filled 2023-01-23: qty 1

## 2023-01-23 MED ORDER — EPHEDRINE 5 MG/ML INJ: 10.0000 mg | INTRAVENOUS | Status: DC | PRN

## 2023-01-23 MED ORDER — OXYCODONE-ACETAMINOPHEN 5-325 MG PO TABS: 1.0000 | ORAL_TABLET | ORAL | Status: DC | PRN

## 2023-01-23 MED ORDER — FENTANYL CITRATE (PF) 100 MCG/2ML IJ SOLN
50.0000 ug | INTRAMUSCULAR | Status: DC | PRN
  Administered 2023-01-23: 100 ug via INTRAVENOUS
  Filled 2023-01-23: qty 2

## 2023-01-23 MED ORDER — MISOPROSTOL 50MCG HALF TABLET
50.0000 ug | ORAL_TABLET | ORAL | Status: DC | PRN
  Administered 2023-01-23: 50 ug via ORAL
  Filled 2023-01-23: qty 1

## 2023-01-23 MED ORDER — LACTATED RINGERS IV SOLN
500.0000 mL | Freq: Once | INTRAVENOUS | Status: AC
  Administered 2023-01-23: 500 mL via INTRAVENOUS

## 2023-01-23 MED ORDER — OXYTOCIN BOLUS FROM INFUSION: 333.0000 mL | Freq: Once | INTRAVENOUS | Status: DC

## 2023-01-23 MED ORDER — OXYTOCIN-SODIUM CHLORIDE 30-0.9 UT/500ML-% IV SOLN: 2.5000 [IU]/h | INTRAVENOUS | Status: DC

## 2023-01-23 MED ORDER — LEVONORGESTREL 20 MCG/DAY IU IUD: 1.0000 | INTRAUTERINE_SYSTEM | Freq: Once | INTRAUTERINE | Status: DC

## 2023-01-23 MED ORDER — SOD CITRATE-CITRIC ACID 500-334 MG/5ML PO SOLN: 30.0000 mL | ORAL | Status: DC | PRN

## 2023-01-23 NOTE — Anesthesia Preprocedure Evaluation (Signed)
Anesthesia Evaluation  Patient identified by MRN, date of birth, ID band Patient awake    Reviewed: Allergy & Precautions, Patient's Chart, lab work & pertinent test results  Airway Mallampati: II  TM Distance: >3 FB     Dental  (+) Dental Advisory Given   Pulmonary asthma , Current Smoker and Patient abstained from smoking.   breath sounds clear to auscultation       Cardiovascular negative cardio ROS  Rhythm:Regular Rate:Normal     Neuro/Psych    GI/Hepatic Neg liver ROS,GERD  ,,  Endo/Other  diabetes, Type 2    Renal/GU negative Renal ROS     Musculoskeletal   Abdominal   Peds  Hematology  (+) Blood dyscrasia, anemia   Anesthesia Other Findings   Reproductive/Obstetrics (+) Pregnancy                             Anesthesia Physical Anesthesia Plan  ASA: 2  Anesthesia Plan: Epidural   Post-op Pain Management:    Induction:   PONV Risk Score and Plan: 1 and Treatment may vary due to age or medical condition  Airway Management Planned: Natural Airway  Additional Equipment:   Intra-op Plan:   Post-operative Plan:   Informed Consent: I have reviewed the patients History and Physical, chart, labs and discussed the procedure including the risks, benefits and alternatives for the proposed anesthesia with the patient or authorized representative who has indicated his/her understanding and acceptance.       Plan Discussed with:   Anesthesia Plan Comments:        Anesthesia Quick Evaluation
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
## 2023-01-23 NOTE — Anesthesia Procedure Notes (Signed)
Epidural Patient location during procedure: OB Start time: 01/23/2023 4:45 PM End time: 01/23/2023 4:52 PM  Staffing Anesthesiologist: Marcene Duos, MD Performed: anesthesiologist   Preanesthetic Checklist Completed: patient identified, IV checked, site marked, risks and benefits discussed, surgical consent, monitors and equipment checked, pre-op evaluation and timeout performed  Epidural Patient position: sitting Prep: DuraPrep and site prepped and draped Patient monitoring: continuous pulse ox and blood pressure Approach: midline Location: L4-L5 Injection technique: LOR air  Needle:  Needle type: Tuohy  Needle gauge: 17 G Needle length: 9 cm and 9 Needle insertion depth: 5.5 cm Catheter type: closed end flexible Catheter size: 19 Gauge Catheter at skin depth: 11 cm Test dose: negative  Assessment Events: blood not aspirated, no cerebrospinal fluid, injection not painful, no injection resistance, no paresthesia and negative IV test

## 2023-01-23 NOTE — Progress Notes (Addendum)
Labor Progress Note  Beth Willis is a 27 y.o. G2P1001 at [redacted]w[redacted]d presented for IOL A1GDM  S: pt doing well, has epidural. Difficulty tracing contractions  O:  BP 122/77   Pulse 72   Temp 97.7 F (36.5 C) (Oral)   Resp 17   Ht 5\' 7"  (1.702 m)   Wt 105.4 kg   LMP 05/09/2022 (Exact Date) Comment: approx 2 months pregnant  BMI 36.38 kg/m  EFM:135 bpm/Moderate variability/ 15x15 accels/ None decels CAT: 1 Toco: regular, every 2-4 minutes   CVE: Dilation: 6.5 Effacement (%): 80 Cervical Position: Posterior Station: -3 Presentation: Vertex Exam by:: Marquita C.   A&P: 27 y.o. G2P1001 100w0d  here for IOL as above  #Labor: Progressing well. No hand noted on SVE, IUPC placed, continue pitocin #Pain: Family/Friend support and Epidural #FWB: CAT 1 #GBS  pending #A1GDM: CBG 86, CBG q2h in active labor  Myrtie Hawk, DO FMOB Fellow, Faculty practice [facility], Center for [facility] 01/23/23  11:12 PM

## 2023-01-23 NOTE — Progress Notes (Signed)
LABOR PROGRESS NOTE  Kechia Groulx is a 27 y.o. G2P1001 at [redacted]w[redacted]d  admitted for IOL due to uncontrolled GDM  Subjective: Resting comfortably with epidural  Objective: BP 116/77   Pulse 76   Temp 98 F (36.7 C) (Oral)   Resp 16   Ht 5\' 7"  (1.702 m)   Wt 105.4 kg   LMP 05/09/2022 (Exact Date) Comment: approx 2 months pregnant  BMI 36.38 kg/m  or  Vitals:   01/23/23 1731 01/23/23 1746 01/23/23 1751 01/23/23 1752  BP: 113/76 104/69 116/77 116/77  Pulse: 76 84 76 76  Resp:  16 16 16   Temp:      TempSrc:      Weight:      Height:        FHT: baseline rate 145, moderate varibility, +accels, no decel Toco: q2-89min SVE: 4/60/-2, compound presentation- fetal hand noted along right side of cervix next to fetal head  Labs: Lab Results  Component Value Date   WBC 10.6 (H) 01/23/2023   HGB 10.2 (L) 01/23/2023   HCT 31.1 (L) 01/23/2023   MCV 85.2 01/23/2023   PLT 203 01/23/2023     Assessment / Plan: 27 y.o. G2P1001 at [redacted]w[redacted]d here for IOL due to uncontrolled GDM  Labor: plan to start Pit per protocol Fetal Wellbeing:  Cat. I Pain Control:  epidural in place GBS status: pending  Discussed compound presentation with mom and family- typically hand can be reduced with further dilation/descent of fetal head.  Will continue to closely monitor  Myna Hidalgo, DO Attending Obstetrician & Gynecologist, [facility] for [facility], [facility]

## 2023-01-24 ENCOUNTER — Encounter (HOSPITAL_COMMUNITY): Payer: Self-pay | Admitting: Obstetrics & Gynecology

## 2023-01-24 ENCOUNTER — Other Ambulatory Visit: Payer: Self-pay

## 2023-01-24 ENCOUNTER — Encounter (HOSPITAL_COMMUNITY)
Admission: AD | Disposition: A | Discharge status: Home / Self Care | Payer: Self-pay | Source: Home / Self Care | Attending: Family Medicine

## 2023-01-24 DIAGNOSIS — Z3A37 37 weeks gestation of pregnancy: Secondary | ICD-10-CM

## 2023-01-24 DIAGNOSIS — Z30430 Encounter for insertion of intrauterine contraceptive device: Secondary | ICD-10-CM

## 2023-01-24 DIAGNOSIS — Z975 Presence of (intrauterine) contraceptive device: Secondary | ICD-10-CM

## 2023-01-24 DIAGNOSIS — Z98891 History of uterine scar from previous surgery: Secondary | ICD-10-CM

## 2023-01-24 DIAGNOSIS — O3663X0 Maternal care for excessive fetal growth, third trimester, not applicable or unspecified: Secondary | ICD-10-CM

## 2023-01-24 DIAGNOSIS — O24420 Gestational diabetes mellitus in childbirth, diet controlled: Secondary | ICD-10-CM

## 2023-01-24 HISTORY — PX: INTRAUTERINE DEVICE (IUD) INSERTION: SHX5877

## 2023-01-24 LAB — GLUCOSE, CAPILLARY
Glucose-Capillary: 103 mg/dL — ABNORMAL HIGH (ref 70–99)
Glucose-Capillary: 112 mg/dL — ABNORMAL HIGH (ref 70–99)
Glucose-Capillary: 118 mg/dL — ABNORMAL HIGH (ref 70–99)
Glucose-Capillary: 80 mg/dL (ref 70–99)
Glucose-Capillary: 82 mg/dL (ref 70–99)
Glucose-Capillary: 83 mg/dL (ref 70–99)
Glucose-Capillary: 88 mg/dL (ref 70–99)

## 2023-01-24 SURGERY — Surgical Case
Anesthesia: Epidural

## 2023-01-24 MED ORDER — SODIUM CHLORIDE 0.9 % IR SOLN
Status: DC | PRN
  Administered 2023-01-24: 1

## 2023-01-24 MED ORDER — MEPERIDINE HCL 25 MG/ML IJ SOLN: 6.2500 mg | INTRAMUSCULAR | Status: DC | PRN

## 2023-01-24 MED ORDER — DIPHENHYDRAMINE HCL 50 MG/ML IJ SOLN
12.5000 mg | INTRAMUSCULAR | Status: DC | PRN
  Administered 2023-01-24: 12.5 mg via INTRAVENOUS
  Filled 2023-01-24: qty 1

## 2023-01-24 MED ORDER — GABAPENTIN 100 MG PO CAPS
200.0000 mg | ORAL_CAPSULE | Freq: Every day | ORAL | Status: DC
  Administered 2023-01-24 – 2023-01-25 (×2): 200 mg via ORAL
  Filled 2023-01-24 (×2): qty 2

## 2023-01-24 MED ORDER — CEFAZOLIN SODIUM-DEXTROSE 2-4 GM/100ML-% IV SOLN
2.0000 g | INTRAVENOUS | Status: AC
  Administered 2023-01-24: 2 g via INTRAVENOUS

## 2023-01-24 MED ORDER — LEVONORGESTREL 20 MCG/DAY IU IUD: 1.0000 | INTRAUTERINE_SYSTEM | Freq: Once | INTRAUTERINE | Status: DC

## 2023-01-24 MED ORDER — DEXMEDETOMIDINE HCL IN NACL 80 MCG/20ML IV SOLN
INTRAVENOUS | Status: DC | PRN
Start: 2023-01-24 — End: 2023-01-24
  Administered 2023-01-24: 8 ug via INTRAVENOUS

## 2023-01-24 MED ORDER — DROPERIDOL 2.5 MG/ML IJ SOLN
0.6250 mg | Freq: Once | INTRAMUSCULAR | Status: AC
  Administered 2023-01-24: 0.625 mg via INTRAVENOUS

## 2023-01-24 MED ORDER — METHYLERGONOVINE MALEATE 0.2 MG/ML IJ SOLN
INTRAMUSCULAR | Status: AC
  Filled 2023-01-24: qty 1

## 2023-01-24 MED ORDER — OXYTOCIN-SODIUM CHLORIDE 30-0.9 UT/500ML-% IV SOLN
INTRAVENOUS | Status: DC | PRN
  Administered 2023-01-24: 300 mL via INTRAVENOUS

## 2023-01-24 MED ORDER — AMISULPRIDE (ANTIEMETIC) 5 MG/2ML IV SOLN: 10.0000 mg | Freq: Once | INTRAVENOUS | Status: DC | PRN

## 2023-01-24 MED ORDER — ACETAMINOPHEN 10 MG/ML IV SOLN
INTRAVENOUS | Status: DC | PRN
Start: 2023-01-24 — End: 2023-01-24
  Administered 2023-01-24: 1000 mg via INTRAVENOUS

## 2023-01-24 MED ORDER — KETOROLAC TROMETHAMINE 30 MG/ML IJ SOLN
30.0000 mg | Freq: Four times a day (QID) | INTRAMUSCULAR | Status: AC
  Administered 2023-01-25 (×3): 30 mg via INTRAVENOUS
  Filled 2023-01-24 (×3): qty 1

## 2023-01-24 MED ORDER — NALOXONE HCL 0.4 MG/ML IJ SOLN: 0.4000 mg | INTRAMUSCULAR | Status: DC | PRN

## 2023-01-24 MED ORDER — SODIUM BICARBONATE 8.4 % IV SOLN
INTRAVENOUS | Status: AC
  Filled 2023-01-24: qty 50

## 2023-01-24 MED ORDER — METHYLERGONOVINE MALEATE 0.2 MG/ML IJ SOLN
INTRAMUSCULAR | Status: DC | PRN
  Administered 2023-01-24: .2 mg via INTRAMUSCULAR

## 2023-01-24 MED ORDER — OXYCODONE HCL 5 MG PO TABS: 5.0000 mg | ORAL_TABLET | Freq: Once | ORAL | Status: DC | PRN

## 2023-01-24 MED ORDER — CEFAZOLIN SODIUM-DEXTROSE 2-4 GM/100ML-% IV SOLN
INTRAVENOUS | Status: AC
  Filled 2023-01-24: qty 100

## 2023-01-24 MED ORDER — PHENYLEPHRINE 80 MCG/ML (10ML) SYRINGE FOR IV PUSH (FOR BLOOD PRESSURE SUPPORT)
PREFILLED_SYRINGE | INTRAVENOUS | Status: DC | PRN
  Administered 2023-01-24: 160 ug via INTRAVENOUS
  Administered 2023-01-24: 80 ug via INTRAVENOUS

## 2023-01-24 MED ORDER — STERILE WATER FOR IRRIGATION IR SOLN
Status: DC | PRN
  Administered 2023-01-24: 1000 mL

## 2023-01-24 MED ORDER — ACETAMINOPHEN 500 MG PO TABS
1000.0000 mg | ORAL_TABLET | Freq: Four times a day (QID) | ORAL | Status: DC
  Administered 2023-01-25 – 2023-01-26 (×5): 1000 mg via ORAL
  Filled 2023-01-24 (×6): qty 2

## 2023-01-24 MED ORDER — DIPHENHYDRAMINE HCL 25 MG PO CAPS
25.0000 mg | ORAL_CAPSULE | Freq: Four times a day (QID) | ORAL | Status: DC | PRN
  Administered 2023-01-25: 25 mg via ORAL
  Filled 2023-01-24: qty 1

## 2023-01-24 MED ORDER — SENNOSIDES-DOCUSATE SODIUM 8.6-50 MG PO TABS
2.0000 | ORAL_TABLET | Freq: Every day | ORAL | Status: DC
  Administered 2023-01-25 – 2023-01-26 (×2): 2 via ORAL
  Filled 2023-01-24 (×2): qty 2

## 2023-01-24 MED ORDER — LIDOCAINE-EPINEPHRINE (PF) 2 %-1:200000 IJ SOLN
INTRAMUSCULAR | Status: DC | PRN
  Administered 2023-01-24: 2 mL via EPIDURAL
  Administered 2023-01-24: 5 mL via EPIDURAL
  Administered 2023-01-24: 10 mL via EPIDURAL

## 2023-01-24 MED ORDER — DROPERIDOL 2.5 MG/ML IJ SOLN
INTRAMUSCULAR | Status: AC
  Filled 2023-01-24: qty 2

## 2023-01-24 MED ORDER — SCOPOLAMINE 1 MG/3DAYS TD PT72
1.0000 | MEDICATED_PATCH | Freq: Once | TRANSDERMAL | Status: DC
  Administered 2023-01-24: 1.5 mg via TRANSDERMAL

## 2023-01-24 MED ORDER — SODIUM CHLORIDE 0.9% FLUSH: 3.0000 mL | INTRAVENOUS | Status: DC | PRN

## 2023-01-24 MED ORDER — ENOXAPARIN SODIUM 60 MG/0.6ML IJ SOSY
50.0000 mg | PREFILLED_SYRINGE | INTRAMUSCULAR | Status: DC
  Administered 2023-01-25 – 2023-01-26 (×2): 50 mg via SUBCUTANEOUS
  Filled 2023-01-24 (×2): qty 0.6

## 2023-01-24 MED ORDER — KETOROLAC TROMETHAMINE 30 MG/ML IJ SOLN
INTRAMUSCULAR | Status: AC
  Filled 2023-01-24: qty 1

## 2023-01-24 MED ORDER — TRANEXAMIC ACID-NACL 1000-0.7 MG/100ML-% IV SOLN: 1000.0000 mg | Freq: Once | INTRAVENOUS | Status: DC

## 2023-01-24 MED ORDER — KETOROLAC TROMETHAMINE 30 MG/ML IJ SOLN: 30.0000 mg | Freq: Four times a day (QID) | INTRAMUSCULAR | Status: DC | PRN

## 2023-01-24 MED ORDER — FENTANYL CITRATE (PF) 100 MCG/2ML IJ SOLN
INTRAMUSCULAR | Status: AC
  Filled 2023-01-24: qty 2

## 2023-01-24 MED ORDER — DIPHENHYDRAMINE HCL 25 MG PO CAPS: 25.0000 mg | ORAL_CAPSULE | ORAL | Status: DC | PRN

## 2023-01-24 MED ORDER — SODIUM CHLORIDE 0.9 % IV SOLN
500.0000 mg | INTRAVENOUS | Status: AC
  Administered 2023-01-24: 500 mg via INTRAVENOUS

## 2023-01-24 MED ORDER — KETOROLAC TROMETHAMINE 30 MG/ML IJ SOLN
30.0000 mg | Freq: Once | INTRAMUSCULAR | Status: AC | PRN
  Administered 2023-01-24: 30 mg via INTRAVENOUS

## 2023-01-24 MED ORDER — DEXAMETHASONE SODIUM PHOSPHATE 4 MG/ML IJ SOLN
INTRAMUSCULAR | Status: AC
  Filled 2023-01-24: qty 1

## 2023-01-24 MED ORDER — MEDROXYPROGESTERONE ACETATE 150 MG/ML IM SUSP: 150.0000 mg | INTRAMUSCULAR | Status: DC | PRN

## 2023-01-24 MED ORDER — WITCH HAZEL-GLYCERIN EX PADS: 1.0000 | MEDICATED_PAD | CUTANEOUS | Status: DC | PRN

## 2023-01-24 MED ORDER — IBUPROFEN 600 MG PO TABS
600.0000 mg | ORAL_TABLET | Freq: Four times a day (QID) | ORAL | Status: DC
  Administered 2023-01-25 – 2023-01-26 (×3): 600 mg via ORAL
  Filled 2023-01-24 (×3): qty 1

## 2023-01-24 MED ORDER — TRANEXAMIC ACID-NACL 1000-0.7 MG/100ML-% IV SOLN
INTRAVENOUS | Status: DC | PRN
Start: 2023-01-24 — End: 2023-01-24
  Administered 2023-01-24: 1000 mg via INTRAVENOUS

## 2023-01-24 MED ORDER — MORPHINE SULFATE (PF) 0.5 MG/ML IJ SOLN
INTRAMUSCULAR | Status: DC | PRN
  Administered 2023-01-24: 3 mg via EPIDURAL

## 2023-01-24 MED ORDER — SODIUM BICARBONATE 8.4 % IV SOLN: INTRAVENOUS | Status: DC | PRN

## 2023-01-24 MED ORDER — ONDANSETRON HCL 4 MG/2ML IJ SOLN: 4.0000 mg | Freq: Three times a day (TID) | INTRAMUSCULAR | Status: DC | PRN

## 2023-01-24 MED ORDER — OXYCODONE HCL 5 MG PO TABS
5.0000 mg | ORAL_TABLET | ORAL | Status: DC | PRN
  Administered 2023-01-25 – 2023-01-26 (×2): 5 mg via ORAL
  Administered 2023-01-26: 10 mg via ORAL
  Filled 2023-01-24: qty 1
  Filled 2023-01-24: qty 2
  Filled 2023-01-24: qty 1

## 2023-01-24 MED ORDER — ACETAMINOPHEN 500 MG PO TABS: 1000.0000 mg | ORAL_TABLET | Freq: Four times a day (QID) | ORAL | Status: DC

## 2023-01-24 MED ORDER — LIDOCAINE-EPINEPHRINE (PF) 2 %-1:200000 IJ SOLN
INTRAMUSCULAR | Status: AC
  Filled 2023-01-24: qty 20

## 2023-01-24 MED ORDER — NALOXONE HCL 4 MG/10ML IJ SOLN: 1.0000 ug/kg/h | INTRAVENOUS | Status: DC | PRN

## 2023-01-24 MED ORDER — SIMETHICONE 80 MG PO CHEW: 80.0000 mg | CHEWABLE_TABLET | ORAL | Status: DC | PRN

## 2023-01-24 MED ORDER — COCONUT OIL OIL: 1.0000 | TOPICAL_OIL | Status: DC | PRN

## 2023-01-24 MED ORDER — SOD CITRATE-CITRIC ACID 500-334 MG/5ML PO SOLN
30.0000 mL | ORAL | Status: AC
  Administered 2023-01-24: 30 mL via ORAL

## 2023-01-24 MED ORDER — ONDANSETRON HCL 4 MG/2ML IJ SOLN
INTRAMUSCULAR | Status: DC | PRN
  Administered 2023-01-24: 4 mg via INTRAVENOUS

## 2023-01-24 MED ORDER — ZOLPIDEM TARTRATE 5 MG PO TABS: 5.0000 mg | ORAL_TABLET | Freq: Every evening | ORAL | Status: DC | PRN

## 2023-01-24 MED ORDER — DEXAMETHASONE SODIUM PHOSPHATE 10 MG/ML IJ SOLN
INTRAMUSCULAR | Status: DC | PRN
  Administered 2023-01-24: 4 mg via INTRAVENOUS

## 2023-01-24 MED ORDER — OXYCODONE HCL 5 MG/5ML PO SOLN: 5.0000 mg | Freq: Once | ORAL | Status: DC | PRN

## 2023-01-24 MED ORDER — LEVONORGESTREL 20 MCG/DAY IU IUD
INTRAUTERINE_SYSTEM | INTRAUTERINE | Status: AC
  Filled 2023-01-24: qty 1

## 2023-01-24 MED ORDER — PRENATAL MULTIVITAMIN CH
1.0000 | ORAL_TABLET | Freq: Every day | ORAL | Status: DC
  Administered 2023-01-25: 1 via ORAL
  Filled 2023-01-24: qty 1

## 2023-01-24 MED ORDER — FENTANYL CITRATE (PF) 100 MCG/2ML IJ SOLN
INTRAMUSCULAR | Status: DC | PRN
  Administered 2023-01-24: 100 ug via EPIDURAL

## 2023-01-24 MED ORDER — HYDROMORPHONE HCL 1 MG/ML IJ SOLN: 0.2500 mg | INTRAMUSCULAR | Status: DC | PRN

## 2023-01-24 MED ORDER — MORPHINE SULFATE (PF) 0.5 MG/ML IJ SOLN
INTRAMUSCULAR | Status: AC
  Filled 2023-01-24: qty 10

## 2023-01-24 MED ORDER — MENTHOL 3 MG MT LOZG: 1.0000 | LOZENGE | OROMUCOSAL | Status: DC | PRN

## 2023-01-24 MED ORDER — OXYTOCIN-SODIUM CHLORIDE 30-0.9 UT/500ML-% IV SOLN
2.5000 [IU]/h | INTRAVENOUS | Status: AC
  Administered 2023-01-24: 2.5 [IU]/h via INTRAVENOUS
  Filled 2023-01-24: qty 500

## 2023-01-24 MED ORDER — ONDANSETRON HCL 4 MG/2ML IJ SOLN
INTRAMUSCULAR | Status: AC
  Filled 2023-01-24: qty 2

## 2023-01-24 MED ORDER — TETANUS-DIPHTH-ACELL PERTUSSIS 5-2.5-18.5 LF-MCG/0.5 IM SUSY: 0.5000 mL | PREFILLED_SYRINGE | Freq: Once | INTRAMUSCULAR | Status: DC

## 2023-01-24 MED ORDER — DIBUCAINE (PERIANAL) 1 % EX OINT: 1.0000 | TOPICAL_OINTMENT | CUTANEOUS | Status: DC | PRN

## 2023-01-24 MED ORDER — ONDANSETRON HCL 4 MG/2ML IJ SOLN: 4.0000 mg | Freq: Once | INTRAMUSCULAR | Status: DC | PRN

## 2023-01-24 MED ORDER — SIMETHICONE 80 MG PO CHEW
80.0000 mg | CHEWABLE_TABLET | Freq: Three times a day (TID) | ORAL | Status: DC
  Administered 2023-01-25 – 2023-01-26 (×4): 80 mg via ORAL
  Filled 2023-01-24 (×4): qty 1

## 2023-01-24 MED ORDER — SCOPOLAMINE 1 MG/3DAYS TD PT72
MEDICATED_PATCH | TRANSDERMAL | Status: AC
  Filled 2023-01-24: qty 1

## 2023-01-24 SURGICAL SUPPLY — 35 items
APL PRP STRL LF DISP 70% ISPRP (MISCELLANEOUS) ×4
CHLORAPREP W/TINT 26 (MISCELLANEOUS) ×4 IMPLANT
CLAMP UMBILICAL CORD (MISCELLANEOUS) ×2 IMPLANT
CLOTH BEACON ORANGE TIMEOUT ST (SAFETY) ×2 IMPLANT
DRSG OPSITE POSTOP 4X10 (GAUZE/BANDAGES/DRESSINGS) ×2 IMPLANT
ELECT REM PT RETURN 9FT ADLT (ELECTROSURGICAL) ×2
ELECTRODE REM PT RTRN 9FT ADLT (ELECTROSURGICAL) ×2 IMPLANT
EXTRACTOR VACUUM KIWI (MISCELLANEOUS) IMPLANT
GAUZE SPONGE 4X4 12PLY STRL LF (GAUZE/BANDAGES/DRESSINGS) IMPLANT
GLOVE BIOGEL PI IND STRL 7.0 (GLOVE) ×4 IMPLANT
GLOVE ECLIPSE 7.0 STRL STRAW (GLOVE) ×2 IMPLANT
GOWN STRL REUS W/TWL LRG LVL3 (GOWN DISPOSABLE) ×4 IMPLANT
HEMOSTAT ARISTA ABSORB 3G PWDR (HEMOSTASIS) IMPLANT
KIT ABG SYR 3ML LUER SLIP (SYRINGE) ×2 IMPLANT
NDL HYPO 25X5/8 SAFETYGLIDE (NEEDLE) ×2 IMPLANT
NEEDLE HYPO 25X5/8 SAFETYGLIDE (NEEDLE) ×2 IMPLANT
NS IRRIG 1000ML POUR BTL (IV SOLUTION) ×2 IMPLANT
PACK C SECTION WH (CUSTOM PROCEDURE TRAY) ×2 IMPLANT
PAD ABD DERMACEA PRESS 5X9 (GAUZE/BANDAGES/DRESSINGS) IMPLANT
PAD OB MATERNITY 4.3X12.25 (PERSONAL CARE ITEMS) ×2 IMPLANT
RTRCTR C-SECT PINK 25CM LRG (MISCELLANEOUS) ×2 IMPLANT
SUT MNCRL 0 VIOLET CTX 36 (SUTURE) ×2 IMPLANT
SUT PLAIN 0 NONE (SUTURE) IMPLANT
SUT PLAIN 2 0 (SUTURE) ×2
SUT PLAIN 2 0 XLH (SUTURE) IMPLANT
SUT PLAIN ABS 2-0 CT1 27XMFL (SUTURE) IMPLANT
SUT VIC AB 0 CTX 36 (SUTURE) ×2
SUT VIC AB 0 CTX36XBRD ANBCTRL (SUTURE) ×2 IMPLANT
SUT VIC AB 2-0 CT1 (SUTURE) IMPLANT
SUT VIC AB 2-0 CT1 27 (SUTURE)
SUT VIC AB 2-0 CT1 TAPERPNT 27 (SUTURE) IMPLANT
SUT VIC AB 4-0 KS 27 (SUTURE) ×2 IMPLANT
TOWEL OR 17X24 6PK STRL BLUE (TOWEL DISPOSABLE) ×2 IMPLANT
TRAY FOLEY W/BAG SLVR 14FR LF (SET/KITS/TRAYS/PACK) IMPLANT
WATER STERILE IRR 1000ML POUR (IV SOLUTION) ×2 IMPLANT

## 2023-01-24 NOTE — Transfer of Care (Signed)
Immediate Anesthesia Transfer of Care Note  Patient: Beth Willis  Procedure(s) Performed: CESAREAN SECTION INTRAUTERINE DEVICE (IUD) INSERTION  Patient Location: PACU  Anesthesia Type:Epidural  Level of Consciousness: awake  Airway & Oxygen Therapy: Patient Spontanous Breathing  Post-op Assessment: Report given to RN  Post vital signs: Reviewed  Last Vitals:  Vitals Value Taken Time  BP 104/65 01/24/23 1755  Temp    Pulse 85 01/24/23 1759  Resp 18 01/24/23 1759  SpO2 99 % 01/24/23 1759  Vitals shown include unfiled device data.  Last Pain:  Vitals:   01/24/23 1531  TempSrc: Oral  PainSc:          Complications: No notable events documented.

## 2023-01-24 NOTE — Progress Notes (Signed)
Labor Progress Note  Meghaan Venditti is a 27 y.o. G2P1001 at [redacted]w[redacted]d presented for IOL A1GDM  S: Patient frustrated at lack of progress in her labor as compared to her last pregnancy  O:  BP 133/72   Pulse 88   Temp 98.3 F (36.8 C) (Oral)   Resp 17   Ht 5\' 7"  (1.702 m)   Wt 105.4 kg   LMP 05/09/2022 (Exact Date) Comment: approx 2 months pregnant  BMI 36.38 kg/m  EFM:135 bpm/Moderate variability/ 15x15 accels/ None decels CAT: 1 Toco: regular, every 2-4 minutes   CVE: Dilation: 7 Effacement (%): 80 Cervical Position: Posterior Station: -2 Presentation: Vertex Exam by:: Meriam Sprague RN   A&P: 27 y.o. G2P1001 [redacted]w[redacted]d  here for IOL as above  #Labor: Discussed options, to increase Pitocin steadily and continue maternal position changes or Pitocin now and do 4-hour washout.  Patient opted for for washout.  Restart Pitocin at 9:40 AM. #Pain: Family/Friend support and Epidural #FWB: CAT 1 #GBS  pending #A1GDM: CBG 86, CBG q2h in active labor  Myrtie Hawk, DO FMOB Fellow, Faculty practice [facility], Center for [facility] 01/24/23  5:45 AM

## 2023-01-24 NOTE — Progress Notes (Signed)
Labor Progress Note Beth Willis is a 27 y.o. G2P1001 at [redacted]w[redacted]d presented for IOL A1GDM. S: Patient is resting comfortably. Feeling more pressure, took a nap while taking pitocin break.  O:  BP 121/66   Pulse (!) 118   Temp 98 F (36.7 C) (Oral)   Resp 17   Ht 5\' 7"  (1.702 m)   Wt 105.4 kg   LMP 05/09/2022 (Exact Date) Comment: approx 2 months pregnant  BMI 36.38 kg/m  EFM: basline 145/moderate variability/+ accells, intermittent variable decel Toco: q4-6 minutes  CVE: Dilation: 7 Effacement (%): 80 Cervical Position: Posterior Station: -2 Presentation: Vertex Exam by:: M.Chelsye Suhre   A&P: 27 y.o. G2P1001 [redacted]w[redacted]d  presents for IOL for A1GDM. #Labor: SVE unchanged from prior. IUPC in place. Pitocin break since 0540 AM. Discussed with RN and Dr Crissie Reese, will restart pitocin 4x4 at 0940 to attempt to get back to adequate MVU with contractions. If no change in 4-6 hours will discuss with patient Cesarean section. #Pain: epidural, comfortable #FWB: overall reassuring with moderate variability and accels, occasional variable deceleration, continue to monitor closely #GBS negative  #A1GDM- most recent BG 112, checking q2hr in active labor.  Billey Co, MD Center for [facility], [facility] 9:23 AM

## 2023-01-24 NOTE — Op Note (Signed)
Operative Note   Patient: Beth Willis  Date of Procedure: 01/23/2023 - 01/24/2023  Procedure: Primary Low Transverse Cesarean and post-placental IUD insertion    Indications: failure to progress: arrest of dilation at 7 cm  Pre-operative Diagnosis: arrest of dilation and contraception.   Post-operative Diagnosis: Same and IUD insertion: Mirena  TOLAC Candidate: Yes   Surgeon: Surgeons and Role:    * Venora Maples, MD - Primary  Assistants: Dr. Burley Saver  An experienced assistant was required given the standard of surgical care given the complexity of the case.  This assistant was needed for exposure, dissection, suctioning, retraction, instrument exchange, assisting with delivery with administration of fundal pressure, and for overall help during the procedure.   Anesthesia: epidural  Anesthesiologist: Lannie Fields, DO   Antibiotics: Cefazolin and Azithromycin   Estimated Blood Loss: 639 ml   Total IV Fluids: 1550 ml  Urine Output:  300 cc OF clear urine  Specimens: placenta to pathology   Complications: no complications   Indications: Beth Willis is a 27 y.o. G2P1001 with an IUP [redacted]w[redacted]d presenting for unscheduled, urgent cesarean secondary to the indications listed above. Clinical course notable for IOL for GDMA1 with suspected macrosomia on prenatal [place]. She was induced with cytotec, foley balloon, AROM, and two rounds of pitocin. At decision time patient's cervix had been unchanged for >14 hours and ruptured for close to 20 hours.   The risks of cesarean section discussed with the patient included but were not limited to: bleeding which may require transfusion or reoperation; infection which may require antibiotics; injury to bowel, bladder, ureters or other surrounding organs; injury to the fetus; need for additional procedures including hysterectomy in the event of a life-threatening hemorrhage; placental abnormalities with subsequent  pregnancies, incisional problems, thromboembolic phenomenon and other postoperative/anesthesia complications. The patient concurred with the proposed plan, giving informed written consent for the procedure. Patient NPO status waived given urgency of case. Anesthesia and OR aware. Preoperative prophylactic antibiotics and SCDs ordered on call to the OR.   In addition patient desired post-placental IUD insertion.   Discussed risks of irregular bleeding, increased cramping, infection, malpositioning or misplacement of the IUD outside the uterus which may require further procedure such as laparoscopy. Also discussed >99% contraception efficacy, increased risk of ectopic pregnancy with failure of method.    Venora Maples, MD/MPH Attending Family Medicine Physician, [facility] for [facility], [facility]   Findings: Viable infant in cephalic, direct OP presentation, body cord x1. Apgars 9, 9, . Weight 3330 g. Clear amniotic fluid. Normal placenta, three vessel cord. Normal uterus, Normal bilateral fallopian tubes, Normal bilateral ovaries. No adhesive disease was encountered.  Procedure Details: A Time Out was held and the above information confirmed. The patient received intravenous antibiotics and had sequential compression devices applied to her lower extremities preoperatively. The patient was taken back to the operative suite where epidural anesthesia was administered. After induction of anesthesia, the patient was draped and prepped in the usual sterile manner and placed in a dorsal supine position with a leftward tilt. A low transverse skin incision was made with scalpel and carried down through the subcutaneous tissue to the fascia. Fascial incision was made and extended transversely. The fascia was separated from the underlying rectus tissue superiorly and inferiorly. The rectus muscles were separated in the midline bluntly and the peritoneum was entered bluntly.  An Alexis retractor was placed to aid in visualization of the uterus. A bladder  flap was not developed. A low transverse uterine incision was made. The infant was successfully delivered from cephalic, direct OP presentation, the umbilical cord was clamped after 1 minute. Cord ph was not sent, and cord blood was obtained for evaluation. The placenta was removed Intact and appeared normal.   A Mirena IUD was then removed from it's packaging in a sterile manner and the strings were trimmed to approximately 10 cm. The IUD was placed manually at the uterine fundus and the strings were passed through the cervical os with a Kelly clamp.   The uterine incision was closed with a single layer running locked suture of 0-Monocryl. Due to some mild oozing a layer of Arista was placed. Overall, excellent hemostasis was noted. The abdomen and the pelvis were cleared of all clot and debris and the Jon Gills was removed. Hemostasis was confirmed on all surfaces.  The peritoneum was reapproximated using 2-0 vicryl . The fascia was then closed using 0 Vicryl in a running fashion. The subcutaneous layer was reapproximated with 2-0 plain gut suture. The skin was closed with a 4-0 vicryl subcuticular stitch. The patient tolerated the procedure well. Sponge, lap, instrument and needle counts were correct x 2. She was taken to the recovery room in stable condition.  Disposition: PACU - hemodynamically stable.    Signed: Venora Maples, MD/MPH Attending Family Medicine Physician, [facility] for [facility], [facility]

## 2023-01-24 NOTE — Discharge Summary (Signed)
Postpartum Discharge Summary  Date of Service updated***     Patient Name: Beth Willis DOB: 1996-04-04 MRN: [id number]  Date of admission: 01/23/2023 Delivery date:01/24/2023 Delivering provider: Venora Maples Date of discharge: 01/24/2023  Admitting diagnosis: Poor glycemic control [R73.09] Intrauterine pregnancy: [redacted]w[redacted]d     Secondary diagnosis:  Principal Problem:   Poor glycemic control Active Problems:   S/P cesarean section   IUD (intrauterine device) in place  Additional problems: ***    Discharge diagnosis: Term Pregnancy Delivered and GDM A1                                              Post partum procedures:{Postpartum procedures:23558} Augmentation: AROM, Pitocin, and Cytotec Complications: {OB Labor/Delivery Complications:20784}  Hospital course: Induction of Labor With Cesarean Section   27 y.o. yo G2P1001 at [redacted]w[redacted]d was admitted to the hospital 01/23/2023 for induction of labor. Patient had a labor course significant for arrest of dilation at 7 cm, after rupture > 20 hours, 12 hours on pitocin, pitocin break, and another 6 hours on pitocin with inadequate contractions. The patient went for cesarean section due to Arrest of Dilation. Delivery details are as follows: Membrane Rupture Time/Date: 8:58 PM,01/23/2023  Delivery Method:C-Section, Low Transverse Operative Delivery:N/A Details of operation can be found in separate operative Note.  Patient had a postpartum course complicated by***. She is ambulating, tolerating a regular diet, passing flatus, and urinating well.  Patient is discharged home in stable condition on 01/24/23.      Newborn Data: Birth date:01/24/2023 Birth time:5:01 PM Gender:Female Living status:Living Apgars:9 ,9  Weight:3330 g                               Magnesium Sulfate received: No BMZ received: No Rhophylac:N/A MMR:N/A T-DaP:Given prenatally Flu: N/A Transfusion:{Transfusion received:30440034}  Physical exam  Vitals:    01/24/23 1431 01/24/23 1501 01/24/23 1531 01/24/23 1619  BP: 134/82 129/85 (!) 143/84 119/87  Pulse: (!) 109 97 94 (!) 104  Resp:   17   Temp: 98.3 F (36.8 C)  98.4 F (36.9 C)   TempSrc: Oral  Oral   Weight:      Height:       General: {Exam; general:21111117} Lochia: {Desc; appropriate/inappropriate:30686::"appropriate"} Uterine Fundus: {Desc; firm/soft:30687} Incision: {Exam; incision:21111123} DVT Evaluation: {Exam; dvt:2111122} Labs: Lab Results  Component Value Date   WBC 10.6 (H) 01/23/2023   HGB 10.2 (L) 01/23/2023   HCT 31.1 (L) 01/23/2023   MCV 85.2 01/23/2023   PLT 203 01/23/2023      Latest Ref Rng & Units 01/23/2023    8:08 AM  CMP  Glucose 70 - 99 mg/dL 99   BUN 6 - 20 mg/dL 7   Creatinine 9.56 - 2.13 mg/dL 0.86   Sodium 578 - 469 mmol/L 134   Potassium 3.5 - 5.1 mmol/L 3.5   Chloride 98 - 111 mmol/L 104   CO2 22 - 32 mmol/L 20   Calcium 8.9 - 10.3 mg/dL 9.0   Total Protein 6.5 - 8.1 g/dL 6.2   Total Bilirubin 0.3 - 1.2 mg/dL 0.3   Alkaline Phos 38 - 126 U/L 121   AST 15 - 41 U/L 18   ALT 0 - 44 U/L 11    Edinburgh Score:    07/05/2022  10:34 AM  Edinburgh Postnatal Depression Scale Screening Tool  I have been able to laugh and see the funny side of things. 2  I have looked forward with enjoyment to things. 0  I have blamed myself unnecessarily when things went wrong. 2  I have been anxious or worried for no good reason. 3  I have felt scared or panicky for no good reason. 0  Things have been getting on top of me. 3  I have been so unhappy that I have had difficulty sleeping. 0  I have felt sad or miserable. 0  I have been so unhappy that I have been crying. 0  The thought of harming myself has occurred to me. 0  Edinburgh Postnatal Depression Scale Total 10     After visit meds:  Allergies as of 01/24/2023       Reactions   Dust Mite Extract Shortness Of Breath, Swelling   Bee Pollen Other (See Comments)   Penicillins Hives   Did it  involve swelling of the face/tongue/throat, SOB, or low BP? Unknown Did it involve sudden or severe rash/hives, skin peeling, or any reaction on the inside of your mouth or nose? No Did you need to seek medical attention at a hospital or doctor's office? No When did it last happen?      Childhood allergy   Pollen Extract    Amoxicillin Hives   Did it involve swelling of the face/tongue/throat, SOB, or low BP? Unknown Did it involve sudden or severe rash/hives, skin peeling, or any reaction on the inside of your mouth or nose? No Did you need to seek medical attention at a hospital or doctor's office? No When did it last happen?      Childhood allergy If all above answers are "NO", may proceed with cephalosporin use.     Med Rec must be completed prior to using this [facility]***        Discharge home in stable condition Infant Feeding: {Baby feeding:23562} Infant Disposition:{CHL IP OB HOME WITH WGNFAO:13086} Discharge instruction: per After Visit Summary and Postpartum booklet. Activity: Advance as tolerated. Pelvic rest for 6 weeks.  Diet: carb modified diet Future Appointments:No future appointments. Follow up Visit:  Follow-up Information     [facility] for [facility] at [facility] Follow up.   Specialty: Obstetrics and Gynecology Contact information: [address] [place] [address] [phone]                 Please schedule this patient for a In person postpartum visit in 1 week with the following provider: MD. For incision check Additional Postpartum F/U: 6 wk PP visit   High risk pregnancy complicated by: GDM Delivery mode:  C-Section, Low Transverse Anticipated Birth Control:  PP IUD placed  Message sent to schedulers on 01/24/23.  01/24/2023 Billey Co, MD

## 2023-01-24 NOTE — Progress Notes (Signed)
Labor Progress Note Beth Willis is a 27 y.o. G2P1001 at [redacted]w[redacted]d presented for IOL for A1GDM. S: Patient is resting comfortably.   O:  BP (!) 143/84 (BP Location: Left Arm)   Pulse 94   Temp 98.4 F (36.9 C) (Oral)   Resp 17   Ht 5\' 7"  (1.702 m)   Wt 105.4 kg   LMP 05/09/2022 (Exact Date) Comment: approx 2 months pregnant  BMI 36.38 kg/m  EFM: baseline 135 /moderate variability/+ accels, occasional variable decel Toco: q2-4 minutes, MVU ~ 140  CVE: Dilation: 7 Effacement (%): 80 Cervical Position: Posterior Station: -2 Presentation: Vertex Exam by:: M.Saraia Platner   A&P: 27 y.o. G2P1001 [redacted]w[redacted]d  presents for IOL for A1GDM.  #Labor: SVE unchanged after 6 hours on pitocin after pit break. She has been at about 7 cm for > 14 hours, with AROM 2100 yesterday, and pitocin for about 12 hours prior to pitocin break. Discussed meeting criteria for arrest of dilation and our recommendation to proceed with Cesarean delivery.   The risks of cesarean section discussed with the patient included but were not limited to: bleeding which may require transfusion or reoperation; infection which may require antibiotics; injury to bowel, bladder, ureters or other surrounding organs; injury to the fetus; need for additional procedures including hysterectomy in the event of a life-threatening hemorrhage; placental abnormalities with subsequent pregnancies, incisional problems, thromboembolic phenomenon and other postoperative/anesthesia complications. The patient concurred with the proposed plan, giving informed written consent for the procedure. Patient NPO status waived given urgency of case. Dr Crissie Reese to bedside to discuss with patient. Anesthesia and OR aware. Discussed her post-placental IUD as well.   Pitocin stopped, preparing for OR.   #Pain: epidural, comfortable #FWB: overall reassuring with moderate variability, + accels, continue to monitor closely #GBS negative  Billey Co, MD Center for  [facility], [facility] 4:09 PM

## 2023-01-24 NOTE — Progress Notes (Signed)
To bedside to discuss plan of care with patient.   At this point patient has been unchanged at 7 cm for >14 hours and had AROM yesterday at 2100, approximately 20 hours ago. Had pitocin for ~12 hours until 0500 this morning, resumed this morning and after approximately 6hr with inadequate contractions she remained unchanged. At this point meets criteria for arrest of dilation, and I recommended proceeding with delivery by cesarean. Patient was in agreement with this plan.   The risks of cesarean section discussed with the patient included but were not limited to: bleeding which may require transfusion or reoperation; infection which may require antibiotics; injury to bowel, bladder, ureters or other surrounding organs; injury to the fetus; need for additional procedures including hysterectomy in the event of a life-threatening hemorrhage; placental abnormalities with subsequent pregnancies, incisional problems, thromboembolic phenomenon and other postoperative/anesthesia complications. The patient concurred with the proposed plan, giving informed written consent for the procedure. Patient NPO status waived given urgency of case. Anesthesia and OR aware. Preoperative prophylactic antibiotics and SCDs ordered on call to the OR.   In addition patient desired post-placental hormonal IUD placement. Discussed risks of irregular bleeding, increased cramping, infection, malpositioning or misplacement of the IUD outside the uterus which may require further procedure such as laparoscopy. Also discussed >99% contraception efficacy, increased risk of ectopic pregnancy with failure of method.   Venora Maples, MD/MPH Attending Family Medicine Physician, [facility] for [facility], [facility]

## 2023-01-25 ENCOUNTER — Encounter (HOSPITAL_COMMUNITY): Payer: Self-pay | Admitting: Family Medicine

## 2023-01-25 DIAGNOSIS — D62 Acute posthemorrhagic anemia: Secondary | ICD-10-CM | POA: Insufficient documentation

## 2023-01-25 LAB — GLUCOSE, CAPILLARY: Glucose-Capillary: 137 mg/dL — ABNORMAL HIGH (ref 70–99)

## 2023-01-25 MED ORDER — FERROUS SULFATE 325 (65 FE) MG PO TABS
325.0000 mg | ORAL_TABLET | ORAL | Status: DC
  Administered 2023-01-25: 325 mg via ORAL
  Filled 2023-01-25: qty 1

## 2023-01-25 NOTE — Lactation Note (Signed)
This note was copied from a baby's chart. Lactation Consultation Note Mom wants to supplement to make sure the baby gets food. Also mom had GDM and mom wants to make sure he is getting something to keep his glucose up. Mom asked about pumping. LC suggested mom BF then pump and give colostrum. If mom doesn't collect colostrum she wants to give formula. Explained to mom that she has a good amount of colostrum in her breast. After 24 hrs and the baby's glucose is good, mom may not have to pump and supplement. Baby will probably get what he needs from the breast. Have to try that and see how he does. Tried to explain this to mom. LC set up DEBP. Mom shown how to use DEBP & how to disassemble, clean, & reassemble parts. Mom asked if LC can show her how to use her personal pumps that she has 2. LC apologized informing mom LC wasn't allowed to do that.  Hand expression taught w/colostrum noted. Praised mom. Newborn feeding habits, STS, I&O, positions, support, milk storage reviewed. Mom encouraged to feed baby 8-12 times/24 hours and with feeding cues.  Mom is going to eat her supper, then use DEBP and MGM is going to give the baby formula. Mom stated he has all ready been to the breast recently. Baby is sleeping. Answered mom's questions. Encouraged to call for assistance as needed.  Patient Name: Beth Willis [id number] Date: 01/25/2023 Age:74 hours Reason for consult: Initial assessment;Early term 37-38.6wks;Maternal endocrine disorder   Maternal Data Has patient been taught Hand Expression?: Yes Does the patient have breastfeeding experience prior to this delivery?: Yes How long did the patient breastfeed?: 2 days  Feeding Nipple Type: Slow - flow  LATCH Score       Type of Nipple: Everted at rest and after stimulation  Comfort (Breast/Nipple): Soft / non-tender         Lactation Tools Discussed/Used Tools: Pump;Flanges Flange Size: 21 Breast pump type: Double-Electric Breast  Pump Pump Education: Setup, frequency, and cleaning;Milk Storage Reason for Pumping: mom wants to supplement Pumping frequency: q 3hr  Interventions Interventions: Breast feeding basics reviewed;DEBP;Position options;Education;Breast massage;Hand express;LC Services brochure;Breast compression  Discharge    Consult Status Consult Status: Follow-up Date: 01/25/23 Follow-up type: In-patient    Charyl Dancer 01/25/2023, 12:41 AM

## 2023-01-25 NOTE — Anesthesia Postprocedure Evaluation (Signed)
Anesthesia Post Note  Patient: Beth Willis  Procedure(s) Performed: CESAREAN SECTION INTRAUTERINE DEVICE (IUD) INSERTION     Patient location during evaluation: PACU Anesthesia Type: Epidural Level of consciousness: awake and alert and oriented Pain management: pain level controlled Vital Signs Assessment: post-procedure vital signs reviewed and stable Respiratory status: spontaneous breathing, nonlabored ventilation and respiratory function stable Cardiovascular status: blood pressure returned to baseline and stable Postop Assessment: no headache, no backache, patient able to bend at knees, epidural receding and no apparent nausea or vomiting Anesthetic complications: no   No notable events documented.  Last Vitals:  Vitals:   01/25/23 0119 01/25/23 0501  BP:  95/68  Pulse:  68  Resp: 18 17  Temp: 36.6 C 36.5 C  SpO2: 98% 98%    Last Pain:  Vitals:   01/25/23 0501  TempSrc: Oral  PainSc:                  Lannie Fields

## 2023-01-25 NOTE — Progress Notes (Signed)
POSTPARTUM PROGRESS NOTE  Post Partum Day 1 Subjective:  Beth Willis is a 27 y.o. [id number] [redacted]w[redacted]d s/p pltcs.  No acute events overnight.  Still has urinary catheter.  She denies nausea or vomiting.  Pain is well controlled.  She has had flatus. She has not had bowel movement.  Lochia moderate.   Objective: Blood pressure 95/68, pulse 68, temperature 97.7 F (36.5 C), temperature source Oral, resp. rate 17, height 5\' 7"  (1.702 m), weight 105.4 kg, last menstrual period 05/09/2022, SpO2 98%, unknown if currently breastfeeding.  Physical Exam:  General: alert, cooperative and no distress Lochia:normal flow Chest: CTAB Heart: RRR no m/r/g Abdomen: +BS, soft, nontender,  Uterine Fundus: firm, dressing c/d/i DVT Evaluation: No calf swelling or tenderness Extremities: no edema  Recent Labs    01/23/23 0808 01/25/23 0626  HGB 10.2* 8.8*  HCT 31.1* 27.6*    Assessment/Plan:  ASSESSMENT: Beth Willis is a 27 y.o. [id number] [redacted]w[redacted]d s/p pltcs for active phase arrest. Hgb 8.8 today, asymptomatic, will start oral iron and monitor. Glucose 137 this morning but had just eaten so will plan on a fasting glucose tomorrow. Post-placental iud placed. Consented for circumcision. Breastfeeding. Plan for d/c tomorrow or next day.     LOS: 2 days   Silvano Bilis 01/25/2023, 9:30 AM

## 2023-01-25 NOTE — Clinical Social Work Maternal (Signed)
CLINICAL SOCIAL WORK MATERNAL/CHILD NOTE  Patient Details  Name: Beth Willis MRN: [id number] Date of Birth: [birth year]  Date:  01/25/2023  Clinical Social Worker Initiating Note:  Celso Sickle, [place] Date/Time: Initiated:  01/25/23/1134     Child's Name:  Beth Willis   Biological Parents:  Mother, Father (Father: Tonia Brooms "Trinna Post" Lynnea Ferrier)   Need for Interpreter:  None   Reason for Referral:  Behavioral Health Concerns, Current Substance Use/Substance Use During Pregnancy     Address:  [address] [place] [address]    Phone number:  [phone] (home)     Additional phone number:   Household Members/Support Persons (HM/SP):   Household Member/Support Person 1, Household Member/Support Person 2   HM/SP Name Relationship DOB or Age  HM/SP -1 Tonia Brooms "Alex" Lynnea Ferrier FOB    HM/SP -2 Heavyn Fortuno son 04/23/18  HM/SP -3        HM/SP -4        HM/SP -5        HM/SP -6        HM/SP -7        HM/SP -8          Natural Supports (not living in the home):  Parent   Professional Supports: None   Employment: Unemployed   Type of Work:     Education:  Engineer, agricultural   Homebound arranged:    Surveyor, quantity Resources:  [employer]   Other Resources:  Sales executive  , [employer]   Cultural/Religious Considerations Which May Impact Care:    Strengths:  Ability to meet basic needs  , Pediatrician chosen   Psychotropic Medications:         Pediatrician:    [employer]  Pediatrician List:   [facility] [place]    [facility]      Pediatrician Fax Number:    Risk Factors/Current Problems:  Substance Use  , Mental Health Concerns     Cognitive State:  Able to Concentrate  , Alert  , Goal Oriented  , Insightful  , Linear Thinking     Mood/Affect:  Calm  , Interested  , Comfortable  , Relaxed     CSW Assessment: CSW met with MOB at bedside to  complete psychosocial assessment, MOB's mother present. CSW introduced self and asked to speak with MOB privately, MOB's mother left the room. CSW explained reason for consult. MOB was welcoming, pleasant, and remained engaged during assessment. MOB reported that she resides with FOB and older son. MOB reported that she receives both [facility] and food stamps. MOB reported having all items needed to care for infant including a car seat, bassinet, and crib. CSW inquired about MOB's support system, MOB reported that FOB and her mom are supports.   CSW inquired about MOB's mental health history. MOB reported that she was diagnosed with postpartum depression and anxiety after having her son. MOB reported that symptoms presented one year after having her son. MOB reported that she took medication for depression for approximately 6-8 months and noted that she no longer needed it when she discontinued use. MOB reported that she continued to take anxiety medication until November 2023. MOB reported that she feels it stopped working. MOB denied any current symptoms of anxiety/depression. CSW asked if MOB needed any therapy resources, MOB denied needing any resources and shared that she did self  therapy which was helpful. CSW inquired about how MOB was feeling emotionally since giving birth, MOB reported that she was feeling fine now. MOB explained that prior to giving birth she was really emotional and attributed feelings to being "over it" and feeling "defeated" due to needed C-section and time away from older son. MOB emphasized feeling better now post delivery. CSW acknowledged, validated, and normalized MOB's feelings. MOB presented calm and did not demonstrate any acute mental health signs/symptoms. CSW assessed for safety, MOB denied SI, HI, and domestic violence.   CSW provided education regarding the baby blues period vs. perinatal mood disorders, discussed treatment and gave resources for mental health follow up if  concerns arise.  CSW recommends self-evaluation during the postpartum time period using the New Mom Checklist from Postpartum Progress and encouraged MOB to contact a medical professional if symptoms are noted at any time.    CSW provided review of Sudden Infant Death Syndrome (SIDS) precautions.    CSW informed MOB about the hospital drug screen policy due to documented substance use during pregnancy. MOB confirmed marijuana use during the first trimester due to sickness and inability to eat. MOB denied any additional substance use during pregnancy. CSW informed MOB that infant's UDS and CDS would be monitored and a CPS report would be made if warranted. MOB verbalized understanding and denied any questions.   CSW asked if any resources/supports were needed, MOB reported no needs.   CSW identifies no further need for intervention and no barriers to discharge at this time.   CSW Plan/Description:  Sudden Infant Death Syndrome (SIDS) Education, Perinatal Mood and Anxiety Disorder (PMADs) Education, Hospital Drug Screen Policy Information, CSW Will Continue to Monitor Umbilical Cord Tissue Drug Screen Results and Make Report if Warranted, No Further Intervention Required/No Barriers to Discharge    Antionette Poles, LCSW 01/25/2023, 11:37 AM

## 2023-01-25 NOTE — Lactation Note (Signed)
This note was copied from a baby's chart. Lactation Consultation Note  Patient Name: Beth Willis Today's Date: 01/25/2023 Age:27 hours Reason for consult: Early term 37-38.6wks (See Birth Parent's -MR: C/S delivery) Per Birth Parent, her feeding choice is breast and formula feeding infant. Birth Parent informed LC she did not latch infant to breast today  due to her working on walking and cathter was removed today. Birth Parent used the DEBP twice and expressed 5 mls of colostrum which LC explained is normal when pumping initially and encouraged Birth Parent to pump more to help stimulate and establish her milk supply when offering formula to pump if she doesn't latch infant to breast. Birth Parent . Birth Parent is planning to start back latching infant at the breast and feels infant does BF well most feedings when latch are 10 to 15 minutes in length. LC gave handout ,"Feeding Guidelines" and Birth Parent has supplement amounts to offer infant when latching or formula only. Birth Parent doesn't plan to continue to use DEBP. Birth Parent knows to feed infant by cues, on demand, 8 to 12+ times within 24 hours, skin to skin. Birth Parent knows to call RN/LC if she needs latch assistance.   Maternal Data    Feeding Mother's Current Feeding Choice: Breast Milk and Formula Nipple Type: Slow - flow  LATCH Score                    Lactation Tools Discussed/Used    Interventions Interventions: Education;Pace feeding;Breast feeding basics reviewed  Discharge    Consult Status Consult Status: Follow-up Date: 01/26/23 Follow-up type: In-patient    Frederico Hamman 01/25/2023, 6:41 PM

## 2023-01-26 ENCOUNTER — Other Ambulatory Visit: Payer: Self-pay | Admitting: Family Medicine

## 2023-01-26 ENCOUNTER — Other Ambulatory Visit: Payer: Self-pay | Admitting: Obstetrics and Gynecology

## 2023-01-26 LAB — GLUCOSE, CAPILLARY: Glucose-Capillary: 76 mg/dL (ref 70–99)

## 2023-01-26 MED ORDER — IBUPROFEN 600 MG PO TABS: 600.0000 mg | ORAL_TABLET | Freq: Three times a day (TID) | ORAL | 0 refills | Status: AC | PRN

## 2023-01-26 MED ORDER — OXYCODONE HCL 5 MG PO TABS: 5.0000 mg | ORAL_TABLET | Freq: Four times a day (QID) | ORAL | 0 refills | Status: DC | PRN

## 2023-01-26 NOTE — Lactation Note (Signed)
This note was copied from a baby's chart. Lactation Consultation Note  Patient Name: Beth Willis [id number] Date: 01/26/2023 Age:27 years  Reason for consult: Follow-up assessment;Early term 37-38.6wks;Exclusive pumping and bottle feeding;Maternal endocrine disorder  P2, [redacted]w[redacted]d, .15% weight loss  Mother states she has breast fed twice and baby latched well, then he wasn't interested. Mother started pumping and expressed colostrum twice and then didn't collect so she stopped pumping. Gave formula and continues to formula feed.   Mother plans to pump and give baby expressed milk by bottle. Has talked with [facility] about getting a pump in addition to her pump at home.  Discussed pumping 8/24 hours, discussed supply and demand and encouraged to do skin skin and allow baby to feed or attempt at the breast to promote milk production.   Mom made aware of O/P services, breastfeeding support groups, community resources, and our phone # for post-discharge questions.       Feeding Mother's Current Feeding Choice: Formula   Interventions Interventions: Arts administrator  Discharge Discharge Education: Engorgement and breast care;Warning signs for feeding baby Pump: Personal  Consult Status Consult Status: Complete Date: 01/26/23    Omar Person 01/26/2023, 1:50 PM

## 2023-01-26 NOTE — Progress Notes (Signed)
Pt. Called requested rx be sent to [facility] on [address]--rx sent in

## 2023-01-28 ENCOUNTER — Encounter: Payer: Self-pay | Admitting: Obstetrics & Gynecology

## 2023-01-28 MED ORDER — FUROSEMIDE 20 MG PO TABS: 20.0000 mg | ORAL_TABLET | Freq: Every day | ORAL | 0 refills | Status: AC

## 2023-01-30 ENCOUNTER — Encounter: Payer: Self-pay | Admitting: Obstetrics & Gynecology

## 2023-01-31 ENCOUNTER — Ambulatory Visit: Payer: Medicaid Other

## 2023-01-31 MED ORDER — FERROUS SULFATE 324 MG PO TBEC: 324.0000 mg | DELAYED_RELEASE_TABLET | ORAL | 0 refills | Status: AC

## 2023-02-02 ENCOUNTER — Other Ambulatory Visit: Payer: Self-pay | Admitting: Family Medicine

## 2023-02-02 ENCOUNTER — Encounter: Payer: Self-pay | Admitting: Obstetrics & Gynecology

## 2023-02-02 MED ORDER — OXYCODONE HCL 5 MG PO TABS: 5.0000 mg | ORAL_TABLET | Freq: Four times a day (QID) | ORAL | 0 refills | Status: AC | PRN

## 2023-02-03 ENCOUNTER — Encounter: Payer: Medicaid Other | Admitting: Obstetrics and Gynecology

## 2023-02-03 MED FILL — Levonorgestrel IUD 20 MCG/DAY (Initial) (52 MG Total): INTRAUTERINE | Qty: 1 | Status: AC

## 2023-02-10 ENCOUNTER — Encounter: Payer: Medicaid Other | Admitting: Obstetrics and Gynecology

## 2023-02-11 ENCOUNTER — Ambulatory Visit (INDEPENDENT_AMBULATORY_CARE_PROVIDER_SITE_OTHER): Payer: Medicaid Other

## 2023-02-11 DIAGNOSIS — Z98891 History of uterine scar from previous surgery: Secondary | ICD-10-CM

## 2023-02-11 DIAGNOSIS — F419 Anxiety disorder, unspecified: Secondary | ICD-10-CM

## 2023-02-11 DIAGNOSIS — F32A Depression, unspecified: Secondary | ICD-10-CM

## 2023-02-11 MED ORDER — ESCITALOPRAM OXALATE 10 MG PO TABS: 10.0000 mg | ORAL_TABLET | Freq: Every day | ORAL | 2 refills | Status: DC

## 2023-02-11 NOTE — Progress Notes (Signed)
Subjective:     Beth Willis is a 27 y.o. female who presents to the clinic 2 weeks status post classical cesarean section. Pt reports incision is healing well.      Objective:    LMP 05/09/2022 (Exact Date) Comment: approx 2 months pregnant General:  alert, well appearing, in no apparent distress  Incision:   healing well, no drainage, no erythema, no hernia, no seroma, no swelling, no dehiscence, incision well approximated     Assessment:    Doing well postoperatively.   Plan:    1. Continue any current medications. 2. Wound care discussed. 3. Follow up: As scheduled.   [name], CMA     [facility]  [id number] here for postpartum depression screen. Pt is currently 2 weeks postpartum. Pt reports doing sad and Overwhelmed and uncle was diagnosed with stage 4 pancreatic cancer and given 4-6 week to live.      Discussed with provider results of Edinburgh 14. Pt is advised that Medication has been sent into the pharmacy, Pt is advised on the instructions of the medication and follow up in 4 weeks.   Pt to follow up in 4 weeks per Dr.Pratt.

## 2023-02-17 ENCOUNTER — Encounter: Payer: Medicaid Other | Admitting: Family Medicine

## 2023-02-17 MED FILL — Lactated Ringer's Solution: INTRAVENOUS | Qty: 1000 | Status: AC

## 2023-02-18 ENCOUNTER — Other Ambulatory Visit: Payer: Self-pay | Admitting: Family Medicine

## 2023-02-24 ENCOUNTER — Encounter: Payer: Medicaid Other | Admitting: Obstetrics & Gynecology

## 2023-03-05 ENCOUNTER — Other Ambulatory Visit: Payer: Self-pay | Admitting: Family Medicine

## 2023-03-09 ENCOUNTER — Other Ambulatory Visit: Payer: Medicaid Other

## 2023-03-09 ENCOUNTER — Ambulatory Visit: Payer: Medicaid Other | Admitting: Obstetrics & Gynecology

## 2023-03-10 ENCOUNTER — Ambulatory Visit (INDEPENDENT_AMBULATORY_CARE_PROVIDER_SITE_OTHER): Payer: Medicaid Other | Admitting: Obstetrics & Gynecology

## 2023-03-10 ENCOUNTER — Other Ambulatory Visit: Payer: Medicaid Other

## 2023-03-10 ENCOUNTER — Encounter: Payer: Self-pay | Admitting: Obstetrics & Gynecology

## 2023-03-10 DIAGNOSIS — Z975 Presence of (intrauterine) contraceptive device: Secondary | ICD-10-CM | POA: Diagnosis not present

## 2023-03-10 DIAGNOSIS — Z8632 Personal history of gestational diabetes: Secondary | ICD-10-CM

## 2023-03-10 DIAGNOSIS — O24419 Gestational diabetes mellitus in pregnancy, unspecified control: Secondary | ICD-10-CM

## 2023-03-10 NOTE — Patient Instructions (Signed)
Beth Willis is a virtual mental health platform available to our patients   We can refer you to a local mental health provider or you can refer yourself to this online platform using the link below  [email or web address]

## 2023-03-10 NOTE — Progress Notes (Signed)
Post Partum Visit Note  Beth Willis is a 27 y.o. [phone] female who presents for a postpartum visit. She is 6 weeks postpartum following a primary cesarean section.  I have fully reviewed the prenatal and intrapartum course. The delivery was at 37 gestational weeks done for arrest of dilation after induction for suboptimal control of GDM.  Anesthesia: epidural. Postpartum course has been uncomplicated. Baby is doing well. Baby is feeding by bottle - Similac Neosure. Bleeding  Possible period . Bowel function is normal. Bladder function is normal. Patient is not sexually active. Contraception method is IUD, placed during cesarean section. Postpartum depression screening: positive.  The pregnancy intention screening data noted above was reviewed. Potential methods of contraception were discussed. The patient elected to proceed with No data recorded.   Edinburgh Postnatal Depression Scale - 03/10/23 1059       Edinburgh Postnatal Depression Scale:  In the Past 7 Days   I have been able to laugh and see the funny side of things. 0    I have looked forward with enjoyment to things. 0    I have blamed myself unnecessarily when things went wrong. 2    I have been anxious or worried for no good reason. 1    I have felt scared or panicky for no good reason. 2    Things have been getting on top of me. 0    I have been so unhappy that I have had difficulty sleeping. 0    I have felt sad or miserable. 1    I have been so unhappy that I have been crying. 1    The thought of harming myself has occurred to me. 0    Edinburgh Postnatal Depression Scale Total 7             Health Maintenance Due  Topic Date Due   FOOT EXAM  Never done   OPHTHALMOLOGY EXAM  Never done   Diabetic kidney evaluation - Urine ACR  04/22/2019   INFLUENZA VACCINE  01/27/2023   COVID-19 Vaccine (1 - 2023-24 season) Never done    The following portions of the patient's history were reviewed and updated as  appropriate: allergies, current medications, past family history, past medical history, past social history, past surgical history, and problem list.  Review of Systems Pertinent items noted in HPI and remainder of comprehensive ROS otherwise negative.  Objective:  BP 113/75   Pulse 83   Wt 217 lb (98.4 kg)   LMP 05/09/2022 (Exact Date) Comment: approx 2 months pregnant  Breastfeeding No   BMI 33.99 kg/m    General:  alert and no distress   Breasts:  not indicated  Lungs: clear to auscultation bilaterally  Heart:  regular rate and rhythm  Abdomen: soft, non-tender; bowel sounds normal; no masses,  no organomegaly   Wound well approximated incision  GU exam:  not indicated       Assessment:  Normal  postpartum exam.   Plan:   Essential components of care per ACOG recommendations:  1.  Mood and well being: Patient with negative depression screening today. Reviewed local resources for support.  - Patient tobacco use? No.   - hx of drug use? No.    2. Infant care and feeding:  -Patient currently breastmilk feeding? No.  -Social determinants of health (SDOH) reviewed in EPIC. No concerns.  3. Sexuality, contraception and birth spacing - Patient does not want a pregnancy in the next year.  Desired family size is 2 children.  - Reviewed reproductive life planning.    - Discussed birth spacing of 18 months  4. Sleep and fatigue -Encouraged family/partner/community support of 4 hrs of uninterrupted sleep to help with mood and fatigue  5. Physical Recovery  - Discussed patients delivery and complications.  - Patient had a C-section; no problems after delivery.  - Patient has urinary incontinence? No. - Patient is safe to resume physical and sexual activity  6.  Health Maintenance - HM due items addressed Yes - Last pap smear  Diagnosis  Date Value Ref Range Status  09/17/2020   Final   - Negative for intraepithelial lesion or malignancy (NILM)   Pap smear not done at  today's visit.  -Breast Cancer screening indicated? No.   7. Chronic Disease/Pregnancy Condition follow up: Gestational Diabetes - Postpartum 2 hr GTT done, will follow up results and manage accordingly. - PCP follow up   Jaynie Collins, MD Center for [facility]

## 2023-03-11 ENCOUNTER — Encounter: Payer: Self-pay | Admitting: Obstetrics & Gynecology

## 2023-03-11 LAB — GLUCOSE TOLERANCE, 2 HOURS
Glucose, 2 hour: 98 mg/dL (ref 70–139)
Glucose, GTT - Fasting: 91 mg/dL (ref 70–99)

## 2023-03-16 ENCOUNTER — Encounter: Payer: Self-pay | Admitting: Family Medicine

## 2023-03-16 ENCOUNTER — Ambulatory Visit (INDEPENDENT_AMBULATORY_CARE_PROVIDER_SITE_OTHER): Payer: Medicaid Other | Admitting: Family Medicine

## 2023-03-16 VITALS — BP 129/85 | HR 105

## 2023-03-16 DIAGNOSIS — Z30430 Encounter for insertion of intrauterine contraceptive device: Secondary | ICD-10-CM | POA: Diagnosis not present

## 2023-03-16 DIAGNOSIS — T8332XA Displacement of intrauterine contraceptive device, initial encounter: Secondary | ICD-10-CM

## 2023-03-16 MED ORDER — LEVONORGESTREL 20 MCG/DAY IU IUD
1.0000 | INTRAUTERINE_SYSTEM | Freq: Once | INTRAUTERINE | Status: AC
Start: 2023-03-16 — End: 2023-03-16
  Administered 2023-03-16: 1 via INTRAUTERINE

## 2023-03-16 NOTE — Progress Notes (Signed)
Pt here for problem visit today states   IUD fell out yesterday while taking out her tampon wants Mirena replaced.   Pt had intercourse Sunday.

## 2023-03-16 NOTE — Progress Notes (Signed)
    GYNECOLOGY OFFICE PROCEDURE NOTE  Beth Willis is a 27 y.o. [phone] here for Mirena IUD insertion. No GYN concerns.  Last pap smear was on 09/17/2020 and was normal. Had an IUD placed post-placentally. Was removing a tampon yesterday and pulled her IUD out.  IUD Insertion Procedure Note Patient identified, informed consent performed, consent signed.   Discussed risks of irregular bleeding, cramping, infection, malpositioning or misplacement of the IUD outside the uterus which may require further procedure such as laparoscopy. Also discussed >99% contraception efficacy, increased risk of ectopic pregnancy with failure of method.   Emphasized that this did not protect against STIs, condoms recommended during all sexual encounters. Time out was performed.  Chaperone present.  Urine pregnancy test negative.  Speculum placed in the vagina.  Cervix visualized.  Cleaned with Betadine x 2.  Grasped anteriorly with a single tooth tenaculum.  Uterus sounded to 10 cm.  Mirena IUD placed per manufacturer's recommendations.  Strings trimmed to 3 cm. Tenaculum was removed, good hemostasis noted.  Patient tolerated procedure well.   Patient was given post-procedure instructions.  She was advised to have backup contraception for one week.  Patient was also asked to check IUD strings periodically and follow up in 4 weeks for IUD check.   Reva Bores, MD 03/16/2023 11:37 AM

## 2023-03-30 ENCOUNTER — Encounter: Payer: Self-pay | Admitting: Family Medicine

## 2023-04-01 MED ORDER — SERTRALINE HCL 50 MG PO TABS: 50.0000 mg | ORAL_TABLET | Freq: Every day | ORAL | 3 refills | Status: DC

## 2023-04-05 ENCOUNTER — Encounter: Payer: Self-pay | Admitting: *Deleted

## 2023-04-06 ENCOUNTER — Telehealth: Payer: Self-pay | Admitting: *Deleted

## 2023-04-06 NOTE — Telephone Encounter (Signed)
Pt called in to talk about bleeding and cramping with IUD. Discussed the normalcy of irregular bleeding that can last about 3-6 months, and she can take Ibuprofen 600mg  every 6 hours or 800mg  every 8, which would help both with the bleeding and cramping. Advised to take it scheduled for the next few days to see if it settles things down, and if the pain does get worse to reach back out as we will bring her in to check the placement.

## 2023-04-20 ENCOUNTER — Ambulatory Visit: Payer: Medicaid Other | Admitting: Family Medicine

## 2023-05-04 ENCOUNTER — Encounter: Payer: Self-pay | Admitting: Family Medicine

## 2023-05-04 ENCOUNTER — Ambulatory Visit (INDEPENDENT_AMBULATORY_CARE_PROVIDER_SITE_OTHER): Payer: Medicaid Other | Admitting: Family Medicine

## 2023-05-04 VITALS — BP 127/88 | HR 96 | Temp 97.9°F | Wt 215.0 lb

## 2023-05-04 DIAGNOSIS — Z30431 Encounter for routine checking of intrauterine contraceptive device: Secondary | ICD-10-CM

## 2023-05-04 DIAGNOSIS — R102 Pelvic and perineal pain: Secondary | ICD-10-CM

## 2023-05-04 DIAGNOSIS — N611 Abscess of the breast and nipple: Secondary | ICD-10-CM

## 2023-05-04 LAB — POCT URINALYSIS DIPSTICK
Bilirubin, UA: NEGATIVE
Blood, UA: NEGATIVE
Glucose, UA: NEGATIVE
Ketones, UA: NEGATIVE
Leukocytes, UA: NEGATIVE
Nitrite, UA: NEGATIVE
Protein, UA: NEGATIVE

## 2023-05-04 LAB — POCT URINE PREGNANCY: Preg Test, Ur: NEGATIVE

## 2023-05-04 MED ORDER — DOXYCYCLINE HYCLATE 100 MG PO CAPS
100.0000 mg | ORAL_CAPSULE | Freq: Two times a day (BID) | ORAL | 0 refills | Status: AC
Start: 2023-05-04 — End: ?

## 2023-05-04 NOTE — Progress Notes (Signed)
Pt here for IUD string check and breast concern.  Notes :Pelvic pain and pain during intercourse, cramping every other day , no bleeding today Right breast pain, more so under breast area is warm to touch states red bump appeared in last 24 hrs .   Pt mentioned if it may be a clogged duct?

## 2023-05-04 NOTE — Progress Notes (Signed)
    Subjective:    Patient ID: Beth Willis is a 27 y.o. female presenting with Breast Problem  on 05/04/2023  HPI: 24 hours of breast pain and erythema and warmth. Not breast feeding.  Also having some hemorrhoids issues. Taking Anusol. Having some cramping with IUD and before.  Review of Systems  Constitutional:  Negative for chills and fever.  Respiratory:  Negative for shortness of breath.   Cardiovascular:  Negative for chest pain.  Gastrointestinal:  Negative for abdominal pain, nausea and vomiting.  Genitourinary:  Negative for dysuria.  Skin:  Negative for rash.      Objective:    BP 127/88   Pulse 96   Temp 97.9 F (36.6 C) (Oral)   Wt 215 lb (97.5 kg)   LMP  (Approximate) Comment: IUD  Breastfeeding No   BMI 33.67 kg/m  Physical Exam Exam conducted with a chaperone present.  Constitutional:      General: She is not in acute distress.    Appearance: She is well-developed.  HENT:     Head: Normocephalic and atraumatic.  Eyes:     General: No scleral icterus. Cardiovascular:     Rate and Rhythm: Normal rate.  Pulmonary:     Effort: Pulmonary effort is normal.  Chest:    Abdominal:     Palpations: Abdomen is soft.  Genitourinary:    General: Normal vulva.     Vagina: Normal.     Cervix: Normal.     Comments: IUD strings noted Musculoskeletal:     Cervical back: Neck supple.  Skin:    General: Skin is warm and dry.  Neurological:     Mental Status: She is alert and oriented to person, place, and time.    UPT negative     Component Ref Range & Units 14:55 (05/04/23)  Color, UA   Clarity, UA   Glucose, UA Negative Negative  Bilirubin, UA negative  Ketones, UA Negative  Spec Grav, UA   Blood, UA Negative  pH, UA   Protein, UA Negative Negative  Urobilinogen, UA   Nitrite, UA Negative  Leukocytes, UA Negative Negative         Assessment & Plan:  Pelvic pain - NSAIDS with cramping. - Plan: POCT Urinalysis Dipstick, POCT  urine pregnancy  IUD check up  Boil, breast - warm compresses, Abx, if not improving we will need imaging - Plan: doxycycline (VIBRAMYCIN) 100 MG capsule   No follow-ups on file.  Reva Bores, MD 05/04/2023 2:59 PM

## 2023-06-06 ENCOUNTER — Encounter: Payer: Self-pay | Admitting: Family Medicine

## 2023-06-06 DIAGNOSIS — F419 Anxiety disorder, unspecified: Secondary | ICD-10-CM

## 2023-06-07 ENCOUNTER — Other Ambulatory Visit: Payer: Self-pay | Admitting: Family Medicine

## 2023-06-07 MED ORDER — SERTRALINE HCL 100 MG PO TABS
50.0000 mg | ORAL_TABLET | Freq: Every day | ORAL | 1 refills | Status: DC
Start: 2023-06-07 — End: 2024-02-15

## 2023-12-24 ENCOUNTER — Other Ambulatory Visit: Payer: Self-pay | Admitting: Family Medicine

## 2023-12-24 DIAGNOSIS — F32A Depression, unspecified: Secondary | ICD-10-CM

## 2024-02-15 ENCOUNTER — Other Ambulatory Visit: Payer: Self-pay | Admitting: *Deleted

## 2024-02-15 DIAGNOSIS — F419 Anxiety disorder, unspecified: Secondary | ICD-10-CM

## 2024-02-15 MED ORDER — SERTRALINE HCL 100 MG PO TABS: 100.0000 mg | ORAL_TABLET | Freq: Every day | ORAL | 1 refills | Status: AC

## 2024-02-15 MED ORDER — SERTRALINE HCL 100 MG PO TABS
100.0000 mg | ORAL_TABLET | Freq: Every day | ORAL | 1 refills | Status: DC
Start: 2024-02-15 — End: 2024-02-15

## 2024-02-15 NOTE — Progress Notes (Signed)
RX sent to wrong pharmacy.

## 2024-06-02 ENCOUNTER — Encounter: Payer: Self-pay | Admitting: Family Medicine

## 2024-06-02 ENCOUNTER — Encounter: Payer: Self-pay | Admitting: Obstetrics & Gynecology

## 2024-06-12 ENCOUNTER — Ambulatory Visit: Payer: Self-pay | Admitting: Podiatry

## 2024-07-03 ENCOUNTER — Ambulatory Visit: Payer: Self-pay | Admitting: Podiatry

## 2024-07-12 ENCOUNTER — Ambulatory Visit: Admitting: Family Medicine

## 2024-07-12 ENCOUNTER — Encounter: Payer: Self-pay | Admitting: Family Medicine

## 2024-07-12 NOTE — Progress Notes (Signed)
 Patient did not keep appointment today. She may call to reschedule.

## 2024-07-17 ENCOUNTER — Encounter: Payer: Self-pay | Admitting: *Deleted

## 2024-07-17 ENCOUNTER — Other Ambulatory Visit: Payer: Self-pay

## 2024-07-17 ENCOUNTER — Emergency Department
Admission: EM | Admit: 2024-07-17 | Discharge: 2024-07-17 | Disposition: A | Discharge status: Home / Self Care | Payer: Self-pay | Attending: Emergency Medicine | Admitting: Emergency Medicine

## 2024-07-17 ENCOUNTER — Ambulatory Visit: Admitting: Podiatry

## 2024-07-17 DIAGNOSIS — H66013 Acute suppurative otitis media with spontaneous rupture of ear drum, bilateral: Secondary | ICD-10-CM | POA: Insufficient documentation

## 2024-07-17 MED ORDER — CIPROFLOXACIN HCL 500 MG PO TABS
500.0000 mg | ORAL_TABLET | Freq: Once | ORAL | Status: AC
  Administered 2024-07-17: 500 mg via ORAL
  Filled 2024-07-17: qty 1

## 2024-07-17 MED ORDER — CIPROFLOXACIN HCL 500 MG PO TABS: 500.0000 mg | ORAL_TABLET | Freq: Two times a day (BID) | ORAL | 0 refills | Status: AC

## 2024-07-17 MED ORDER — IBUPROFEN 600 MG PO TABS
600.0000 mg | ORAL_TABLET | Freq: Once | ORAL | Status: AC
  Administered 2024-07-17: 600 mg via ORAL
  Filled 2024-07-17: qty 1

## 2024-07-17 NOTE — Discharge Instructions (Signed)
 You have been diagnosed with bilateral acute suppurative otitis media.  These do not apply drops in your ears.  Please take ciprofloxacin  1 tablet by mouth every 12 hours for 10 days.  Please go or call [facility] to make an appointment for a follow-up.  Please use GoodRx card to buy your prescriptions.  Please come back to ED or go to your PCP if you have new symptoms or symptoms worsen.

## 2024-07-17 NOTE — ED Provider Notes (Signed)
 "  [facility] Provider Note    Event Date/Time   First MD Initiated Contact with Patient 07/17/24 1919     (approximate)   History   Ear Drainage    HPI  Beth Willis is a 29 y.o. female    with no significant past medical history who presents to the ED complaining of bilateral ear drainage   . According to the patient, symptoms started a week ago with bilateral purulent ear drainage.  Patient endorses having chills, headache in the frontal area.  Patient denies fever, nauseas.  Patient is here by herself.  Patient does not have insurance.  Patient use it eardrops over-the-counter in the last days.  She has an appointment with ENT, Dr. Blair for her insurance will not cover the appointment and she will not be able to make it.     Patient Active Problem List   Diagnosis Date Noted   IUD (intrauterine device) in place 01/24/2023   Marijuana use 09/16/2022   Anxiety and depression 05/10/2019   ADD (attention deficit disorder) 10/18/2017     Physical Exam   Triage Vital Signs: ED Triage Vitals  Encounter Vitals Group     BP 07/17/24 1636 138/84     Girls Systolic BP Percentile --      Girls Diastolic BP Percentile --      Boys Systolic BP Percentile --      Boys Diastolic BP Percentile --      Pulse Rate 07/17/24 1636 81     Resp 07/17/24 1636 20     Temp 07/17/24 1636 98.4 F (36.9 C)     Temp Source 07/17/24 1636 Oral     SpO2 07/17/24 1636 98 %     Weight 07/17/24 1635 200 lb (90.7 kg)     Height 07/17/24 1635 5' 7 (1.702 m)     Head Circumference --      Peak Flow --      Pain Score 07/17/24 1634 10     Pain Loc --      Pain Education --      Exclude from Growth Chart --     Most recent vital signs: Vitals:   07/17/24 1636  BP: 138/84  Pulse: 81  Resp: 20  Temp: 98.4 F (36.9 C)  SpO2: 98%     Physical Exam Vitals and nursing note reviewed.  During triage vital signs were normal, patient was afebrile.  General:           Awake, no distress.  Ears: Left ear: Presence of clear ear fluid.  No erythema in the ear canal.  No tenderness in the tragus Right ear: Presence of purulent secretions, presence of perforation of the tympanic membrane.  No erythema in the ear canal.  No tenderness in the tragus. CV:                  Good peripheral perfusion. Regular rate and rhythm. Resp:               Normal effort. no tachypnea.Equal breath sounds bilaterally.  Abd:                 No distention.  Soft nontender           ED Results / Procedures / Treatments   Labs (all labs ordered are listed, but only abnormal results are displayed) Labs Reviewed - No data to display   PROCEDURES:  Critical Care performed:  Procedures   MEDICATIONS ORDERED IN ED: Medications  ciprofloxacin  (CIPRO ) tablet 500 mg (has no administration in time range)  ibuprofen  (ADVIL ) tablet 600 mg (has no administration in time range)   Clinical Course as of 07/17/24 2026  Tue Jul 17, 2024  2025 I did consult pharmacy, patient is allergic to penicillin or amoxicillin they advised to give ciprofloxacin  [AE]    Clinical Course User Index [AE] Janit Kast, PA-C    IMPRESSION / MDM / ASSESSMENT AND PLAN / ED COURSE  I reviewed the triage vital signs and the nursing notes.  Differential diagnosis includes, but is not limited to, bilateral suppurative otitis, otitis externa, unlikely foreign body.  Patient's presentation is most consistent with acute, uncomplicated illness.   Beth Willis is a 29 y.o., female who presents today with history of bilateral ear drainage.  See HPI for further information.  On a physical exam vital signs were normal, patient was afebrile.  Right otoscopy presence of purulent secretions, tympanic membrane is perforated, no erythema in the ear canal, no tenderness in the tragus.  Left otoscopy presence of clear fluid.  No erythema in the ear canal, no tenderness  of the tragus.  Cardiopulmonary is  clear, no wheezing or crackles no rales.  Rest of physical exam was normal. Plan Ciprofloxacin  Ibuprofen  [facility] for prescription Patient's diagnosis is consistent with bilateral superactive otitis. I did review the patient's allergies and medications.The patient is in stable and satisfactory condition for discharge home  Patient will be discharged home with prescriptions for ciprofloxacin . [facility], ENT as needed or otherwise directed. Patient is given ED precautions to return to the ED for any worsening or new symptoms.  GoodRx card will be provided.  I did advise patient not to apply drops in her ears. Discussed plan of care with patient, answered all of patient's questions, patient agreeable to plan of care. Advised patient to take medications according to the instructions on the label. Discussed possible side effects of new medications. Patient verbalized understanding.  FINAL CLINICAL IMPRESSION(S) / ED DIAGNOSES   Final diagnoses:  Acute suppurative otitis media of both ears with spontaneous rupture of tympanic membranes, recurrence not specified     Rx / DC Orders   ED Discharge Orders          Ordered    ciprofloxacin  (CIPRO ) 500 MG tablet  2 times daily        07/17/24 2025             Note:  This document was prepared using Dragon voice recognition software and may include unintentional dictation errors.   Janit Kast, PA-C 07/17/24 2026  "

## 2024-07-17 NOTE — ED Triage Notes (Addendum)
 Pt ambulatory to triage. Pt has drainage from both ears.  Hx chronic ear infections.  Pt alert.  No fever  taking otc meds without relief.

## 2024-08-03 ENCOUNTER — Encounter: Payer: Self-pay | Admitting: Obstetrics & Gynecology

## 2024-08-03 ENCOUNTER — Encounter: Payer: Self-pay | Admitting: Advanced Practice Midwife

## 2024-08-03 ENCOUNTER — Encounter: Payer: Self-pay | Admitting: Family Medicine

## 2024-08-03 MED ORDER — CIPROFLOXACIN HCL 500 MG PO TABS: 500.0000 mg | ORAL_TABLET | Freq: Two times a day (BID) | ORAL | 0 refills | Status: AC
